# Patient Record
Sex: Female | Born: 1965 | Race: White | Hispanic: No | Marital: Married | Smoking: Former smoker
Health system: Southern US, Community
[De-identification: ages and names within clinical notes are randomized; demographics above are authoritative.]

## PROBLEM LIST (undated history)

## (undated) DIAGNOSIS — E119 Type 2 diabetes mellitus without complications: Secondary | ICD-10-CM

## (undated) DIAGNOSIS — F419 Anxiety disorder, unspecified: Secondary | ICD-10-CM

## (undated) DIAGNOSIS — E785 Hyperlipidemia, unspecified: Secondary | ICD-10-CM

## (undated) DIAGNOSIS — K219 Gastro-esophageal reflux disease without esophagitis: Secondary | ICD-10-CM

## (undated) DIAGNOSIS — I10 Essential (primary) hypertension: Secondary | ICD-10-CM

## (undated) DIAGNOSIS — O24419 Gestational diabetes mellitus in pregnancy, unspecified control: Secondary | ICD-10-CM

## (undated) HISTORY — PX: ABDOMINAL HYSTERECTOMY: SHX81

## (undated) HISTORY — DX: Anxiety disorder, unspecified: F41.9

## (undated) HISTORY — DX: Gestational diabetes mellitus in pregnancy, unspecified control: O24.419

## (undated) HISTORY — DX: Hyperlipidemia, unspecified: E78.5

## (undated) HISTORY — DX: Gastro-esophageal reflux disease without esophagitis: K21.9

---

## 1991-10-02 DIAGNOSIS — O24419 Gestational diabetes mellitus in pregnancy, unspecified control: Secondary | ICD-10-CM

## 1991-10-02 HISTORY — DX: Gestational diabetes mellitus in pregnancy, unspecified control: O24.419

## 1997-10-01 DIAGNOSIS — I10 Essential (primary) hypertension: Secondary | ICD-10-CM

## 1997-10-01 HISTORY — PX: ABDOMINAL HYSTERECTOMY: SHX81

## 1997-10-01 HISTORY — DX: Essential (primary) hypertension: I10

## 2005-10-12 ENCOUNTER — Emergency Department (HOSPITAL_COMMUNITY): Admission: EM | Admit: 2005-10-12 | Discharge: 2005-10-12 | Payer: Self-pay | Admitting: Emergency Medicine

## 2005-10-31 ENCOUNTER — Emergency Department (HOSPITAL_COMMUNITY): Admission: EM | Admit: 2005-10-31 | Discharge: 2005-10-31 | Payer: Self-pay | Admitting: Emergency Medicine

## 2005-11-19 ENCOUNTER — Emergency Department (HOSPITAL_COMMUNITY): Admission: EM | Admit: 2005-11-19 | Discharge: 2005-11-19 | Payer: Self-pay | Admitting: Emergency Medicine

## 2005-11-20 ENCOUNTER — Inpatient Hospital Stay (HOSPITAL_COMMUNITY): Admission: RE | Admit: 2005-11-20 | Discharge: 2005-11-22 | Payer: Self-pay | Admitting: Psychiatry

## 2005-11-20 ENCOUNTER — Ambulatory Visit: Payer: Self-pay | Admitting: Psychiatry

## 2005-12-03 ENCOUNTER — Inpatient Hospital Stay (HOSPITAL_COMMUNITY): Admission: RE | Admit: 2005-12-03 | Discharge: 2005-12-05 | Payer: Self-pay | Admitting: Psychiatry

## 2005-12-03 ENCOUNTER — Emergency Department (HOSPITAL_COMMUNITY): Admission: EM | Admit: 2005-12-03 | Discharge: 2005-12-03 | Payer: Self-pay | Admitting: Emergency Medicine

## 2005-12-04 ENCOUNTER — Ambulatory Visit: Payer: Self-pay | Admitting: Psychiatry

## 2006-01-18 ENCOUNTER — Emergency Department (HOSPITAL_COMMUNITY): Admission: EM | Admit: 2006-01-18 | Discharge: 2006-01-18 | Payer: Self-pay | Admitting: Emergency Medicine

## 2006-02-17 ENCOUNTER — Emergency Department (HOSPITAL_COMMUNITY): Admission: EM | Admit: 2006-02-17 | Discharge: 2006-02-17 | Payer: Self-pay | Admitting: Emergency Medicine

## 2006-02-22 ENCOUNTER — Emergency Department (HOSPITAL_COMMUNITY): Admission: EM | Admit: 2006-02-22 | Discharge: 2006-02-22 | Payer: Self-pay | Admitting: Emergency Medicine

## 2006-02-23 ENCOUNTER — Emergency Department (HOSPITAL_COMMUNITY): Admission: EM | Admit: 2006-02-23 | Discharge: 2006-02-24 | Payer: Self-pay | Admitting: Emergency Medicine

## 2006-02-24 ENCOUNTER — Inpatient Hospital Stay (HOSPITAL_COMMUNITY): Admission: EM | Admit: 2006-02-24 | Discharge: 2006-02-27 | Payer: Self-pay | Admitting: Emergency Medicine

## 2006-05-14 ENCOUNTER — Emergency Department (HOSPITAL_COMMUNITY): Admission: EM | Admit: 2006-05-14 | Discharge: 2006-05-14 | Payer: Self-pay | Admitting: Emergency Medicine

## 2006-05-17 ENCOUNTER — Emergency Department (HOSPITAL_COMMUNITY): Admission: EM | Admit: 2006-05-17 | Discharge: 2006-05-17 | Payer: Self-pay | Admitting: Emergency Medicine

## 2006-06-01 ENCOUNTER — Ambulatory Visit: Payer: Self-pay | Admitting: Psychiatry

## 2006-06-01 ENCOUNTER — Inpatient Hospital Stay (HOSPITAL_COMMUNITY): Admission: AD | Admit: 2006-06-01 | Discharge: 2006-06-03 | Payer: Self-pay | Admitting: Psychiatry

## 2006-07-02 ENCOUNTER — Inpatient Hospital Stay (HOSPITAL_COMMUNITY): Admission: EM | Admit: 2006-07-02 | Discharge: 2006-07-04 | Payer: Self-pay | Admitting: Emergency Medicine

## 2007-03-03 ENCOUNTER — Ambulatory Visit: Payer: Self-pay | Admitting: Family Medicine

## 2007-03-04 ENCOUNTER — Ambulatory Visit: Payer: Self-pay | Admitting: *Deleted

## 2007-05-05 ENCOUNTER — Ambulatory Visit: Payer: Self-pay | Admitting: Family Medicine

## 2007-05-05 LAB — CONVERTED CEMR LAB: Uric Acid, Serum: 6.4 mg/dL (ref 2.4–7.0)

## 2007-07-03 ENCOUNTER — Emergency Department (HOSPITAL_COMMUNITY): Admission: EM | Admit: 2007-07-03 | Discharge: 2007-07-03 | Payer: Self-pay | Admitting: Emergency Medicine

## 2007-11-13 ENCOUNTER — Ambulatory Visit: Payer: Self-pay | Admitting: Family Medicine
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
## 2007-12-24 ENCOUNTER — Emergency Department (HOSPITAL_COMMUNITY): Admission: EM | Admit: 2007-12-24 | Discharge: 2007-12-25 | Payer: Self-pay | Admitting: Emergency Medicine

## 2008-01-13 ENCOUNTER — Encounter (INDEPENDENT_AMBULATORY_CARE_PROVIDER_SITE_OTHER): Payer: Self-pay | Admitting: Family Medicine

## 2008-01-13 ENCOUNTER — Ambulatory Visit: Payer: Self-pay | Admitting: Internal Medicine

## 2008-01-13 LAB — CONVERTED CEMR LAB
Hep A Total Ab: NEGATIVE
Hep B S Ab: NEGATIVE

## 2008-05-17 ENCOUNTER — Emergency Department (HOSPITAL_COMMUNITY): Admission: EM | Admit: 2008-05-17 | Discharge: 2008-05-17 | Payer: Self-pay | Admitting: Emergency Medicine

## 2008-07-20 ENCOUNTER — Emergency Department (HOSPITAL_COMMUNITY): Admission: EM | Admit: 2008-07-20 | Discharge: 2008-07-20 | Payer: Self-pay | Admitting: Emergency Medicine

## 2008-08-12 ENCOUNTER — Emergency Department (HOSPITAL_COMMUNITY): Admission: EM | Admit: 2008-08-12 | Discharge: 2008-08-12 | Payer: Self-pay | Admitting: Emergency Medicine

## 2008-10-22 ENCOUNTER — Ambulatory Visit: Payer: Self-pay | Admitting: Interventional Radiology

## 2008-10-22 ENCOUNTER — Emergency Department (HOSPITAL_BASED_OUTPATIENT_CLINIC_OR_DEPARTMENT_OTHER): Admission: EM | Admit: 2008-10-22 | Discharge: 2008-10-22 | Payer: Self-pay | Admitting: Emergency Medicine

## 2009-02-12 ENCOUNTER — Emergency Department (HOSPITAL_BASED_OUTPATIENT_CLINIC_OR_DEPARTMENT_OTHER): Admission: EM | Admit: 2009-02-12 | Discharge: 2009-02-12 | Payer: Self-pay | Admitting: Emergency Medicine

## 2009-03-04 ENCOUNTER — Ambulatory Visit: Payer: Self-pay | Admitting: Family Medicine

## 2009-04-15 ENCOUNTER — Ambulatory Visit: Payer: Self-pay | Admitting: Family Medicine

## 2009-06-20 ENCOUNTER — Ambulatory Visit: Payer: Self-pay | Admitting: Family Medicine

## 2009-06-20 LAB — CONVERTED CEMR LAB
ALT: 65 units/L — ABNORMAL HIGH (ref 0–35)
CO2: 26 meq/L (ref 19–32)
Calcium: 9.8 mg/dL (ref 8.4–10.5)
Chloride: 101 meq/L (ref 96–112)
Cholesterol: 265 mg/dL — ABNORMAL HIGH (ref 0–200)
Glucose, Bld: 122 mg/dL — ABNORMAL HIGH (ref 70–99)
Sodium: 140 meq/L (ref 135–145)
Total Bilirubin: 1.2 mg/dL (ref 0.3–1.2)
Total Protein: 7.5 g/dL (ref 6.0–8.3)
Triglycerides: 439 mg/dL — ABNORMAL HIGH (ref <150)

## 2009-07-15 ENCOUNTER — Encounter (INDEPENDENT_AMBULATORY_CARE_PROVIDER_SITE_OTHER): Payer: Self-pay | Admitting: Family Medicine

## 2009-07-15 ENCOUNTER — Ambulatory Visit: Payer: Self-pay | Admitting: Family Medicine

## 2009-07-15 LAB — CONVERTED CEMR LAB
ALT: 36 units/L — ABNORMAL HIGH (ref 0–35)
AST: 31 units/L (ref 0–37)
Alkaline Phosphatase: 86 units/L (ref 39–117)
Bilirubin, Direct: 0.1 mg/dL (ref 0.0–0.3)
Indirect Bilirubin: 0.7 mg/dL (ref 0.0–0.9)
Total Bilirubin: 0.8 mg/dL (ref 0.3–1.2)

## 2009-07-25 ENCOUNTER — Ambulatory Visit (HOSPITAL_COMMUNITY): Admission: RE | Admit: 2009-07-25 | Discharge: 2009-07-25 | Payer: Self-pay | Admitting: Internal Medicine

## 2009-08-01 ENCOUNTER — Encounter: Admission: RE | Admit: 2009-08-01 | Discharge: 2009-08-01 | Payer: Self-pay | Admitting: Internal Medicine

## 2009-08-03 ENCOUNTER — Inpatient Hospital Stay (HOSPITAL_COMMUNITY): Admission: EM | Admit: 2009-08-03 | Discharge: 2009-08-06 | Payer: Self-pay | Admitting: Emergency Medicine

## 2009-09-27 ENCOUNTER — Ambulatory Visit: Payer: Self-pay | Admitting: Family Medicine

## 2009-10-10 ENCOUNTER — Ambulatory Visit: Payer: Self-pay | Admitting: Family Medicine

## 2009-11-29 ENCOUNTER — Ambulatory Visit: Payer: Self-pay | Admitting: Family Medicine

## 2009-11-29 LAB — CONVERTED CEMR LAB
Alkaline Phosphatase: 81 units/L (ref 39–117)
BUN: 10 mg/dL (ref 6–23)
CO2: 21 meq/L (ref 19–32)
Cholesterol: 286 mg/dL — ABNORMAL HIGH (ref 0–200)
Creatinine, Ser: 0.55 mg/dL (ref 0.40–1.20)
Glucose, Bld: 110 mg/dL — ABNORMAL HIGH (ref 70–99)
HDL: 49 mg/dL (ref >39)
LDL Cholesterol: 172 mg/dL — ABNORMAL HIGH (ref 0–99)
Sodium: 138 meq/L (ref 135–145)
Total Bilirubin: 0.7 mg/dL (ref 0.3–1.2)
Total Protein: 7.1 g/dL (ref 6.0–8.3)
Triglycerides: 326 mg/dL — ABNORMAL HIGH (ref <150)
VLDL: 65 mg/dL — ABNORMAL HIGH (ref 0–40)

## 2009-12-13 ENCOUNTER — Ambulatory Visit: Payer: Self-pay | Admitting: Family Medicine

## 2010-04-10 ENCOUNTER — Emergency Department (HOSPITAL_BASED_OUTPATIENT_CLINIC_OR_DEPARTMENT_OTHER): Admission: EM | Admit: 2010-04-10 | Discharge: 2010-04-10 | Payer: Self-pay | Admitting: Emergency Medicine

## 2010-04-24 ENCOUNTER — Emergency Department (HOSPITAL_BASED_OUTPATIENT_CLINIC_OR_DEPARTMENT_OTHER): Admission: EM | Admit: 2010-04-24 | Discharge: 2010-04-24 | Payer: Self-pay | Admitting: Emergency Medicine

## 2010-05-17 ENCOUNTER — Other Ambulatory Visit: Payer: Self-pay | Admitting: Emergency Medicine

## 2010-05-17 ENCOUNTER — Inpatient Hospital Stay (HOSPITAL_COMMUNITY): Admission: EM | Admit: 2010-05-17 | Discharge: 2010-05-20 | Payer: Self-pay | Admitting: Internal Medicine

## 2010-09-23 IMAGING — CR DG CHEST 2V
2 series · 2 of 2 positions shown · non-contrast
Comparison: 07/02/2006

CLINICAL DATA: Cough and fever

CHEST - 2 VIEW

[w chest pa]
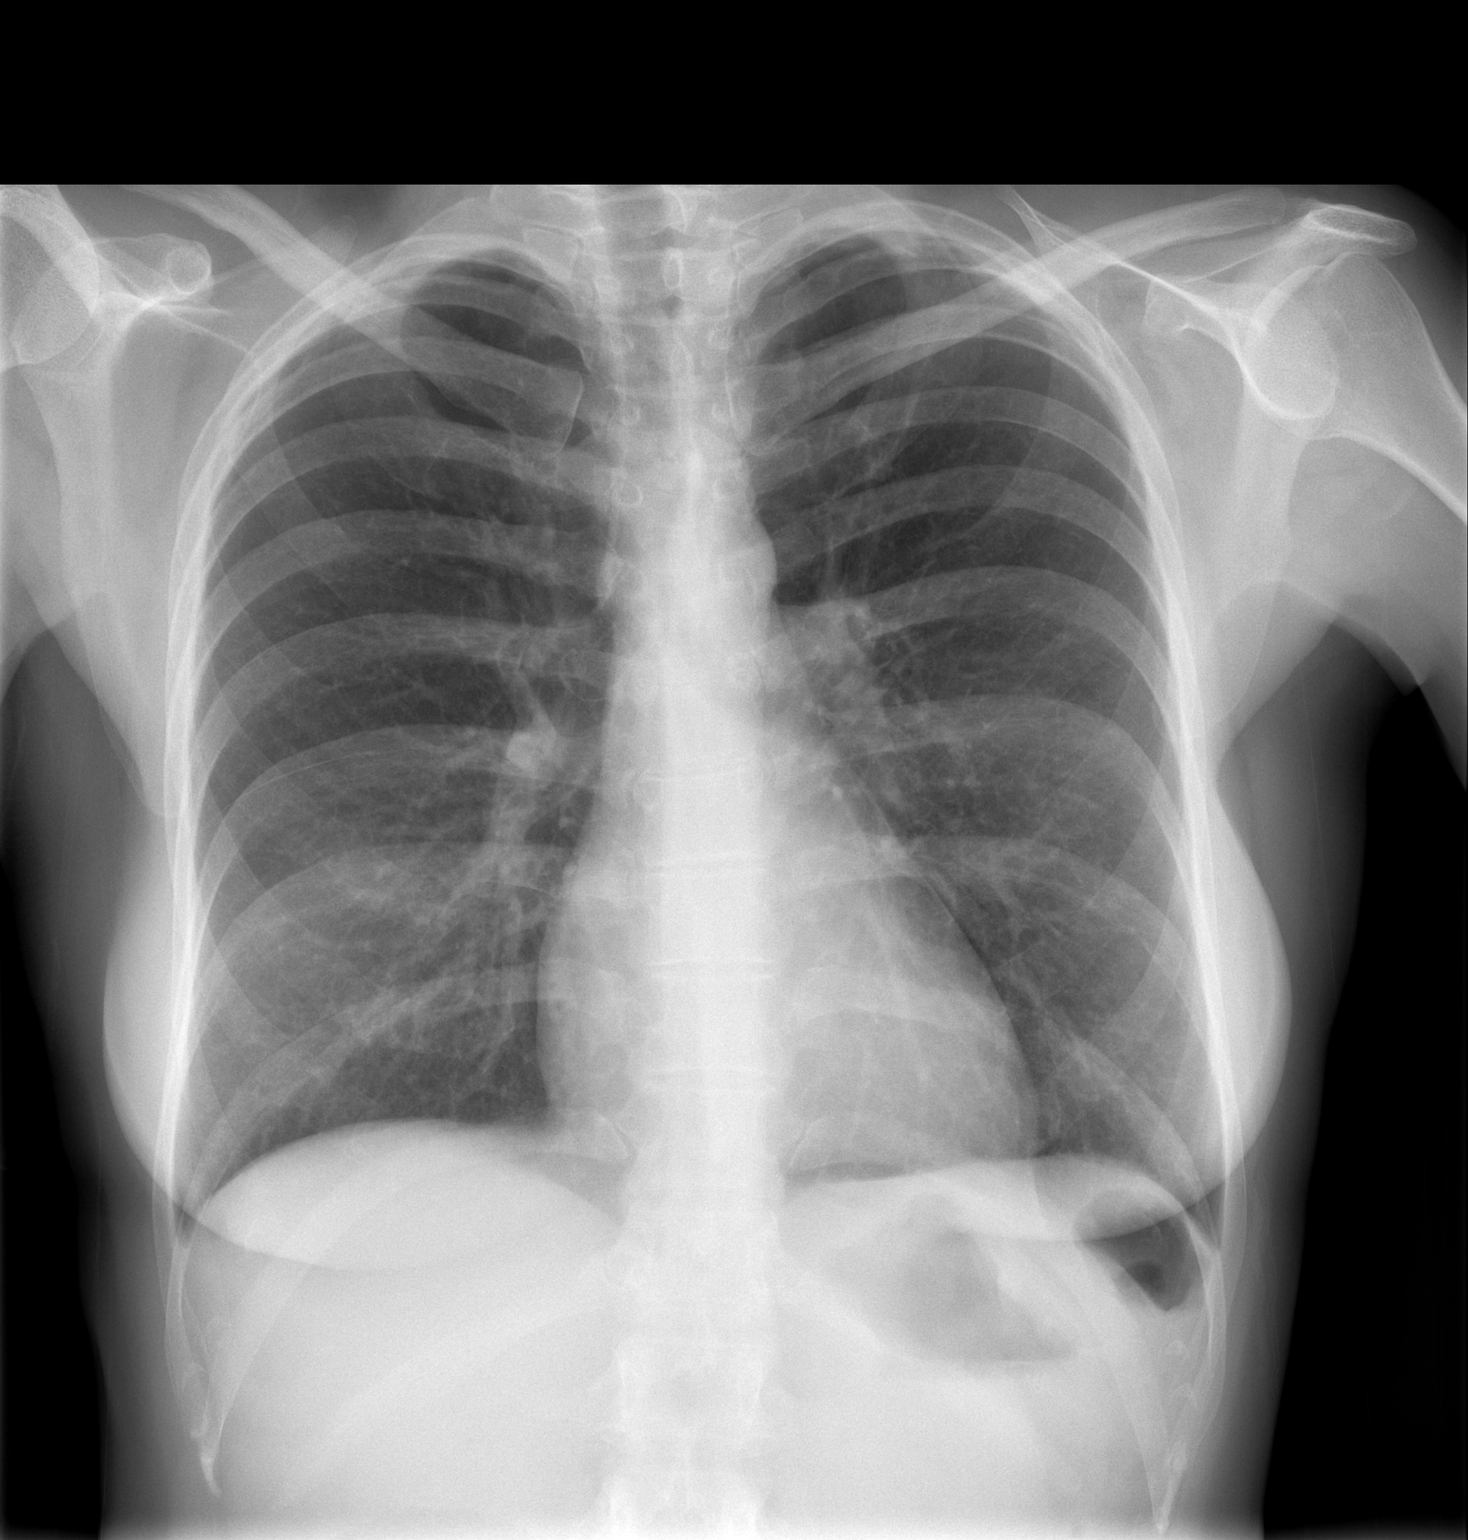

[w chest lat]
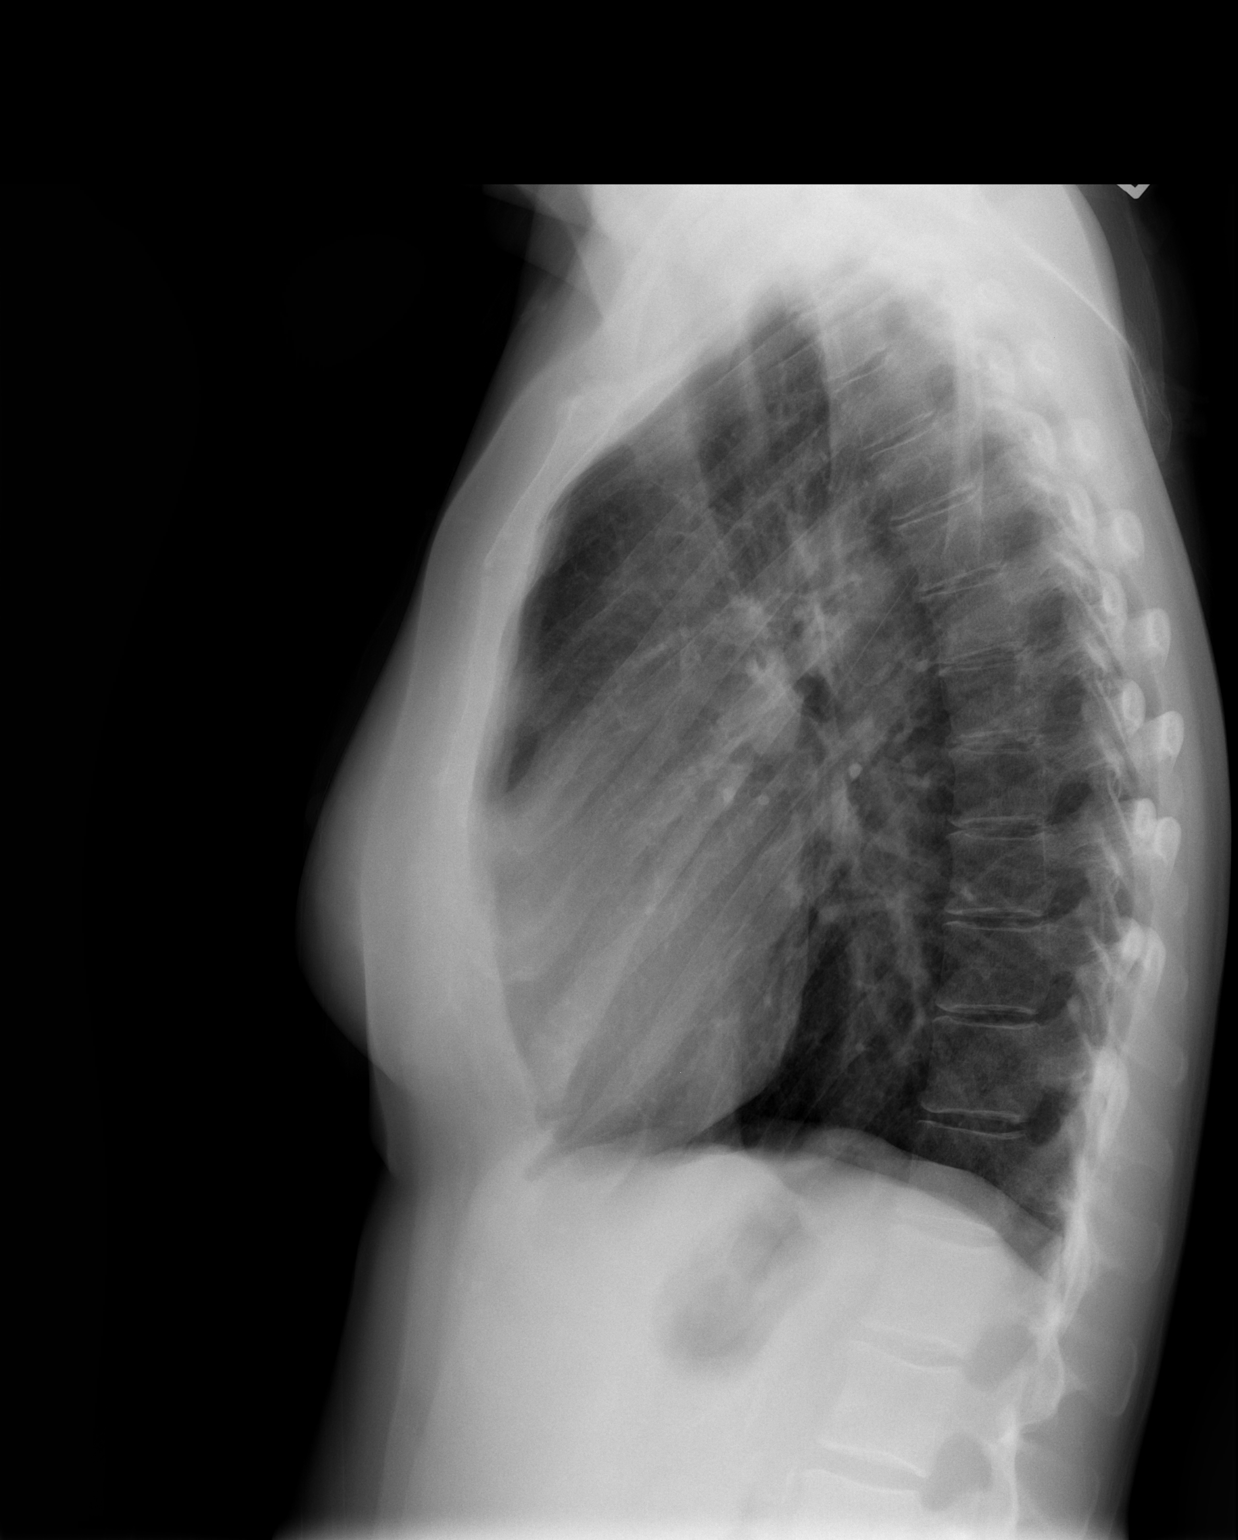

[2 of 2 positions shown; findings below may reference images not displayed]

FINDINGS: The heart is normal in size.  Lungs are clear.  No
pneumothorax or effusion.  Hyperaeration.
IMPRESSION: Hyperaeration.  Clear lungs.

## 2010-10-22 ENCOUNTER — Encounter: Payer: Self-pay | Admitting: Internal Medicine

## 2010-12-15 LAB — CBC
MCH: 34.7 pg — ABNORMAL HIGH (ref 26.0–34.0)
MCH: 34.7 pg — ABNORMAL HIGH (ref 26.0–34.0)
MCHC: 34.6 g/dL (ref 30.0–36.0)
Platelets: 154 10*3/uL (ref 150–400)
Platelets: 245 10*3/uL (ref 150–400)
RBC: 4.55 MIL/uL (ref 3.87–5.11)
WBC: 18 10*3/uL — ABNORMAL HIGH (ref 4.0–10.5)

## 2010-12-15 LAB — COMPREHENSIVE METABOLIC PANEL
ALT: 30 U/L (ref 0–35)
ALT: 42 U/L — ABNORMAL HIGH (ref 0–35)
AST: 31 U/L (ref 0–37)
AST: 49 U/L — ABNORMAL HIGH (ref 0–37)
Albumin: 3.5 g/dL (ref 3.5–5.2)
Albumin: 5 g/dL (ref 3.5–5.2)
Alkaline Phosphatase: 160 U/L — ABNORMAL HIGH (ref 39–117)
BUN: 20 mg/dL (ref 6–23)
CO2: 22 mEq/L (ref 19–32)
Calcium: 7.5 mg/dL — ABNORMAL LOW (ref 8.4–10.5)
Calcium: 9.6 mg/dL (ref 8.4–10.5)
Chloride: 102 mEq/L (ref 96–112)
Creatinine, Ser: 0.74 mg/dL (ref 0.4–1.2)
Creatinine, Ser: 0.9 mg/dL (ref 0.4–1.2)
GFR calc Af Amer: >60 mL/min (ref >60)
GFR calc Af Amer: >60 mL/min (ref >60)
GFR calc non Af Amer: >60 mL/min (ref >60)
Glucose, Bld: 129 mg/dL — ABNORMAL HIGH (ref 70–99)
Potassium: 3 mEq/L — ABNORMAL LOW (ref 3.5–5.1)
Sodium: 140 mEq/L (ref 135–145)
Sodium: 146 mEq/L — ABNORMAL HIGH (ref 135–145)
Total Bilirubin: 0.9 mg/dL (ref 0.3–1.2)
Total Protein: 6.2 g/dL (ref 6.0–8.3)
Total Protein: 8.8 g/dL — ABNORMAL HIGH (ref 6.0–8.3)

## 2010-12-15 LAB — BASIC METABOLIC PANEL
BUN: 2 mg/dL — ABNORMAL LOW (ref 6–23)
BUN: 5 mg/dL — ABNORMAL LOW (ref 6–23)
CO2: 21 mEq/L (ref 19–32)
CO2: 25 mEq/L (ref 19–32)
Calcium: 7.4 mg/dL — ABNORMAL LOW (ref 8.4–10.5)
Calcium: 7.9 mg/dL — ABNORMAL LOW (ref 8.4–10.5)
Chloride: 104 mEq/L (ref 96–112)
Chloride: 105 mEq/L (ref 96–112)
Creatinine, Ser: 0.47 mg/dL (ref 0.4–1.2)
Creatinine, Ser: 0.64 mg/dL (ref 0.4–1.2)
GFR calc Af Amer: >60 mL/min (ref >60)
GFR calc Af Amer: >60 mL/min (ref >60)
GFR calc non Af Amer: >60 mL/min (ref >60)
GFR calc non Af Amer: >60 mL/min (ref >60)
Glucose, Bld: 159 mg/dL — ABNORMAL HIGH (ref 70–99)
Glucose, Bld: 63 mg/dL — ABNORMAL LOW (ref 70–99)
Potassium: 2.6 mEq/L — CL (ref 3.5–5.1)
Potassium: 3.3 mEq/L — ABNORMAL LOW (ref 3.5–5.1)
Sodium: 137 mEq/L (ref 135–145)
Sodium: 139 mEq/L (ref 135–145)

## 2010-12-15 LAB — RAPID URINE DRUG SCREEN, HOSP PERFORMED
Amphetamines: NOT DETECTED
Barbiturates: NOT DETECTED
Benzodiazepines: POSITIVE — AB
Opiates: POSITIVE — AB
Tetrahydrocannabinol: POSITIVE — AB

## 2010-12-15 LAB — POTASSIUM: Potassium: 3.8 mEq/L (ref 3.5–5.1)

## 2010-12-15 LAB — DIFFERENTIAL
Basophils Absolute: 0.2 10*3/uL — ABNORMAL HIGH (ref 0.0–0.1)
Basophils Relative: 1 % (ref 0–1)
Eosinophils Absolute: 0 10*3/uL (ref 0.0–0.7)
Eosinophils Relative: 0 % (ref 0–5)
Lymphocytes Relative: 7 % — ABNORMAL LOW (ref 12–46)
Lymphs Abs: 1.3 10*3/uL (ref 0.7–4.0)
Monocytes Absolute: 0.5 10*3/uL (ref 0.1–1.0)
Monocytes Relative: 3 % (ref 3–12)
Neutro Abs: 16 10*3/uL — ABNORMAL HIGH (ref 1.7–7.7)
Neutrophils Relative %: 89 % — ABNORMAL HIGH (ref 43–77)

## 2010-12-15 LAB — URINE MICROSCOPIC-ADD ON

## 2010-12-15 LAB — URINALYSIS, ROUTINE W REFLEX MICROSCOPIC
Bilirubin Urine: NEGATIVE
Ketones, ur: NEGATIVE mg/dL
Nitrite: NEGATIVE
Specific Gravity, Urine: 1.019 (ref 1.005–1.030)
Urobilinogen, UA: 1 mg/dL (ref 0.0–1.0)

## 2010-12-15 LAB — URINE CULTURE: Culture  Setup Time: 201108180500

## 2010-12-15 LAB — MAGNESIUM: Magnesium: 1.9 mg/dL (ref 1.5–2.5)

## 2010-12-15 LAB — LIPASE, BLOOD: Lipase: 2293 U/L — ABNORMAL HIGH (ref 23–300)

## 2011-01-03 LAB — LIPID PANEL
HDL: 50 mg/dL (ref >39)
Total CHOL/HDL Ratio: 4.5 RATIO
VLDL: 17 mg/dL (ref 0–40)

## 2011-01-03 LAB — COMPREHENSIVE METABOLIC PANEL
ALT: 43 U/L — ABNORMAL HIGH (ref 0–35)
ALT: 57 U/L — ABNORMAL HIGH (ref 0–35)
Albumin: 4.5 g/dL (ref 3.5–5.2)
Alkaline Phosphatase: 117 U/L (ref 39–117)
Alkaline Phosphatase: 96 U/L (ref 39–117)
BUN: 6 mg/dL (ref 6–23)
CO2: 27 mEq/L (ref 19–32)
Calcium: 9.2 mg/dL (ref 8.4–10.5)
GFR calc non Af Amer: >60 mL/min (ref >60)
Glucose, Bld: 118 mg/dL — ABNORMAL HIGH (ref 70–99)
Potassium: 3.2 mEq/L — ABNORMAL LOW (ref 3.5–5.1)
Potassium: 3.8 mEq/L (ref 3.5–5.1)
Sodium: 141 mEq/L (ref 135–145)
Sodium: 142 mEq/L (ref 135–145)
Total Bilirubin: 1 mg/dL (ref 0.3–1.2)
Total Protein: 6 g/dL (ref 6.0–8.3)
Total Protein: 7.3 g/dL (ref 6.0–8.3)

## 2011-01-03 LAB — DIFFERENTIAL
Basophils Absolute: 0 10*3/uL (ref 0.0–0.1)
Basophils Relative: 0 % (ref 0–1)
Eosinophils Absolute: 0 10*3/uL (ref 0.0–0.7)
Eosinophils Absolute: 0 10*3/uL (ref 0.0–0.7)
Lymphocytes Relative: 10 % — ABNORMAL LOW (ref 12–46)
Lymphs Abs: 1.5 10*3/uL (ref 0.7–4.0)
Monocytes Absolute: 0.4 10*3/uL (ref 0.1–1.0)
Monocytes Absolute: 0.4 10*3/uL (ref 0.1–1.0)
Monocytes Relative: 3 % (ref 3–12)
Neutro Abs: 15.5 10*3/uL — ABNORMAL HIGH (ref 1.7–7.7)
Neutrophils Relative %: 87 % — ABNORMAL HIGH (ref 43–77)

## 2011-01-03 LAB — URINALYSIS, ROUTINE W REFLEX MICROSCOPIC
Glucose, UA: NEGATIVE mg/dL
Leukocytes, UA: NEGATIVE
Specific Gravity, Urine: 1.027 (ref 1.005–1.030)
pH: 6.5 (ref 5.0–8.0)

## 2011-01-03 LAB — CBC
HCT: 37.7 % (ref 36.0–46.0)
Hemoglobin: 13.1 g/dL (ref 12.0–15.0)
Platelets: 246 10*3/uL (ref 150–400)
RBC: 3.76 MIL/uL — ABNORMAL LOW (ref 3.87–5.11)
RDW: 13.5 % (ref 11.5–15.5)
RDW: 13.6 % (ref 11.5–15.5)

## 2011-01-03 LAB — URINE MICROSCOPIC-ADD ON

## 2011-01-03 LAB — AMYLASE: Amylase: 267 U/L — ABNORMAL HIGH (ref 27–131)

## 2011-01-03 LAB — ETHANOL: Alcohol, Ethyl (B): <5 mg/dL (ref 0–10)

## 2011-01-03 LAB — MAGNESIUM: Magnesium: 2.2 mg/dL (ref 1.5–2.5)

## 2011-01-03 LAB — LIPASE, BLOOD
Lipase: 32 U/L (ref 11–59)
Lipase: 521 U/L — ABNORMAL HIGH (ref 11–59)

## 2011-02-16 NOTE — Discharge Summary (Signed)
NAMEMODEST, DRAEGER               ACCOUNT NO.:  [id number]   MEDICAL RECORD NO.:  [id number]          PATIENT TYPE:  IPS   LOCATION:  0300                          [facility]ILITY[facility]  [facility]   PHYSICIAN:  Anselm Jungling, MD  DATE OF BIRTH:  1966/03/10   DATE OF ADMISSION:  06/01/2006  DATE OF DISCHARGE:  06/03/2006                                 DISCHARGE SUMMARY   IDENTIFYING DATA/REASON FOR ADMISSION:  The patient is a 45 year old,  unmarried woman who was admitted to the inpatient service for treatment of  depression and alcohol dependence.  This was not her first inpatient  admission for these diagnoses.  Please refer to the admission note for  further details pertaining to the symptoms, circumstances and history that  led to her hospitalization.  She indicated that she had previously done well  on a regimen of Lexapro, but had been unable to afford her medication, had  gone off it and had again become quite depressed.  She also admitted to  continuing to drink in alcoholic fashion, although she tended to minimize  the importance of this in her overall difficulties.   She was given initial Axis I diagnoses of alcohol dependence, mood disorder,  NOS and rule out substance-induced mood disorder.   MEDICAL/LABORATORY:  The patient was medically and physically assessed by  the psychiatric nurse practitioner.  There were no acute medical issues.   HOSPITAL COURSE:  The patient was admitted to the adult inpatient  psychiatric service.  On September 1, and September 2, she saw Dr. Geoffery Lyons  psychiatric attending physician and the undersigned on June 03, 2006, the date of discharge.   The patient presented as a slender, normally developed, adult female who  appeared older than her chronological age of 47.  She was pleasant,  cooperative and well-organized.   She was started on a Librium detoxification protocol and had minimal  detoxification symptoms with Librium on board.  She  was restarted on her  previous antidepressant, Lexapro, at a dose of 10 mg daily.   She had also come to [place] with a history of hypertension and was placed on an  equivalent of Benicar 20/12.5 mg daily.  She was given trazodone for sleep,  but this was unhelpful to her.   On hospital day #3, the patient insisted that she needed to be discharge,  stating that she felt that she was through her detoxification process and  that she needed to return to work the following day.  She was absent  suicidal ideation and indicated that she was willing to follow up with  appropriate aftercare.   AFTERCARE:  The patient was to follow up at the [facility] for  medication management.  Because she was discharged on Labor Day holiday, the  case manager would need to call the patient back with specific appointment  times and dates on the day following, Tuesday June 04, 2006.   DISCHARGE MEDICATIONS:  1. Lexapro 10 mg daily.  2. Benicar 20/12.5 daily.  3. Vistaril 50 mg nightly p.r.n. insomnia.   DISCHARGE DIAGNOSES:  AXIS I:  Alcohol dependence.  Mood disorder, not otherwise specified.  AXIS II:  Deferred.  AXIS III:  Hypertension.  AXIS IV:  Stressors severe.  AXIS V:  GAF on discharge 65.      Anselm Jungling, MD  Electronically Signed     SPB/MEDQ  D:  06/03/2006  T:  06/03/2006  Job:  [id number]

## 2011-02-16 NOTE — H&P (Signed)
[name], [name]               ACCOUNT NO.:  [id number]   MEDICAL RECORD NO.:  [id number]          PATIENT TYPE:  INP   LOCATION:  2006                         FACILITY:  [facility]   PHYSICIAN:  Lucita Ferrara, MD         DATE OF BIRTH:  1966/06/06   DATE OF ADMISSION:  07/02/2006  DATE OF DISCHARGE:                                HISTORY & PHYSICAL   HISTORY OF PRESENT ILLNESS:  The patient is a 45 year old female with a past  medical history significant for alcohol abuse who presents to [facility]  emergency room with complaints of nausea and vomiting starting in the  morning.  She said that she had decreased her alcohol intake, although she  drank about 1 liter of wine.  She says that she usually drinks vodka.  The  vomiting was non-bilious, non-bloody.  She denies any dizziness. She denies  any changes in bowel or bladder habits.  She denies any incontinence.  She  denies any foaming of the mouth.  She denies any seizures.  She also denies  any recent history of seizures.  She denies any fever or chills.  No chest  pain, shortness of breath or abdominal pain.  She denies any cough,  diarrhea, dyspepsia, or dysuria.   PAST MEDICAL HISTORY:  1. Hypertension.  2. Alcohol abuse.  3. Anxiety syndrome.   SOCIAL HISTORY:  The patient is a heavy drinker.  She smokes cigarettes,  about a pack a day.  She denies any drug abuse.   FAMILY HISTORY:  Noncontributory.   PAST SURGICAL HISTORY:  None.   REVIEW OF SYSTEMS:  As above.   PHYSICAL EXAMINATION:  VITAL SIGNS:  Temperature 100.8, blood pressure  133/84, pulse 109, respirations 20, pulse oximetry 95% on room air.  GENERAL:  The patient is in no acute distress.  HEENT:  Normocephalic and atraumatic.  Sclerae are anicteric.  Mucous  membranes are moist.  NECK:  Supple.  No thyromegaly.  No masses.  CARDIOVASCULAR:  S1 and S2.  Regular rate and rhythm.  No murmurs, rubs or  clicks.  ABDOMEN:  Soft, nontender, nondistended.  Positive  bowel sounds.  Guaiac  negative.  LUNGS:  Clear to auscultation bilaterally.  No wheezes, no rhonchi.  EXTREMITIES:  No clubbing, cyanosis, or edema.  NEUROLOGIC:  Alert and oriented x3.  Other neurologic exam could not be  assessed, as the patient is intoxicated.   LABORATORY DATA:  White count 6.9, hemoglobin 12.4, hematocrit 35.6,  platelets 105.  Urinalysis shows ketones greater than 80.  Sodium 136,  potassium 3.9, chloride 94, CO2 of 23, glucose of 123, BUN 10, creatinine  0.6, calcium 8.9, protein of 7.1, albumin 3.7.  AST of 111, ALT 112,  alkaline phosphatase of 138.  Bilirubin 0.9.  Lipase 28.  Alcohol level was  drawn, and alcohol level was 65, which was high.  PT of 13, INR 1.  Cardiac  enzymes negative.  EKG normal.  Urine drug screen was positive for  benzodiazepines, negative for cocaine, negative for opiates, negative for  barbiturates.   ASSESSMENT  AND PLAN:  1. This is a 45 year old female with alcohol abuse who presents with acute      alcohol intoxication, possibly withdrawal, as the patient also admits      that she has been trying to quit alcohol intake.  It is possible that      even though her blood alcohol level was 60, she probably may run in the      high range, and she actually may be withdrawing.  Will go ahead and      admit for alcohol withdrawal, and go ahead through withdrawal      precautions including neurologic checks.  Start on IV fluids with a      banana bag.  In addition, will start with Librium p.r.n. around-the-      clock.  In addition, will go ahead and start IV hydration.  2. For hypertension, will go ahead and start Benicar.  3. For history of depression, will go ahead and continue Lexapro.      Lucita Ferrara, MD  Electronically Signed     RR/MEDQ  D:  07/03/2006  T:  07/03/2006  Job:  [id number]
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
## 2011-02-16 NOTE — Discharge Summary (Signed)
[name]               ACCOUNT NO.:  [id number]   MEDICAL RECORD NO.:  [id number]          PATIENT TYPE:  IPS   LOCATION:  0307                          [facility]ACILITY:  [facility]   PHYSICIAN:  Jeanice Lim, M.D. DATE OF BIRTH:  April 05, 1966   DATE OF ADMISSION:  11/20/2005  DATE OF DISCHARGE:  11/22/2005                                 DISCHARGE SUMMARY   IDENTIFYING DATA:  This is a 45 year old separated Caucasian female, [name], voluntarily admitted with a history of depression, overwhelmed,  drinking wine, one liter a day.  Last drink was on February 19th.  Daily  increased since end of January.  Had tried to stop on her own.  Gets shaky,  DT symptoms, has been losing weight, questionable seizure five months ago  after being detoxed.  Has been drinking for years.  Longest period of  sobriety was six months.  Followed by Dr. Evelene Croon.  History of detox five years  ago at [facility].  In the past, on Paxil, Zoloft, caused nausea.   MEDICATIONS:  Benicar (?), Lexapro (off for one week, on for four weeks).   ALLERGIES:  No known drug allergies.   PHYSICAL EXAMINATION:  Physical and neurologic exam essentially within  normal limits.   LABORATORY DATA:  Routine admission labs within normal limits.   REVIEW OF SYSTEMS:  Essentially negative.   MENTAL STATUS EXAM:  Fully alert, cooperative, casually dressed.  Speech  clear.  Mood anxious, flat.  Thought processes goal directed.  No evidence  of psychosis or psychotic symptoms. Cognitively intact.  Judgment and  insight are fair.  Poor impulse control.   ADMISSION DIAGNOSES:  AXIS I:  Alcohol dependence.  Depressive disorder not  otherwise specified.  Rule out major depressive disorder, recurrent,  moderate.  AXIS II:  Deferred.  AXIS III:  Hypertension.  AXIS IV:  Moderate (problems with primary support group, economic issues,  other psychosocial stressors).  AXIS V:  35/55.   HOSPITAL COURSE:  The patient was admitted  and ordered routine p.r.n.  medications and underwent further monitoring.  Was placed on safety checks  and CIWAs were checked regarding withdrawal, placed on Librium detox  protocol for safe withdrawal, monitoring for seizures and other DT symptoms.  The patient was stabilized and tolerated detox without complications.   CONDITION ON DISCHARGE:  Discharged in improved condition with no acute  withdrawal symptoms.  Motivated to remain sober, showing improved judgment  and insight and coping skills, participating in the aftercare plan.  She was  given medication education at the time of discharge.   DISCHARGE MEDICATIONS:  1.  Lexapro 10 mg daily.  2.  Trazodone 50 mg, 1-1/2 at 9 p.m.  3.  Librium 25 mg, 1 twice a day for one day and then 1 at 8 p.m. for two      days and then stop.  4.  Resume blood pressure medications including Benicar daily as previously      prescribed.   FOLLOW UP:  She is to follow up with psychiatrist and therapist within 7-10  days for  medication monitoring, individual therapy and substance abuse  treatment.  Had follow-up with Dr. Evelene Croon on Tuesday, November 27, 2005 at  12:30 p.m. and [facility] on Tuesday, November 27, 2005 at 9:40  a.m.   DISCHARGE DIAGNOSES:  AXIS I:  Alcohol dependence.  Depressive disorder not  otherwise specified.  Rule out major depressive disorder, recurrent,  moderate.  AXIS II:  Deferred.  AXIS III:  Hypertension.  AXIS IV:  Moderate (problems with primary support group, economic issues,  other psychosocial stressors).  AXIS V:  GAF on discharge 55.   The patient was improved.      Jeanice Lim, M.D.  Electronically Signed     JEM/MEDQ  D:  12/15/2005  T:  12/16/2005  Job:  [id number]

## 2011-02-16 NOTE — H&P (Signed)
NAMEJHENE, WESTMORELAND               ACCOUNT NO.:  [id number]   MEDICAL RECORD NO.:  [id number]          PATIENT TYPE:  OBV   LOCATION:  1830                         FACILITY:  [facility]   PHYSICIAN:  Melissa L. Ladona Ridgel, MD  DATE OF BIRTH:  [birth year]   DATE OF ADMISSION:  02/24/2006  DATE OF DISCHARGE:                                HISTORY & PHYSICAL   CHIEF COMPLAINT:  Tremors and witnessed seizure activity at the walk-in  clinic.   PRIMARY CARE PHYSICIAN:  Scientist, physiological.   HISTORY OF PRESENT ILLNESS:  The patient is a 45 year old white female  presented to the [facility] today with increasing tremor.  The  patient reported to them that she had not been drinking but then developed  onset of loss of consciousness x2 minutes consistent with seizure activity.  The patient was brought to the emergency room for further evaluation.  The  patient was noted to have been in the emergency room last Friday with an  alcohol level in the 400s.  She also had some sort of a trauma to her right  orbit and back which she states is secondary to falling off a chair while  fixing a blind in her apartment.  The patient initially stated she did not  want to be admitted but then agreed to stay at least one night for  evaluation.  The patient exhibited intermittent confusion early on in the  emergency room stay.  At this time the patient has no complaints other than  mild nausea.   REVIEW OF SYSTEMS:  Nausea, fever positive, no weight loss, no shortness of  breath, no chest pain, positive baseline tremor, the patient fell and struck  her head on a hanging blind and was noted earlier to be intermittently  confused.   PAST MEDICAL HISTORY:  Ethanol abuse with previous ethanol withdrawal  seizures.  Hypertension.   PAST SURGICAL HISTORY:  Hysterectomy.   SOCIAL HISTORY:  She drinks 4-5 glasses of wine a day, she smokes a pack of  cigarettes a day and in the past has smoked marijuana  but not recently.   FAMILY HISTORY:  Mom is living with hypertension.  Dad is living with  hypertension, tremor, diabetes, and CABG.   ALLERGIES:  NO KNOWN DRUG ALLERGIES.   MEDICATIONS:  1.  Benicar 20 mg once daily.  2.  Phenergan unknown dosing.   PHYSICAL EXAMINATION:  VITAL SIGNS:  Temperature is 101.3, blood pressure is  157/86, pulse of 93, respirations 24, saturation 97%.  GENERAL:  The patient is awake, alert but tremulous, no acute distress.  She  is normocephalic but with positive periorbital ecchymosis around the right  eye, there is no depression of the eyeball itself, no scleral icterus or  injection.  NECK:  Supple.  There is no JVD, no lymph nodes, and no carotid bruits.  CHEST:  Clear to auscultation with decreased breath sounds bilaterally.  There is ecchymosis over her whole left side but she is nontender.  CARDIOVASCULAR:  Regular rate and rhythm, positive S1 and S2, no S3, S4, no  murmurs,  rubs, or gallops.  ABDOMEN:  Soft, nontender, nondistended with positive bowel sounds.  EXTREMITIES:  No clubbing, cyanosis, or edema.  She is very thin.  NEUROLOGICAL:  She is awake, alert, oriented x3 but she is very shaky.  Her  pupils are dilated but equal and reactive.   LABORATORIES:  Her only laboratories are a urine drug screen which is  negative and a urinalysis which is negative.  EKG shows an incomplete right  bundle branch block with a heart rate of 95 beats per minute.  She has no  acute ST-T wave changes.   ASSESSMENT AND PLAN:  This is a 45 year old white female with ethanol  withdrawal status post witnessed seizure.  The patient also has had trauma  at least Friday night of the right eye and back, it is unclear whether this  is seizure related, the patient denies that but at this time she is not  clear on her story.   1.  Cardiovascular/hypertension.  We will continue her Benicar as long as      her creatinine is okay and we will use p.r.n. clonidine if  needed for      hypertension related to withdrawal.  2.  Pulmonary.  She has bruising over her right ribs on her back.  We will      check a PA and lateral chest x-ray to rule out aspiration pneumonia      especially in light of her elevated temperature.  3.  GI.  She has nausea and we will treat her symptomatically and then start      PPI and using Zofran.  4.  GU.  UA is negative so there is no evidence for infection at that site      and we will pursue her chest as the source for infection.  5.  Fever.  We will check a CBC to rule out leukocytosis and an x-ray to      rule out pneumonia related to aspiration.  There is no evidence for      urinary tract infection.  I will hold antibiotics until I see her chest      x-ray.  6.  BMET.  We will check that and replete any potassium.  7.  Endocrine.  We will check a TSH.      Melissa L. Ladona Ridgel, MD  Electronically Signed     MLT/MEDQ  D:  02/24/2006  T:  02/24/2006  Job:  [id number]   cc:   [facility]

## 2011-02-16 NOTE — Discharge Summary (Signed)
[name], [name]               ACCOUNT NO.:  [id number]   MEDICAL RECORD NO.:  [id number]          PATIENT TYPE:  IPS   LOCATION:  0305                          [facility]ACILITY:  [facility]   PHYSICIAN:  Jeanice Lim, M.D. DATE OF BIRTH:  Sep 14, 1966   DATE OF ADMISSION:  12/03/2005  DATE OF DISCHARGE:  12/05/2005                                 DISCHARGE SUMMARY   IDENTIFYING DATA:  This is a 45 year old Caucasian female, married,  separated.  Relapsed on alcohol 4 days after being leaving [facility] February 22 and attributes relapse to failure to follow AA program  and go to regular meetings, also arguing with husband.  They have separated  due to marital conflict.  No suicidal thoughts but drinking large amounts,  feeling out of control, endorsing depressed mood.  Second [facility] admission, history of detox at [facility].  Mother of  40 year old daughter.  Compliant with Lexapro.  No prior suicide attempts.  Has been drinking excessive amounts of alcohol, increased since difficulty  with marriage.  Followed by [facility], history of hypertension.   ADMISSION MEDICATIONS:  __________, diazapines at home, Benicar and Lexapro.   ALLERGIES:  No known drug allergies.   PHYSICAL AND NEUROLOGICAL EXAMINATION:  Within normal limits.   ROUTINE ADMISSION LABS:  Within normal limits.   MENTAL STATUS EXAM:  Fully alert, tremulous, anxious affect, mild tremor,  some irritability.  Speech within normal limits.  Anxious, depressed,  dysphoric.  Thought process goal directed, no suicidal or homicidal  ideation, judgment and insight were somewhat impaired, cognitively intact.  Impulse control poor by history.   ADMISSION DIAGNOSES:  AXIS I:  Alcohol dependence, depressive disorder not  otherwise specified, rule out substance-induced mood disorder superimposed  on major depression, moderate.  AXIS II:  None.  AXIS III:  Hypertension.  AXIS  IV:  Severe, with marital conflict, problems with primary support  group, life transition and financial stress.  AXIS V:  30/60.   HOSPITAL COURSE:  The patient was admitted and ordered routine p.r.n.  medications, underwent further monitoring, and was encouraged to participate  in individual, group and milieu therapy. Detox protocol was started.  The  patient was educated regarding risk of benzodiazepines, given Folic Acid,  considered for Campral and educated.  The patient participated in dual  diagnosis, worked on relapse prevention plan identifying what she can do  differently and how to work 100% of relapse prevention plan to increase her  likelihood of remaining abstinent.  The patient showed remarkable  improvement, participating fully in treatment and aftercare planning,  appeared highly motivated, having improved judgment and insight.  Mood was  more calm and looking forward to sobriety and working through marital issues  and formalizing divorce in a manner that would be more healthy and allow her  to have more control of the situation.  The patient was discharged after  medication education, advised not to take benzodiazepines, especially Xanax,  and to abstain from alcohol and discharged on:  1.  Benicar 20 mg q.a.m.  2.  Trazodone  100 mg q.9 p.m.  3.  Lexapro 5 mg q.a.m.  4.  Reglan 10 mg 1/2 to 1 every 8 hours p.r.n.  5.  Phenergan 25 mg 1 q.8 p.r.n. nausea.   DISPOSITION:  The patient was to follow up with [facility] through her job and  psychiatrist to be appointed to see within 7-10 days, and to follow up with  her relapse prevention plan.  The patient was discharged in improved  condition.   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  AXIS II:  None.  AXIS III:  Hypertension.  AXIS IV:  Severe, with marital conflict, problems with  primary support  group, life transition and financial stress.  AXIS V:  Global assessment of functioning on discharge was 55.      Jeanice Lim, M.D.  Electronically Signed     JEM/MEDQ  D:  12/30/2005  T:  12/31/2005  Job:  [id number]

## 2011-02-16 NOTE — H&P (Signed)
NAMESTAPHANY, DITTON               ACCOUNT NO.:  [id number]   MEDICAL RECORD NO.:  [id number]          PATIENT TYPE:  IPS   LOCATION:  0300                          FACILITY:  [facility]   PHYSICIAN:  Anselm Jungling, MD  DATE OF BIRTH:  July 05, 1966   DATE OF ADMISSION:  06/01/2006  DATE OF DISCHARGE:                         PSYCHIATRIC ADMISSION ASSESSMENT   IDENTIFYING INFORMATION:  This is a voluntary admission to the services of  Dr. Seabron Spates.  This is a 45 year old separated white female who is  admitted once again for detoxification.  Apparently her parents had gone  over  to her house to check on her.  They realized she had relapsed on  drinking.  The patient became very angry at her parents who were suggesting  that she try to get help again, and the patient actually called the police  who in fact brought her to [facility].  Apparently  she has been drinking 4 20-ounce glasses of wine every day for the past 3  weeks.  She reports having been very depressed and although denying suicidal  ideation she had made some statements to her mom a few weeks ago stating  that she wished she was dead.  She reports that she is going through a very  tough divorce, and her husband is requesting complete custody of their 28-  year-old daughter.  They have an upcoming court date in October regarding  custody.   PAST PSYCHIATRIC HISTORY:  The patient has been admitted in February, March,  April, and May, all for alcohol detoxes.  She has only been to the  [facility] unit here in February.  She was here February 20 to  February 22, and she reported having been detoxed 5 years prior to that at  [facility].   SOCIAL HISTORY:  She graduated high school in 1985.  She has been married 3  times, this time for 15 years.  She is legally separated.  They have a 56-  year-old daughter and she works for [employer].   FAMILY HISTORY:  She reports  that her father and siblings all have alcohol  issues.  She herself has been drinking 80 ounces of wine daily.   ALCOHOL AND DRUG ABUSE:  Primary care Aleigha Gilani is [facility].  She states that she is treated with Benicar for her hypertension.  She has  no record of this on a pharmacy, as she cannot afford to get her  medication.  She also has been successful with Lexapro 10 and again she is  noncompliant with that because of course she cannot afford her medications.   ALLERGIES:  No known drug allergies.   POSITIVE PHYSICAL FINDINGS:  PHYSICAL EXAMINATION:  Her vital signs show she  is 62 inches tall.  We do not have a weight.  Temperature is 98.1, blood  pressure is 146/96, 142/90, and pulse was 100-113, respirations are 20.  She  is status post a hysterectomy and the remainder of her physical examination  is unremarkable and was performed in the ED.  MENTAL STATUS EXAM:  She is alert and oriented x3.  She is casually groomed  and dressed.  She is adequately nourished.  Her speech is normal rate,  rhythm and tone.  Her mood is depressed and anxious.  Her affect is  congruent.  Thought processes are clear, rational and goal oriented.  Concentration and memory are intact.  She is not suicidal or homicidal.  She  does not have auditory or visual hallucinations.  Her intelligence is at  least average.   ADMISSION DIAGNOSES:  AXIS I:  Alcohol dependence, substance-induced  depressive disorder  AXIS II:  Deferred.  AXIS III:  Hypertension, not taking medication.  AXIS IV:  Problems with primary support group.  She has an upcoming court  date in October regarding custody issues.  AXIS V:  Global assessment of function is 45.   PLAN:  The plan is to support through detox.  The patient is hoping for  discharge late on Monday so she can return to her employment on Tuesday.  She is concerned that she may be fired if she does not return to work, and  she plans to have followup at  [facility]/      Vic Ripper, P.A.-C.      Anselm Jungling, MD  Electronically Signed    MD/MEDQ  D:  06/02/2006  T:  06/02/2006  Job:  [id number]

## 2011-02-16 NOTE — Discharge Summary (Signed)
NAMEKENNIYA, WESTRICH               ACCOUNT NO.:  [id number]   MEDICAL RECORD NO.:  [id number]          PATIENT TYPE:  INP   LOCATION:  4742                         FACILITY:  [facility]   PHYSICIAN:  Jackie Plum, M.D.DATE OF BIRTH:  08-13-1966   DATE OF ADMISSION:  02/24/2006  DATE OF DISCHARGE:  02/27/2006                                 DISCHARGE SUMMARY   DISCHARGE DIAGNOSES:  1. Alcohol withdrawal, resolved.  2. Hypertension.   DISCHARGE MEDICATIONS:  The patient was discharged to resume her  preadmission medications as previously.   She was asked to follow up with his PCP in 10 to 14 days and was asked to  stop drinking alcohol and smoking cigarettes.   REASON FOR ADMISSION:  Alcohol withdrawal.  Please see H&P dictated by Dr.  Ladona Ridgel on Feb 24, 2006.  Patient has some drinking alcohol and presented  with tremulousness.  She was admitted for further evaluation and management  of her alcohol withdrawal with symptomatic treatment of the nausea.  I  placed her on thiamine, folate and other measures instituted.  By day number  3, patient's sickness had resolved.  She was alert and oriented x3 and was  felt ready for discharge, in view of her resolution of her withdrawal  symptoms.  She was discharged in stable and satisfactory condition.  Received counseling regarding the need to stop drinking alcohol, the effects  of alcohol on overall well-being, the need to desist from smoking cigarettes  as well.      Jackie Plum, M.D.  Electronically Signed     GO/MEDQ  D:  05/02/2006  T:  05/02/2006  Job:  [id number]

## 2011-06-25 LAB — BASIC METABOLIC PANEL
BUN: 4 — ABNORMAL LOW
CO2: 25
Calcium: 9
Creatinine, Ser: 0.61
GFR calc non Af Amer: >60
Glucose, Bld: 163 — ABNORMAL HIGH

## 2011-06-25 LAB — DIFFERENTIAL
Basophils Absolute: 0.1
Eosinophils Absolute: 0
Eosinophils Relative: 0
Lymphocytes Relative: 52 — ABNORMAL HIGH
Lymphs Abs: 4.3 — ABNORMAL HIGH
Monocytes Absolute: 0.7

## 2011-06-25 LAB — CBC
HCT: 45.5
Hemoglobin: 15.2 — ABNORMAL HIGH
MCV: 95.5
Platelets: 335
RDW: 15

## 2011-06-25 LAB — RAPID URINE DRUG SCREEN, HOSP PERFORMED
Barbiturates: NOT DETECTED
Benzodiazepines: NOT DETECTED
Cocaine: NOT DETECTED

## 2011-06-25 LAB — URINE MICROSCOPIC-ADD ON

## 2011-06-25 LAB — URINALYSIS, ROUTINE W REFLEX MICROSCOPIC
Glucose, UA: NEGATIVE
Leukocytes, UA: NEGATIVE
pH: 6

## 2011-07-12 LAB — I-STAT 8, (EC8 V) (CONVERTED LAB)
BUN: 8
Glucose, Bld: 142 — ABNORMAL HIGH
Hemoglobin: 13.9
Potassium: 3.7
Sodium: 140
TCO2: 27
pH, Ven: 7.389 — ABNORMAL HIGH

## 2011-07-12 LAB — POCT I-STAT CREATININE: Operator id: 277751

## 2011-10-29 ENCOUNTER — Emergency Department (HOSPITAL_BASED_OUTPATIENT_CLINIC_OR_DEPARTMENT_OTHER)
Admission: EM | Admit: 2011-10-29 | Discharge: 2011-10-29 | Disposition: A | Discharge status: Home / Self Care | Payer: Self-pay | Attending: Emergency Medicine | Admitting: Emergency Medicine

## 2011-10-29 ENCOUNTER — Encounter (HOSPITAL_BASED_OUTPATIENT_CLINIC_OR_DEPARTMENT_OTHER): Payer: Self-pay | Admitting: *Deleted

## 2011-10-29 DIAGNOSIS — I10 Essential (primary) hypertension: Secondary | ICD-10-CM | POA: Insufficient documentation

## 2011-10-29 DIAGNOSIS — F17200 Nicotine dependence, unspecified, uncomplicated: Secondary | ICD-10-CM | POA: Insufficient documentation

## 2011-10-29 HISTORY — DX: Essential (primary) hypertension: I10

## 2011-10-29 MED ORDER — CYCLOBENZAPRINE HCL 10 MG PO TABS: 10.0000 mg | ORAL_TABLET | Freq: Two times a day (BID) | ORAL | Status: AC | PRN

## 2011-10-29 MED ORDER — BUSPIRONE HCL 10 MG PO TABS: 15.0000 mg | ORAL_TABLET | Freq: Two times a day (BID) | ORAL | Status: DC

## 2011-10-29 MED ORDER — CLONIDINE HCL 0.2 MG PO TABS: 0.2000 mg | ORAL_TABLET | Freq: Every day | ORAL | Status: DC

## 2011-10-29 MED ORDER — LISINOPRIL-HYDROCHLOROTHIAZIDE 20-12.5 MG PO TABS: 1.0000 | ORAL_TABLET | Freq: Every day | ORAL | Status: DC

## 2011-10-29 NOTE — ED Provider Notes (Signed)
This chart was scribed for Alexandra Bucco, MD by Williemae Natter. The patient was seen in room MH05/MH05 at 4:38 PM  CSN: [id number]  Arrival date & time 10/29/11  1559   None     Chief Complaint  Patient presents with  . Hypertension    (Consider location/radiation/quality/duration/timing/severity/associated sxs/prior treatment) Patient is a 46 y.o. female presenting with hypertension. The history is provided by the patient.  Hypertension This is a chronic problem. The current episode started more than 1 week ago. The problem occurs constantly. The problem has not changed since onset.Pertinent negatives include no chest pain, no abdominal pain, no headaches and no shortness of breath. Exacerbated by: anxiety. The symptoms are relieved by rest and sleep.   Pt has a hx of hypertension and ran out of prescription 8 months ago. Pt had an appointment with [facility] at [facility] with Dr. Sherron Flemings who directed her here to receive IV medicine to bring down blood pressure. Pt denies any back pain, fever, cough runny nose, dysuria. Pt is also taking anxiety pills and muscle relaxers which she is also out of. Past Medical History  Diagnosis Date  . Hypertension     Past Surgical History  Procedure Date  . Abdominal hysterectomy     History reviewed. No pertinent family history.  History  Substance Use Topics  . Smoking status: Current Everyday Smoker  . Smokeless tobacco: Not on file  . Alcohol Use: Yes    OB History    Grav Para Term Preterm Abortions TAB SAB Ect Mult Living                  Review of Systems  Respiratory: Negative for shortness of breath.   Cardiovascular: Negative for chest pain.  Gastrointestinal: Negative for abdominal pain.  Neurological: Negative for dizziness, speech difficulty, weakness and headaches.   10 Systems reviewed and are negative for acute change except as noted in the HPI.  Allergies  Review of patient's allergies indicates no  known allergies.  Home Medications   Current Outpatient Rx  Name Route Sig Dispense Refill  . ASPIRIN 325 MG PO TBEC Oral Take 325 mg by mouth every 6 (six) hours as needed. For pain    . PHENYLEPHRINE-ACETAMINOPHEN 5-325 MG PO TABS Oral Take 2 tablets by mouth daily as needed. For congestion    . BUSPIRONE HCL 10 MG PO TABS Oral Take 1.5 tablets (15 mg total) by mouth 2 (two) times daily. 45 tablet 1  . CLONIDINE HCL 0.2 MG PO TABS Oral Take 1 tablet (0.2 mg total) by mouth at bedtime. 30 tablet 1  . CYCLOBENZAPRINE HCL 10 MG PO TABS Oral Take 1 tablet (10 mg total) by mouth 2 (two) times daily as needed for muscle spasms. 20 tablet 0  . LISINOPRIL-HYDROCHLOROTHIAZIDE 20-12.5 MG PO TABS Oral Take 1 tablet by mouth daily. 30 tablet 1    BP 191/118  Pulse 96  Temp(Src) 98.4 F (36.9 C) (Oral)  Resp 20  SpO2 99%  Physical Exam  Nursing note and vitals reviewed. Constitutional: She is oriented to person, place, and time. She appears well-developed and well-nourished.  HENT:  Head: Normocephalic and atraumatic.  Eyes: Conjunctivae are normal. Pupils are equal, round, and reactive to light.  Neck: Normal range of motion. Neck supple.  Cardiovascular: Normal rate, regular rhythm and normal heart sounds.        hypertension  Pulmonary/Chest: Effort normal and breath sounds normal.  Abdominal: Soft. Bowel sounds are  normal. There is no tenderness.  Musculoskeletal: Normal range of motion. She exhibits no edema and no tenderness.  Neurological: She is alert and oriented to person, place, and time. She has normal strength. No cranial nerve deficit or sensory deficit. Coordination normal.  Skin: Skin is warm and dry.  Psychiatric: She has a normal mood and affect. Her behavior is normal.    ED Course  Procedures (including critical care time) DIAGNOSTIC STUDIES: Oxygen Saturation is 99% on room air, normal by my interpretation.    COORDINATION OF CARE:    Labs Reviewed - No data  to display No results found.   1. Hypertension       MDM  Pt with asymptomatic HTN, out of her meds for 8 months.  Needs refill on meds.  Will refill, advised to f/u with [facility]   I personally performed the services described in this documentation, which was scribed in my presence.  The recorded information has been reviewed and considered.        Alexandra Bucco, MD 10/29/11 [phone]

## 2011-10-29 NOTE — ED Notes (Signed)
High blood pressure saw Dr Sherron Flemings at [facility] on [address] in [place] she states the doctor told her that her blood pressure was too high to give her any medications told her to come to the emergency department and get iv medicine to bring it down then we would give her a prescription for meds ... Pt anxious and tearful in triage due to the above..states she has been on medications for years when dr Audria Nine treated her.

## 2012-06-13 ENCOUNTER — Emergency Department (HOSPITAL_BASED_OUTPATIENT_CLINIC_OR_DEPARTMENT_OTHER)
Admission: EM | Admit: 2012-06-13 | Discharge: 2012-06-13 | Disposition: A | Discharge status: Home / Self Care | Payer: Self-pay | Attending: Emergency Medicine | Admitting: Emergency Medicine

## 2012-06-13 ENCOUNTER — Encounter (HOSPITAL_BASED_OUTPATIENT_CLINIC_OR_DEPARTMENT_OTHER): Payer: Self-pay | Admitting: Emergency Medicine

## 2012-06-13 DIAGNOSIS — R21 Rash and other nonspecific skin eruption: Secondary | ICD-10-CM | POA: Insufficient documentation

## 2012-06-13 DIAGNOSIS — F17200 Nicotine dependence, unspecified, uncomplicated: Secondary | ICD-10-CM | POA: Insufficient documentation

## 2012-06-13 DIAGNOSIS — I10 Essential (primary) hypertension: Secondary | ICD-10-CM | POA: Insufficient documentation

## 2012-06-13 MED ORDER — PREDNISONE 10 MG PO TABS: ORAL_TABLET | ORAL | Status: DC

## 2012-06-13 MED ORDER — HYDROXYZINE HCL 25 MG PO TABS: 25.0000 mg | ORAL_TABLET | Freq: Four times a day (QID) | ORAL | Status: AC

## 2012-06-13 NOTE — ED Notes (Signed)
Pt c/o red itchy rash on arms bilaterally.

## 2012-06-13 NOTE — ED Provider Notes (Signed)
History     CSN: [id number]  Arrival date & time 06/13/12  1630   First MD Initiated Contact with Patient 06/13/12 1712      Chief Complaint  Patient presents with  . Rash    (Consider location/radiation/quality/duration/timing/severity/associated sxs/prior treatment) Patient is a 46 y.o. female presenting with rash. The history is provided by the patient. No language interpreter was used.  Rash  This is a new problem. Episode onset: 3 months. The problem has been gradually worsening. The problem is associated with nothing. There has been no fever. The rash is present on the left arm and right arm. The pain is at a severity of 5/10. The pain is moderate. The pain has been constant since onset. Associated symptoms include itching. She has tried nothing for the symptoms.  Pt complains of a rash on bilat arms.  Past Medical History  Diagnosis Date  . Hypertension     Past Surgical History  Procedure Date  . Abdominal hysterectomy     No family history on file.  History  Substance Use Topics  . Smoking status: Current Every Day Smoker  . Smokeless tobacco: Not on file  . Alcohol Use: Yes    OB History    Grav Para Term Preterm Abortions TAB SAB Ect Mult Living                  Review of Systems  Skin: Positive for itching and rash.  All other systems reviewed and are negative.    Allergies  Review of patient's allergies indicates no known allergies.  Home Medications   Current Outpatient Rx  Name Route Sig Dispense Refill  . ASPIRIN 325 MG PO TBEC Oral Take 325 mg by mouth every 6 (six) hours as needed. For pain    . LISINOPRIL-HYDROCHLOROTHIAZIDE 20-12.5 MG PO TABS Oral Take 1 tablet by mouth daily.    Marland Kitchen PHENYLEPHRINE-ACETAMINOPHEN 5-325 MG PO TABS Oral Take 2 tablets by mouth daily as needed. For congestion      BP 139/81  Temp 98.5 F (36.9 C) (Oral)  Resp 18  Ht 5\' 2"  (1.575 m)  Wt 130 lb (58.968 kg)  BMI 23.78 kg/m2  SpO2 100%  Physical Exam    Vitals reviewed. Constitutional: She is oriented to person, place, and time. She appears well-developed and well-nourished.  HENT:  Head: Normocephalic and atraumatic.  Eyes: Conjunctivae normal are normal. Pupils are equal, round, and reactive to light.  Cardiovascular: Normal rate.   Pulmonary/Chest: Effort normal.  Musculoskeletal: Normal range of motion.  Neurological: She is alert and oriented to person, place, and time. She has normal reflexes.  Skin: Rash noted.       Red areas bilat arms,  Scabbed scratch marks.   Psychiatric: She has a normal mood and affect.    ED Course  Procedures (including critical care time)  Labs Reviewed - No data to display No results found.   No diagnosis found.    MDM  Pt given rx for prednisone taper and atarax.   No primary MD.  Pt given referrals         Tarren Sabree Motley, [place] 06/13/12 1728

## 2012-06-14 NOTE — ED Provider Notes (Signed)
Medical screening examination/treatment/procedure(s) were performed by non-physician practitioner and as supervising physician I was immediately available for consultation/collaboration.  Doug Sou, MD 06/14/12 [phone]

## 2012-07-25 ENCOUNTER — Emergency Department (HOSPITAL_BASED_OUTPATIENT_CLINIC_OR_DEPARTMENT_OTHER)
Admission: EM | Admit: 2012-07-25 | Discharge: 2012-07-26 | Disposition: A | Discharge status: Home / Self Care | Payer: Self-pay | Attending: Emergency Medicine | Admitting: Emergency Medicine

## 2012-07-25 ENCOUNTER — Encounter (HOSPITAL_BASED_OUTPATIENT_CLINIC_OR_DEPARTMENT_OTHER): Payer: Self-pay | Admitting: *Deleted

## 2012-07-25 DIAGNOSIS — Z79899 Other long term (current) drug therapy: Secondary | ICD-10-CM | POA: Insufficient documentation

## 2012-07-25 DIAGNOSIS — R05 Cough: Secondary | ICD-10-CM

## 2012-07-25 DIAGNOSIS — IMO0002 Reserved for concepts with insufficient information to code with codable children: Secondary | ICD-10-CM | POA: Insufficient documentation

## 2012-07-25 DIAGNOSIS — R059 Cough, unspecified: Secondary | ICD-10-CM | POA: Insufficient documentation

## 2012-07-25 DIAGNOSIS — Z7982 Long term (current) use of aspirin: Secondary | ICD-10-CM | POA: Insufficient documentation

## 2012-07-25 DIAGNOSIS — J069 Acute upper respiratory infection, unspecified: Secondary | ICD-10-CM

## 2012-07-25 DIAGNOSIS — I10 Essential (primary) hypertension: Secondary | ICD-10-CM | POA: Insufficient documentation

## 2012-07-25 DIAGNOSIS — Z760 Encounter for issue of repeat prescription: Secondary | ICD-10-CM

## 2012-07-25 NOTE — ED Notes (Signed)
Pt reports cold symptoms x 3 wks. Pt smells of ETOH. Pt has a low grade fever of 99.8 orally. Pt reports congestion, productive cough, bilateral lungs CTA, Has taken OTC meds with no relief. Pt is an active smoker.

## 2012-07-25 NOTE — ED Notes (Addendum)
Pt reports not taking any of her prescribed meds since [facility] closed. Has a history of HTN. Pt reports drinking 6 beers prior to coming to th ER to dull the coughing pain.

## 2012-07-26 ENCOUNTER — Emergency Department (HOSPITAL_BASED_OUTPATIENT_CLINIC_OR_DEPARTMENT_OTHER): Payer: Self-pay

## 2012-07-26 MED ORDER — BENZONATATE 200 MG PO CAPS: 200.0000 mg | ORAL_CAPSULE | Freq: Three times a day (TID) | ORAL | Status: DC | PRN

## 2012-07-26 MED ORDER — LISINOPRIL-HYDROCHLOROTHIAZIDE 10-12.5 MG PO TABS: 1.0000 | ORAL_TABLET | Freq: Every day | ORAL | Status: DC

## 2012-07-26 MED ORDER — LORATADINE 10 MG PO TABS: 10.0000 mg | ORAL_TABLET | Freq: Every day | ORAL | Status: DC

## 2012-07-26 MED ORDER — AMLODIPINE BESYLATE 5 MG PO TABS
5.0000 mg | ORAL_TABLET | Freq: Once | ORAL | Status: AC
  Administered 2012-07-26: 5 mg via ORAL
  Filled 2012-07-26: qty 1

## 2012-07-26 NOTE — ED Provider Notes (Signed)
History     CSN: [id number]  Arrival date & time 07/25/12  2344   First MD Initiated Contact with Patient 07/26/12 0030      Chief Complaint  Patient presents with  . URI    (Consider location/radiation/quality/duration/timing/severity/associated sxs/prior treatment) Patient is a 46 y.o. female presenting with URI. The history is provided by the patient.  URI The primary symptoms include cough and wheezing. Primary symptoms do not include fever, fatigue, headaches, ear pain, sore throat, swollen glands, abdominal pain, nausea, vomiting, myalgias, arthralgias or rash. Primary symptoms comment: nasal congestion and rhinorrhea and also needs medication refill The current episode started 6 to 7 days ago. This is a new problem. The problem has not changed since onset. The cough began 6 to 7 days ago. The cough is new. The cough is productive.  Wheezing began more than 2 days ago. Wheezing occurs rarely. The wheezing has been unchanged since its onset. The wheezing had no precipitant. The patient's medical history is significant for COPD.  Symptoms associated with the illness include congestion and rhinorrhea. The illness is not associated with chills, plugged ear sensation or sinus pressure. The following treatments were addressed: Acetaminophen was not tried. A decongestant was not tried. NSAIDs were not tried.  Drank a six pack for her cough  Past Medical History  Diagnosis Date  . Hypertension     Past Surgical History  Procedure Date  . Abdominal hysterectomy     No family history on file.  History  Substance Use Topics  . Smoking status: Current Every Day Smoker  . Smokeless tobacco: Not on file  . Alcohol Use: Yes    OB History    Grav Para Term Preterm Abortions TAB SAB Ect Mult Living                  Review of Systems  Constitutional: Negative for fever, chills and fatigue.  HENT: Positive for congestion and rhinorrhea. Negative for ear pain, sore throat and  sinus pressure.   Respiratory: Positive for cough and wheezing.   Gastrointestinal: Negative for nausea, vomiting and abdominal pain.  Musculoskeletal: Negative for myalgias and arthralgias.  Skin: Negative for rash.  Neurological: Negative for headaches.  All other systems reviewed and are negative.    Allergies  Review of patient's allergies indicates no known allergies.  Home Medications   Current Outpatient Rx  Name Route Sig Dispense Refill  . ASPIRIN 325 MG PO TBEC Oral Take 325 mg by mouth every 6 (six) hours as needed. For pain    . LISINOPRIL-HYDROCHLOROTHIAZIDE 20-12.5 MG PO TABS Oral Take 1 tablet by mouth daily.    Marland Kitchen PHENYLEPHRINE-ACETAMINOPHEN 5-325 MG PO TABS Oral Take 2 tablets by mouth daily as needed. For congestion    . PREDNISONE 10 MG PO TABS  6,5,4,3,2,1 taper 21 tablet 0    BP 179/99  Pulse 83  Temp 98.4 F (36.9 C) (Oral)  Ht 5\' 2"  (1.575 m)  Wt 140 lb (63.504 kg)  BMI 25.61 kg/m2  SpO2 99%  Physical Exam  Constitutional: She is oriented to person, place, and time. She appears well-developed and well-nourished.  HENT:  Head: Normocephalic and atraumatic.  Mouth/Throat: Oropharynx is clear and moist.  Eyes: Conjunctivae normal and EOM are normal. Pupils are equal, round, and reactive to light.  Neck: Normal range of motion. Neck supple.  Cardiovascular: Normal rate and regular rhythm.   Pulmonary/Chest: Effort normal and breath sounds normal. No respiratory distress. She has no wheezes.  She has no rales.  Abdominal: Soft. Bowel sounds are normal. There is no tenderness. There is no rebound and no guarding.  Musculoskeletal: Normal range of motion.  Lymphadenopathy:    She has no cervical adenopathy.  Neurological: She is alert and oriented to person, place, and time.  Skin: Skin is warm and dry.  Psychiatric: She has a normal mood and affect.    ED Course  Procedures (including critical care time)   Labs Reviewed  PREGNANCY, URINE   Dg  Chest 2 View  07/26/2012  *RADIOLOGY REPORT*  Clinical Data: Cough, congestion  CHEST - 2 VIEW  Comparison: 10/22/2008  Findings: Lungs are essentially clear.  No focal consolidation.  No pleural effusion or pneumothorax.  Cardiomediastinal silhouette is within normal limits.  Very mild degenerative changes of the visualized thoracic spine.  IMPRESSION: No evidence of acute cardiopulmonary disease.   Original Report Authenticated By: Charline Bills, M.D.      No diagnosis found.    MDM  Follow up with your family doctor for ongoing care.         Jasmine Awe, MD 07/26/12 [phone]

## 2014-10-25 ENCOUNTER — Ambulatory Visit: Payer: Self-pay

## 2014-11-19 ENCOUNTER — Ambulatory Visit: Payer: Self-pay | Attending: Internal Medicine

## 2015-02-11 ENCOUNTER — Ambulatory Visit: Payer: Self-pay | Admitting: Family Medicine

## 2015-02-21 ENCOUNTER — Ambulatory Visit: Payer: Self-pay | Attending: Family Medicine | Admitting: Family Medicine

## 2015-02-21 ENCOUNTER — Encounter: Payer: Self-pay | Admitting: Family Medicine

## 2015-02-21 VITALS — BP 150/90 | HR 101 | Temp 98.6°F | Resp 16 | Ht 62.0 in | Wt 122.0 lb

## 2015-02-21 DIAGNOSIS — K089 Disorder of teeth and supporting structures, unspecified: Secondary | ICD-10-CM | POA: Insufficient documentation

## 2015-02-21 DIAGNOSIS — R030 Elevated blood-pressure reading, without diagnosis of hypertension: Secondary | ICD-10-CM

## 2015-02-21 DIAGNOSIS — I10 Essential (primary) hypertension: Secondary | ICD-10-CM | POA: Insufficient documentation

## 2015-02-21 DIAGNOSIS — IMO0001 Reserved for inherently not codable concepts without codable children: Secondary | ICD-10-CM

## 2015-02-21 DIAGNOSIS — K088 Other specified disorders of teeth and supporting structures: Secondary | ICD-10-CM

## 2015-02-21 DIAGNOSIS — E1165 Type 2 diabetes mellitus with hyperglycemia: Secondary | ICD-10-CM | POA: Insufficient documentation

## 2015-02-21 DIAGNOSIS — Z720 Tobacco use: Secondary | ICD-10-CM

## 2015-02-21 DIAGNOSIS — Z114 Encounter for screening for human immunodeficiency virus [HIV]: Secondary | ICD-10-CM | POA: Insufficient documentation

## 2015-02-21 DIAGNOSIS — F17200 Nicotine dependence, unspecified, uncomplicated: Secondary | ICD-10-CM | POA: Insufficient documentation

## 2015-02-21 DIAGNOSIS — IMO0002 Reserved for concepts with insufficient information to code with codable children: Secondary | ICD-10-CM | POA: Insufficient documentation

## 2015-02-21 DIAGNOSIS — Z90710 Acquired absence of both cervix and uterus: Secondary | ICD-10-CM | POA: Insufficient documentation

## 2015-02-21 DIAGNOSIS — R739 Hyperglycemia, unspecified: Secondary | ICD-10-CM

## 2015-02-21 DIAGNOSIS — K7010 Alcoholic hepatitis without ascites: Secondary | ICD-10-CM

## 2015-02-21 DIAGNOSIS — F1010 Alcohol abuse, uncomplicated: Secondary | ICD-10-CM | POA: Insufficient documentation

## 2015-02-21 DIAGNOSIS — Z0000 Encounter for general adult medical examination without abnormal findings: Secondary | ICD-10-CM

## 2015-02-21 LAB — LIPID PANEL
Cholesterol: 407 mg/dL — ABNORMAL HIGH (ref 0–200)
HDL: 35 mg/dL — ABNORMAL LOW (ref >=46)
TRIGLYCERIDES: 408 mg/dL — AB (ref <150)
Total CHOL/HDL Ratio: 11.6 Ratio

## 2015-02-21 LAB — COMPLETE METABOLIC PANEL WITH GFR
ALBUMIN: 4.7 g/dL (ref 3.5–5.2)
ALK PHOS: 231 U/L — AB (ref 39–117)
ALT: 125 U/L — AB (ref 0–35)
AST: 178 U/L — ABNORMAL HIGH (ref 0–37)
BUN: 10 mg/dL (ref 6–23)
CO2: 25 meq/L (ref 19–32)
CREATININE: 0.61 mg/dL (ref 0.50–1.10)
Calcium: 9.6 mg/dL (ref 8.4–10.5)
Chloride: 96 mEq/L (ref 96–112)
GLUCOSE: 335 mg/dL — AB (ref 70–99)
POTASSIUM: 4.8 meq/L (ref 3.5–5.3)
Sodium: 138 mEq/L (ref 135–145)
TOTAL PROTEIN: 7.7 g/dL (ref 6.0–8.3)
Total Bilirubin: 1 mg/dL (ref 0.2–1.2)

## 2015-02-21 LAB — CBC
HCT: 46.1 % — ABNORMAL HIGH (ref 36.0–46.0)
Hemoglobin: 15.7 g/dL — ABNORMAL HIGH (ref 12.0–15.0)
MCH: 34.1 pg — ABNORMAL HIGH (ref 26.0–34.0)
MCHC: 34.1 g/dL (ref 30.0–36.0)
MCV: 100.2 fL — ABNORMAL HIGH (ref 78.0–100.0)
MPV: 11.1 fL (ref 8.6–12.4)
PLATELETS: 206 10*3/uL (ref 150–400)
RBC: 4.6 MIL/uL (ref 3.87–5.11)
RDW: 13.2 % (ref 11.5–15.5)
WBC: 9 10*3/uL (ref 4.0–10.5)

## 2015-02-21 LAB — POCT URINALYSIS DIPSTICK
GLUCOSE UA: 500
Glucose, UA: 500
Ketones, UA: 15
Leukocytes, UA: NEGATIVE
Leukocytes, UA: NEGATIVE
NITRITE UA: NEGATIVE
NITRITE UA: NEGATIVE
RBC UA: NEGATIVE
Spec Grav, UA: >=1.03
Spec Grav, UA: >=1.03
Urobilinogen, UA: 2
pH, UA: 7
pH, UA: 7

## 2015-02-21 LAB — GLUCOSE, POCT (MANUAL RESULT ENTRY)
POC GLUCOSE: 194 mg/dL — AB (ref 70–99)
POC Glucose: 352 mg/dl — AB (ref 70–99)
POC Glucose: 358 mg/dl — AB (ref 70–99)

## 2015-02-21 LAB — POCT GLYCOSYLATED HEMOGLOBIN (HGB A1C): HEMOGLOBIN A1C: 13.1

## 2015-02-21 MED ORDER — CLONIDINE HCL 0.1 MG PO TABS
0.2000 mg | ORAL_TABLET | Freq: Once | ORAL | Status: AC
  Administered 2015-02-21: 0.2 mg via ORAL

## 2015-02-21 MED ORDER — GLUCOSE BLOOD VI STRP: 1.0000 | ORAL_STRIP | Freq: Three times a day (TID) | Status: DC

## 2015-02-21 MED ORDER — AMLODIPINE BESYLATE 10 MG PO TABS: 10.0000 mg | ORAL_TABLET | Freq: Every day | ORAL | Status: DC

## 2015-02-21 MED ORDER — TRUE METRIX METER W/DEVICE KIT: 1.0000 | PACK | Status: DC | PRN

## 2015-02-21 MED ORDER — SODIUM CHLORIDE 0.9 % IV BOLUS (SEPSIS)
1000.0000 mL | Freq: Once | INTRAVENOUS | Status: AC
  Administered 2015-02-21: 1000 mL via INTRAVENOUS

## 2015-02-21 MED ORDER — INSULIN ASPART 100 UNIT/ML ~~LOC~~ SOLN
20.0000 [IU] | Freq: Once | SUBCUTANEOUS | Status: AC
  Administered 2015-02-21: 20 [IU] via SUBCUTANEOUS

## 2015-02-21 MED ORDER — LISINOPRIL-HYDROCHLOROTHIAZIDE 20-12.5 MG PO TABS: 1.0000 | ORAL_TABLET | Freq: Every day | ORAL | Status: DC

## 2015-02-21 MED ORDER — INSULIN GLARGINE 300 UNIT/ML ~~LOC~~ SOPN: 10.0000 [IU] | PEN_INJECTOR | SUBCUTANEOUS | Status: DC

## 2015-02-21 MED ORDER — ACCU-CHEK SOFTCLIX LANCETS MISC: 1.0000 | Freq: Three times a day (TID) | Status: DC

## 2015-02-21 MED ORDER — INSULIN PEN NEEDLE 31G X 8 MM MISC: 1.0000 "application " | Freq: Every day | Status: DC

## 2015-02-21 MED ORDER — TRUEPLUS LANCETS 28G MISC: 1.0000 | Freq: Three times a day (TID) | Status: DC

## 2015-02-21 MED ORDER — ACCU-CHEK SOFTCLIX LANCET DEV MISC: 1.0000 | Freq: Three times a day (TID) | Status: DC

## 2015-02-21 NOTE — Progress Notes (Signed)
   Subjective:    Patient ID: [name], female    DOB: 12-04-1965, 49 y.o.   MRN: [id number] CC; establish care, HTN, ? DM  HPI  1. HTN: dx in 1999. No medications. No HA, CP, SOB, edema.   2. ? Diabetes: had CBG checked at the home of a client and it was above 200. Has hx of gestational diabetes. Has been feeling a bit run down. No significant weight loss.   Soc Hx: current smoker Med Hx: gestational diabetes in 1993  Surg Hx: s/p hysterectomy   Review of Systems  Constitutional: Negative.  Negative for fever and chills.  Eyes: Positive for visual disturbance.       Needs new contacts   Respiratory: Negative.   Cardiovascular: Negative.   Endocrine: Negative.   Musculoskeletal: Negative.   Skin: Negative.   Allergic/Immunologic: Negative.   Neurological: Positive for headaches.  Psychiatric/Behavioral: Negative.        Objective:   Physical Exam BP 150/90 mmHg  Pulse 101  Temp(Src) 98.6 F (37 C) (Oral)  Resp 16  Ht 5\' 2"  (1.575 m)  Wt 122 lb (55.339 kg)  BMI 22.31 kg/m2  SpO2 97% General appearance: alert, cooperative and no distress Eyes: conjunctivae/corneas clear. PERRL, EOM's intact.  Throat: lips, mucosa, and tongue normal; teeth and gums normal Neck: no adenopathy, supple, symmetrical, trachea midline and thyroid not enlarged, symmetric, no tenderness/mass/nodules Lungs: clear to auscultation bilaterally Heart: regular rate and rhythm, S1, S2 normal, no murmur, click, rub or gallop Extremities: extremities normal, atraumatic, no cyanosis or edema Pulses: 2+ and symmetric Skin: Skin color, texture, turgor normal. No rashes or lesions Neurologic: Grossly normal  Lab Results  Component Value Date   HGBA1C 13.10 02/21/2015   CBG 352  UA:  500 glucose, 15 ketones  Treated with clonidine 0.2 mg PO x one, novolog 20 U  PO x one, I L NS  Repeat CBG 194  UA: trace ketones      Assessment & Plan:

## 2015-02-21 NOTE — Progress Notes (Signed)
Establish Care Hx HTN, no medication x 4 years ago  Hx Tobacco-1ppday- 1 1/2 ppday Requesting DM check due to family Hx

## 2015-02-21 NOTE — Assessment & Plan Note (Signed)
HTN: Goal is BP < 140/90 Start norvasc 10 mg daily in evening  Start prinzide 20-12.5 mg once daily in morning   DASH diet  Smoking raises BP, you must quit for your health

## 2015-02-21 NOTE — Assessment & Plan Note (Signed)
Diabetes: new diagnosis, untreated   Start toujeo 10 U in the morning Drink plenty of water  Low carb diet, cut down alcohol to 24 oz or less in a day. Cut down to light beer.  Check and write down sugars Ophthalmology referral

## 2015-02-21 NOTE — Assessment & Plan Note (Signed)
A; smoker with HTN and DM2 P: Counseling given

## 2015-02-21 NOTE — Assessment & Plan Note (Signed)
A: excessive beer intake in newly diagnosed diabetic P: Recommend cutting down to light beer and cutting down to in amount from 40 to 24 oz or less a day

## 2015-02-21 NOTE — Patient Instructions (Addendum)
[name],  Thank you for coming in today. It was a pleasure meeting you. I look forward to being your primary doctor.   1. HTN: Goal is BP < 140/90 Start norvasc 10 mg daily in evening  Start prinzide 20-12.5 mg once daily in morning   DASH diet  Smoking raises BP, you must quit for your health   2. Diabetes:  Start toujeo 10 U in the morning Drink plenty of water  Low carb diet, cut down alcohol to 24 oz or less in a day. Cut down to light beer.  Check and write down sugars Ophthalmology referral   Diabetes blood sugar goals  Fasting (in AM before breakfast, 8 hrs of no eating or drinking (except water or unsweetened coffee or tea): 90-110 2 hrs after meals: < 160,   No low sugars: nothing < 70    3. Smoking: Smoking cessation support: smoking cessation hotline: 1-800-QUIT-NOW.  Smoking cessation classes are available through [facility]. Call [phone] or visit our website at [email or web address]  4. Molar loose and painful: dental referral  Please apply for the orange card  F/u in 2 weeks with RN for BP and CBG check F/u with me in 6 weeks for HTN f/u and pap smear  Dr. Adrian Blackwater   Diet Recommendations for Diabetes   Starchy (carb) foods include: Bread, rice, pasta, potatoes, corn, crackers, bagels, muffins, all baked goods.   Protein foods include: Meat, fish, poultry, eggs, dairy foods, and beans such as pinto and kidney beans (beans also provide carbohydrate).   1. Eat at least 3 meals and 1-2 snacks per day. Never go more than 4-5 hours while awake without eating.  2. Limit starchy foods to TWO per meal and ONE per snack. ONE portion of a starchy  food is equal to the following:   - ONE slice of bread (or its equivalent, such as half of a hamburger bun).   - 1/2 cup of a "scoopable" starchy food such as potatoes or rice.   - 1 OUNCE (28 grams) of starchy snack foods such as crackers or pretzels (look on label).   - 15 grams of  carbohydrate as shown on food label.  3. Both lunch and dinner should include a protein food, a carb food, and vegetables.   - Obtain twice as many veg's as protein or carbohydrate foods for both lunch and dinner.   - Try to keep frozen veg's on hand for a quick vegetable serving.     - Fresh or frozen veg's are best.  4. Breakfast should always include protein.    DASH Eating Plan DASH stands for "Dietary Approaches to Stop Hypertension." The DASH eating plan is a healthy eating plan that has been shown to reduce high blood pressure (hypertension). Additional health benefits may include reducing the risk of type 2 diabetes mellitus, heart disease, and stroke. The DASH eating plan may also help with weight loss. WHAT DO I NEED TO KNOW ABOUT THE DASH EATING PLAN? For the DASH eating plan, you will follow these general guidelines:  Choose foods with a percent daily value for sodium of less than 5% (as listed on the food label).  Use salt-free seasonings or herbs instead of table salt or sea salt.  Check with your health care provider or pharmacist before using salt substitutes.  Eat lower-sodium products, often labeled as "lower sodium" or "no salt added."  Eat fresh foods.  Eat more vegetables, fruits, and low-fat dairy  products.  Choose whole grains. Look for the word "whole" as the first word in the ingredient list.  Choose fish and skinless chicken or [place] more often than red meat. Limit fish, poultry, and meat to 6 oz (170 g) each day.  Limit sweets, desserts, sugars, and sugary drinks.  Choose heart-healthy fats.  Limit cheese to 1 oz (28 g) per day.  Eat more home-cooked food and less restaurant, buffet, and fast food.  Limit fried foods.  Cook foods using methods other than frying.  Limit canned vegetables. If you do use them, rinse them well to decrease the sodium.  When eating at a restaurant, ask that your food be prepared with less salt, or no salt if  possible. WHAT FOODS CAN I EAT? Seek help from a dietitian for individual calorie needs. Grains Whole grain or whole wheat bread. Brown rice. Whole grain or whole wheat pasta. Quinoa, bulgur, and whole grain cereals. Low-sodium cereals. Corn or whole wheat flour tortillas. Whole grain cornbread. Whole grain crackers. Low-sodium crackers. Vegetables Fresh or frozen vegetables (raw, steamed, roasted, or grilled). Low-sodium or reduced-sodium tomato and vegetable juices. Low-sodium or reduced-sodium tomato sauce and paste. Low-sodium or reduced-sodium canned vegetables.  Fruits All fresh, canned (in natural juice), or frozen fruits. Meat and Other Protein Products Ground beef (85% or leaner), grass-fed beef, or beef trimmed of fat. Skinless chicken or [place]. Ground chicken or [place]. Pork trimmed of fat. All fish and seafood. Eggs. Dried beans, peas, or lentils. Unsalted nuts and seeds. Unsalted canned beans. Dairy Low-fat dairy products, such as skim or 1% milk, 2% or reduced-fat cheeses, low-fat ricotta or cottage cheese, or plain low-fat yogurt. Low-sodium or reduced-sodium cheeses. Fats and Oils Tub margarines without trans fats. Light or reduced-fat mayonnaise and salad dressings (reduced sodium). Avocado. Safflower, olive, or canola oils. Natural peanut or almond butter. Other Unsalted popcorn and pretzels. The items listed above may not be a complete list of recommended foods or beverages. Contact your dietitian for more options. WHAT FOODS ARE NOT RECOMMENDED? Grains White bread. White pasta. White rice. Refined cornbread. Bagels and croissants. Crackers that contain trans fat. Vegetables Creamed or fried vegetables. Vegetables in a cheese sauce. Regular canned vegetables. Regular canned tomato sauce and paste. Regular tomato and vegetable juices. Fruits Dried fruits. Canned fruit in light or heavy syrup. Fruit juice. Meat and Other Protein Products Fatty cuts of meat. Ribs, chicken  wings, bacon, sausage, bologna, salami, chitterlings, fatback, hot dogs, bratwurst, and packaged luncheon meats. Salted nuts and seeds. Canned beans with salt. Dairy Whole or 2% milk, cream, half-and-half, and cream cheese. Whole-fat or sweetened yogurt. Full-fat cheeses or blue cheese. Nondairy creamers and whipped toppings. Processed cheese, cheese spreads, or cheese curds. Condiments Onion and garlic salt, seasoned salt, table salt, and sea salt. Canned and packaged gravies. Worcestershire sauce. Tartar sauce. Barbecue sauce. Teriyaki sauce. Soy sauce, including reduced sodium. Steak sauce. Fish sauce. Oyster sauce. Cocktail sauce. Horseradish. Ketchup and mustard. Meat flavorings and tenderizers. Bouillon cubes. Hot sauce. Tabasco sauce. Marinades. Taco seasonings. Relishes. Fats and Oils Butter, stick margarine, lard, shortening, ghee, and bacon fat. Coconut, palm kernel, or palm oils. Regular salad dressings. Other Pickles and olives. Salted popcorn and pretzels. The items listed above may not be a complete list of foods and beverages to avoid. Contact your dietitian for more information. WHERE CAN I FIND MORE INFORMATION? National Heart, Lung, and Blood Institute: travelstabloid.com Document Released: 09/06/2011 Document Revised: 02/01/2014 Document Reviewed: 07/22/2013 ExitCare Patient Information 2015 [employer]. This information is not intended to replace advice given to you by your health care provider. Make sure you discuss any questions you have with your health care provider.  

## 2015-02-21 NOTE — Assessment & Plan Note (Signed)
Screening HIV ordered  

## 2015-02-22 LAB — MICROALBUMIN / CREATININE URINE RATIO
CREATININE, URINE: 89.9 mg/dL
MICROALB/CREAT RATIO: 1253.6 mg/g — AB (ref 0.0–30.0)
Microalb, Ur: 112.7 mg/dL — ABNORMAL HIGH (ref <2.0)

## 2015-02-22 LAB — HIV ANTIBODY (ROUTINE TESTING W REFLEX): HIV: NONREACTIVE

## 2015-02-23 DIAGNOSIS — K7010 Alcoholic hepatitis without ascites: Secondary | ICD-10-CM | POA: Insufficient documentation

## 2015-02-23 NOTE — Addendum Note (Signed)
Addended by: Boykin Nearing on: 02/23/2015 03:05 PM   Modules accepted: Orders

## 2015-03-02 ENCOUNTER — Telehealth: Payer: Self-pay | Admitting: *Deleted

## 2015-03-02 NOTE — Telephone Encounter (Signed)
Unable to contact pt Not available at this time

## 2015-03-02 NOTE — Telephone Encounter (Signed)
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 Cut down alcohol with goal of slowly quitting. Do not stop cold [place].  F/u if you need help quitting.  F/u labs-acute hepatitis panel to rule out viral hepatitis.   CMP normal except for high blood sugar and elevated liver enzymes CBC normal except for high MCV Elevated cholesterols

## 2015-03-28 ENCOUNTER — Ambulatory Visit: Payer: Self-pay | Attending: Family Medicine | Admitting: Family Medicine

## 2015-03-28 ENCOUNTER — Encounter: Payer: Self-pay | Admitting: Family Medicine

## 2015-03-28 VITALS — BP 136/73 | HR 95 | Temp 98.6°F | Resp 16 | Ht 62.0 in | Wt 121.0 lb

## 2015-03-28 DIAGNOSIS — I10 Essential (primary) hypertension: Secondary | ICD-10-CM

## 2015-03-28 DIAGNOSIS — IMO0002 Reserved for concepts with insufficient information to code with codable children: Secondary | ICD-10-CM

## 2015-03-28 DIAGNOSIS — E1165 Type 2 diabetes mellitus with hyperglycemia: Secondary | ICD-10-CM

## 2015-03-28 LAB — GLUCOSE, POCT (MANUAL RESULT ENTRY)
POC GLUCOSE: 205 mg/dL — AB (ref 70–99)
POC Glucose: 303 mg/dl — AB (ref 70–99)

## 2015-03-28 MED ORDER — INSULIN ASPART 100 UNIT/ML ~~LOC~~ SOLN
5.0000 [IU] | Freq: Once | SUBCUTANEOUS | Status: AC
  Administered 2015-03-28: 5 [IU] via SUBCUTANEOUS

## 2015-03-28 NOTE — Progress Notes (Signed)
   Subjective:    Patient ID: [name], female    DOB: 1966-06-10, 49 y.o.   MRN: [id number] CC: f/u HTN and DM HPI  1. CHRONIC DIABETES  Disease Monitoring  Blood Sugar Ranges: 89-203  Polyuria: yes   Visual problems: yes   Medication Compliance: yes  Medication Side Effects  Hypoglycemia: no   Drank muscle milk on the way to clinic this AM. Trying to get extra protein in her diet.   2. CHRONIC HYPERTENSION  Disease Monitoring  Blood pressure range: not checking   Chest pain: no   Dyspnea: no   Claudication: no   Medication compliance: yes  Medication Side Effects  Lightheadedness: no   Urinary frequency: yes   Edema: no     3. Abdominal pain: had lower abdominal pain this AM. No resolved. No fever, chills, nausea or emesis.  Soc Hx: currently smoker  Surg Hx: s/p hysterectomy for heavy menstrual bleeding  Review of Systems  Constitutional: Negative for fever and chills.  Respiratory: Negative for shortness of breath.   Cardiovascular: Negative for chest pain and leg swelling.  Gastrointestinal: Positive for abdominal pain. Negative for nausea, vomiting and abdominal distention.       Objective:   Physical Exam BP 136/73 mmHg  Pulse 95  Temp(Src) 98.6 F (37 C) (Oral)  Resp 16  Ht 5\' 2"  (1.575 m)  Wt 121 lb (54.885 kg)  BMI 22.13 kg/m2  SpO2 97%  Wt Readings from Last 3 Encounters:  03/28/15 121 lb (54.885 kg)  02/21/15 122 lb (55.339 kg)  07/25/12 140 lb (63.504 kg)   Head: Normocephalic, without obvious abnormality, atraumatic Lungs: clear to auscultation bilaterally Heart: regular rate and rhythm, S1, S2 normal, no murmur, click, rub or gallop Abdomen: soft, non-tender; bowel sounds normal; no masses,  no organomegaly Extremities: extremities normal, atraumatic, no cyanosis or edema  Lab Results  Component Value Date   HGBA1C 13.10 02/21/2015   CBHG 303  5 U novolog  Repeat CBG     Assessment & Plan:

## 2015-03-28 NOTE — Progress Notes (Signed)
F/U HTN  Annual pap smear Hyst due to heavy bleeding at age 49  Complaining stomach pain

## 2015-03-28 NOTE — Patient Instructions (Addendum)
Ms. Ragen,  Thank you for coming in today   1. Diabetes: Higher sugar today Increase toujeo to 15 U daily glucerna plus is recommended for added protein   2. HTN: BP is great Continue prinzide  3. Healthcare maintenance: Pneumovax today  F/u in 4 weeks with RN for CBG check F/u in 2 months with me for diabetes and HTN  Dr. Adrian Blackwater

## 2015-03-31 NOTE — Assessment & Plan Note (Signed)
A: Blood pressure at goal. Meds: compliant  P: I will continue the patient's current medication regimen since her blood pressure is at goal.   

## 2015-03-31 NOTE — Assessment & Plan Note (Signed)
  1. Diabetes: Higher sugar today Increase toujeo to 15 U daily glucerna plus is recommended for added protein

## 2015-05-18 ENCOUNTER — Other Ambulatory Visit: Payer: Self-pay | Admitting: Family Medicine

## 2015-05-24 NOTE — Telephone Encounter (Signed)
Pt. Needs blood pressure and insulin refilled, please send refills to [facility]...Marland KitchenMarland Kitchenplease call patient

## 2015-05-27 ENCOUNTER — Other Ambulatory Visit: Payer: Self-pay | Admitting: Family Medicine

## 2015-05-27 DIAGNOSIS — IMO0002 Reserved for concepts with insufficient information to code with codable children: Secondary | ICD-10-CM

## 2015-05-27 DIAGNOSIS — I10 Essential (primary) hypertension: Secondary | ICD-10-CM

## 2015-05-27 DIAGNOSIS — E1165 Type 2 diabetes mellitus with hyperglycemia: Secondary | ICD-10-CM

## 2015-05-27 MED ORDER — INSULIN GLARGINE 300 UNIT/ML ~~LOC~~ SOPN: 10.0000 [IU] | PEN_INJECTOR | SUBCUTANEOUS | Status: DC

## 2015-05-27 MED ORDER — AMLODIPINE BESYLATE 10 MG PO TABS: 10.0000 mg | ORAL_TABLET | Freq: Every day | ORAL | Status: DC

## 2015-05-27 MED ORDER — LISINOPRIL-HYDROCHLOROTHIAZIDE 20-12.5 MG PO TABS: 1.0000 | ORAL_TABLET | Freq: Every day | ORAL | Status: DC

## 2015-05-27 NOTE — Telephone Encounter (Signed)
Patient is requesting a refill for the following:  lisinopril-hydrochlorothiazide (PRINZIDE,ZESTORETIC) 20-12.5 MG per tablet amLODipine (NORVASC) 10 MG tablet Insulin Glargine (TOUJEO SOLOSTAR) 300 UNIT/ML SOPN  Please follow up with pt.

## 2015-05-27 NOTE — Telephone Encounter (Signed)
Patient called in. Requesting BP and diabetes meds meds refilled x 90 day supply

## 2015-08-12 ENCOUNTER — Other Ambulatory Visit: Payer: Self-pay | Admitting: *Deleted

## 2015-08-12 DIAGNOSIS — E1165 Type 2 diabetes mellitus with hyperglycemia: Secondary | ICD-10-CM

## 2015-08-12 DIAGNOSIS — I10 Essential (primary) hypertension: Secondary | ICD-10-CM

## 2015-08-12 DIAGNOSIS — IMO0001 Reserved for inherently not codable concepts without codable children: Secondary | ICD-10-CM

## 2015-08-12 MED ORDER — INSULIN GLARGINE 300 UNIT/ML ~~LOC~~ SOPN: 10.0000 [IU] | PEN_INJECTOR | SUBCUTANEOUS | Status: DC

## 2015-08-12 MED ORDER — AMLODIPINE BESYLATE 10 MG PO TABS: 10.0000 mg | ORAL_TABLET | Freq: Every day | ORAL | Status: DC

## 2015-08-12 MED ORDER — LISINOPRIL-HYDROCHLOROTHIAZIDE 20-12.5 MG PO TABS: 1.0000 | ORAL_TABLET | Freq: Every day | ORAL | Status: DC

## 2015-08-12 NOTE — Telephone Encounter (Signed)
Patients Lisinopril, Amlodipine and Insulin has been filled with 3 additional refills.

## 2015-09-20 ENCOUNTER — Other Ambulatory Visit: Payer: Self-pay | Admitting: Family Medicine

## 2015-10-04 MED FILL — ?AMLODIPINE BESYLATE 10 MG: 10 | 30 days supply | Qty: 30 | Fill #4

## 2015-10-04 MED FILL — TOUJEO SOLOSTAR 300 UNITS/M: 300 | 90 days supply | Qty: 5 | Fill #0

## 2015-10-04 MED FILL — LISINOPRIL-HCTZ 20-12.5 MG: 20-12.5 | 30 days supply | Qty: 30 | Fill #4

## 2015-11-17 MED FILL — LISINOPRIL-HCTZ 20-12.5 MG: 20-12.5 | 30 days supply | Qty: 30 | Fill #5

## 2015-11-17 MED FILL — AMLODIPINE BESYLATE 10 MG T: 10 | 30 days supply | Qty: 30 | Fill #5

## 2015-12-15 MED FILL — LISINOPRIL-HCTZ 20-12.5 MG: 20-12.5 | 30 days supply | Qty: 30 | Fill #6

## 2015-12-15 MED FILL — AMLODIPINE BESYLATE 10 MG T: 10 | 30 days supply | Qty: 30 | Fill #6

## 2015-12-15 MED FILL — TRUE METRIX GLUCOSE TEST ST: 25 days supply | Qty: 100 | Fill #5

## 2015-12-15 MED FILL — TOUJEO SOLOSTAR 300 UNITS/M: 300 | 90 days supply | Qty: 5 | Fill #1

## 2016-01-19 MED FILL — ?AMLODIPINE BESYLATE 10 MG: 10 | 30 days supply | Qty: 30 | Fill #7

## 2016-01-19 MED FILL — LISINOPRIL-HCTZ 20-12.5 MG: 20-12.5 | 30 days supply | Qty: 30 | Fill #7

## 2016-02-15 MED FILL — LISINOPRIL-HCTZ 20-12.5 MG: 20-12.5 | 30 days supply | Qty: 30 | Fill #8

## 2016-02-15 MED FILL — AMLODIPINE BESYLATE 10 MG T: 10 | 30 days supply | Qty: 30 | Fill #8

## 2016-03-15 MED FILL — AMLODIPINE BESYLATE 10 MG T: 10 | 30 days supply | Qty: 30 | Fill #9

## 2016-03-15 MED FILL — TOUJEO SOLOSTAR 300 UNITS/M: 300 | 90 days supply | Qty: 5 | Fill #2

## 2016-03-15 MED FILL — LISINOPRIL-HCTZ 20-12.5 MG: 20-12.5 | 30 days supply | Qty: 30 | Fill #9

## 2016-03-22 ENCOUNTER — Other Ambulatory Visit: Payer: Self-pay | Admitting: *Deleted

## 2016-03-22 DIAGNOSIS — IMO0001 Reserved for inherently not codable concepts without codable children: Secondary | ICD-10-CM

## 2016-03-22 DIAGNOSIS — E1165 Type 2 diabetes mellitus with hyperglycemia: Principal | ICD-10-CM

## 2016-03-22 MED ORDER — INSULIN GLARGINE 300 UNIT/ML ~~LOC~~ SOPN
10.0000 [IU] | PEN_INJECTOR | SUBCUTANEOUS | Status: DC
Start: 2016-03-22 — End: 2016-07-24

## 2016-04-25 MED FILL — AMLODIPINE BESYLATE 10 MG T: 10 | 30 days supply | Qty: 30 | Fill #10

## 2016-04-25 MED FILL — LISINOPRIL-HCTZ 20-12.5 MG: 20-12.5 | 30 days supply | Qty: 30 | Fill #10

## 2016-05-22 MED FILL — AMLODIPINE BESYLATE 10 MG T: 10 | 30 days supply | Qty: 30 | Fill #11

## 2016-05-22 MED FILL — LISINOPRIL-HCTZ 20-12.5 MG: 20-12.5 | 30 days supply | Qty: 30 | Fill #11

## 2016-06-20 MED FILL — LISINOPRIL-HCTZ 20-12.5 MG: 20-12.5 | 30 days supply | Qty: 30 | Fill #1

## 2016-06-20 MED FILL — TOUJEO SOLOSTAR 300 UNITS/M: 300 | 45 days supply | Qty: 2 | Fill #3

## 2016-06-20 MED FILL — AMLODIPINE BESYLATE 10 MG T: 10 | 30 days supply | Qty: 30 | Fill #0

## 2016-07-20 MED FILL — ?AMLODIPINE BESYLATE 10 MG: 10 | 30 days supply | Qty: 30 | Fill #1

## 2016-07-20 MED FILL — LISINOPRIL-HCTZ 20-12.5 MG: 20-12.5 | 30 days supply | Qty: 30 | Fill #2

## 2016-07-24 ENCOUNTER — Other Ambulatory Visit: Payer: Self-pay | Admitting: Family Medicine

## 2016-07-24 DIAGNOSIS — E1165 Type 2 diabetes mellitus with hyperglycemia: Principal | ICD-10-CM

## 2016-07-24 DIAGNOSIS — IMO0001 Reserved for inherently not codable concepts without codable children: Secondary | ICD-10-CM

## 2016-07-24 MED ORDER — INSULIN GLARGINE 300 UNIT/ML ~~LOC~~ SOPN: 15.0000 [IU] | PEN_INJECTOR | SUBCUTANEOUS | 3 refills | Status: DC

## 2016-07-24 MED FILL — TOUJEO SOLOSTAR 300 UNITS/M: 300 | 30 days supply | Qty: 5 | Fill #0

## 2016-08-27 ENCOUNTER — Other Ambulatory Visit: Payer: Self-pay | Admitting: Family Medicine

## 2016-08-27 DIAGNOSIS — I10 Essential (primary) hypertension: Secondary | ICD-10-CM

## 2016-08-27 MED FILL — $TOUJEO SOLOSTAR 300 UNIT/M: 300 | 30 days supply | Qty: 1 | Fill #1

## 2016-08-27 MED FILL — ?AMLODIPINE BESYLATE 10 MG: 10 | 30 days supply | Qty: 30 | Fill #2

## 2016-08-29 ENCOUNTER — Other Ambulatory Visit: Payer: Self-pay | Admitting: Family Medicine

## 2016-08-29 DIAGNOSIS — I10 Essential (primary) hypertension: Secondary | ICD-10-CM

## 2016-09-18 ENCOUNTER — Other Ambulatory Visit: Payer: Self-pay | Admitting: Family Medicine

## 2016-09-18 DIAGNOSIS — I10 Essential (primary) hypertension: Secondary | ICD-10-CM

## 2016-09-18 MED FILL — $TOUJEO SOLOSTAR 300 UNIT/M: 300 | 30 days supply | Qty: 1 | Fill #2

## 2016-10-26 ENCOUNTER — Other Ambulatory Visit: Payer: Self-pay | Admitting: Family Medicine

## 2016-10-26 DIAGNOSIS — I10 Essential (primary) hypertension: Secondary | ICD-10-CM

## 2016-10-26 MED FILL — $TOUJEO SOLOSTAR 300 UNIT/M: 300 | 30 days supply | Qty: 1 | Fill #3

## 2016-11-22 MED FILL — $TOUJEO SOLOSTAR 300 UNIT/M: 300 | 30 days supply | Qty: 1 | Fill #4

## 2016-12-20 MED FILL — $TOUJEO SOLOSTAR 300 UNIT/M: 300 | 28 days supply | Qty: 1 | Fill #5

## 2017-01-28 MED FILL — $TOUJEO SOLOSTAR 300 UNIT/M: 300 | 20 days supply | Qty: 1 | Fill #6

## 2017-02-22 ENCOUNTER — Other Ambulatory Visit: Payer: Self-pay | Admitting: Family Medicine

## 2017-02-22 DIAGNOSIS — E1165 Type 2 diabetes mellitus with hyperglycemia: Principal | ICD-10-CM

## 2017-02-22 DIAGNOSIS — IMO0001 Reserved for inherently not codable concepts without codable children: Secondary | ICD-10-CM

## 2017-02-22 MED FILL — $TOUJEO SOLOSTAR 300 UNIT/M: 300 | 60 days supply | Qty: 2 | Fill #7

## 2017-04-23 ENCOUNTER — Other Ambulatory Visit: Payer: Self-pay | Admitting: Family Medicine

## 2017-04-23 DIAGNOSIS — E1165 Type 2 diabetes mellitus with hyperglycemia: Principal | ICD-10-CM

## 2017-04-23 DIAGNOSIS — IMO0001 Reserved for inherently not codable concepts without codable children: Secondary | ICD-10-CM

## 2017-04-23 MED FILL — $TOUJEO SOLOSTAR 300 UNIT/M: 300 | 29 days supply | Qty: 1 | Fill #8

## 2017-05-29 ENCOUNTER — Encounter: Payer: Self-pay | Admitting: Family Medicine

## 2017-05-29 ENCOUNTER — Ambulatory Visit: Payer: Self-pay | Attending: Family Medicine | Admitting: Family Medicine

## 2017-05-29 VITALS — BP 191/84 | HR 102 | Temp 98.1°F | Resp 18 | Ht 63.0 in | Wt 124.2 lb

## 2017-05-29 DIAGNOSIS — F17200 Nicotine dependence, unspecified, uncomplicated: Secondary | ICD-10-CM

## 2017-05-29 DIAGNOSIS — Z0000 Encounter for general adult medical examination without abnormal findings: Secondary | ICD-10-CM

## 2017-05-29 DIAGNOSIS — Z794 Long term (current) use of insulin: Secondary | ICD-10-CM | POA: Insufficient documentation

## 2017-05-29 DIAGNOSIS — E1165 Type 2 diabetes mellitus with hyperglycemia: Secondary | ICD-10-CM

## 2017-05-29 DIAGNOSIS — E119 Type 2 diabetes mellitus without complications: Secondary | ICD-10-CM

## 2017-05-29 DIAGNOSIS — Z1231 Encounter for screening mammogram for malignant neoplasm of breast: Secondary | ICD-10-CM

## 2017-05-29 DIAGNOSIS — I10 Essential (primary) hypertension: Secondary | ICD-10-CM

## 2017-05-29 DIAGNOSIS — Z7982 Long term (current) use of aspirin: Secondary | ICD-10-CM | POA: Insufficient documentation

## 2017-05-29 DIAGNOSIS — Z1239 Encounter for other screening for malignant neoplasm of breast: Secondary | ICD-10-CM

## 2017-05-29 DIAGNOSIS — Z79899 Other long term (current) drug therapy: Secondary | ICD-10-CM | POA: Insufficient documentation

## 2017-05-29 DIAGNOSIS — F17210 Nicotine dependence, cigarettes, uncomplicated: Secondary | ICD-10-CM | POA: Insufficient documentation

## 2017-05-29 DIAGNOSIS — E118 Type 2 diabetes mellitus with unspecified complications: Secondary | ICD-10-CM

## 2017-05-29 LAB — POCT UA - MICROALBUMIN
Albumin/Creatinine Ratio, Urine, POC: >300
Creatinine, POC: 100 mg/dL
Microalbumin Ur, POC: 150 mg/L

## 2017-05-29 LAB — POCT GLYCOSYLATED HEMOGLOBIN (HGB A1C): HEMOGLOBIN A1C: 5.5

## 2017-05-29 LAB — GLUCOSE, POCT (MANUAL RESULT ENTRY): POC GLUCOSE: 122 mg/dL — AB (ref 70–99)

## 2017-05-29 MED ORDER — GLUCOSE BLOOD VI STRP: 1.0000 | ORAL_STRIP | Freq: Three times a day (TID) | 11 refills | Status: DC

## 2017-05-29 MED ORDER — INSULIN PEN NEEDLE 31G X 8 MM MISC: 1.0000 "application " | Freq: Every day | 11 refills | Status: DC

## 2017-05-29 MED ORDER — PNEUMOCOCCAL 13-VAL CONJ VACC IM SUSP
0.5000 mL | Freq: Once | INTRAMUSCULAR | Status: AC
  Administered 2017-05-29: 0.5 mL via INTRAMUSCULAR

## 2017-05-29 MED ORDER — NICOTINE 21 MG/24HR TD PT24: 21.0000 mg | MEDICATED_PATCH | Freq: Every day | TRANSDERMAL | 2 refills | Status: DC

## 2017-05-29 MED ORDER — LISINOPRIL-HYDROCHLOROTHIAZIDE 20-12.5 MG PO TABS: 1.0000 | ORAL_TABLET | Freq: Every day | ORAL | 2 refills | Status: DC

## 2017-05-29 MED ORDER — CLONIDINE HCL 0.2 MG PO TABS
0.2000 mg | ORAL_TABLET | Freq: Once | ORAL | Status: AC
  Administered 2017-05-29: 0.2 mg via ORAL

## 2017-05-29 MED ORDER — TRUEPLUS LANCETS 28G MISC: 1.0000 | Freq: Three times a day (TID) | 11 refills | Status: DC

## 2017-05-29 MED ORDER — INSULIN GLARGINE 300 UNIT/ML ~~LOC~~ SOPN: 15.0000 [IU] | PEN_INJECTOR | SUBCUTANEOUS | 3 refills | Status: DC

## 2017-05-29 MED ORDER — CLONIDINE HCL 0.1 MG PO TABS
0.2000 mg | ORAL_TABLET | Freq: Once | ORAL | Status: DC
Start: 2017-05-29 — End: 2017-05-29

## 2017-05-29 MED ORDER — AMLODIPINE BESYLATE 10 MG PO TABS: 10.0000 mg | ORAL_TABLET | Freq: Every day | ORAL | 2 refills | Status: DC

## 2017-05-29 MED FILL — TRUEPLUS PEN NDL 31GX5/16: 31G X 8 MM | 30 days supply | Qty: 100 | Fill #0

## 2017-05-29 MED FILL — !TOUJEO SOLOSTAR 300 UNITS/: 300/ML | 30 days supply | Qty: 1 | Fill #0

## 2017-05-29 MED FILL — TRUE METRIX TEST STRIP: 33 days supply | Qty: 100 | Fill #0

## 2017-05-29 MED FILL — TRUEplus LANCETS 28G MISC: 33 days supply | Qty: 100 | Fill #0

## 2017-05-29 MED FILL — TRUEPLUS PEN NDL 31GX5/16": 31G X 8 MM | 30 days supply | Qty: 100 | Fill #0

## 2017-05-29 MED FILL — LISINOPRIL-HCTZ 20-12.5 MG: 20-12.5 | 30 days supply | Qty: 30 | Fill #0

## 2017-05-29 MED FILL — AMLODIPINE BESYLATE 10 MG T: 10 | 30 days supply | Qty: 30 | Fill #0

## 2017-05-29 NOTE — Progress Notes (Signed)
JAPatient is here for HTN & DM   Patient needs refill on med patient stated that been with out BP med for 9 months

## 2017-05-29 NOTE — Progress Notes (Signed)
Subjective:  Patient ID: [name], female    DOB: 12/04/1965  Age: 51 y.o. MRN: [id number]  CC: Diabetes and Hypertension   HPI [name] presents for Diabetes Mellitus: Patient presents for follow up of diabetes. Symptoms: none.  Patient denies foot ulcerations, nausea, paresthesia of the feet, visual disturbances and weight loss.  Evaluation to date has been included: fasting blood sugar and hemoglobin A1C.  Home sugars: patient reports checkin CBG TID to QID but has not the last 2 weeks due to lack of testing strips. Treatment to date: insulin. History of HTN.  She is not exercising and is not adherent to low salt diet.  She does not check BP at home. She reports not taking her BP medications for 9 months. Cardiac symptoms none. Patient denies chest pain, chest pressure/discomfort, claudication, dyspnea, lower extremity edema, near-syncope and syncope.  Cardiovascular risk factors: diabetes mellitus, hypertension, sedentary lifestyle, smoking/ tobacco exposure. Smokes 1 1/2 packs of cigarettes per day. She is interested in patches to help quit. Use of agents associated with hypertension: none. History of target organ damage: none.   Outpatient Medications Prior to Visit  Medication Sig Dispense Refill  . Blood Glucose Monitoring Suppl (TRUE METRIX METER) W/DEVICE KIT 1 each by Does not apply route as needed. 1 kit 0  . amLODipine (NORVASC) 10 MG tablet TAKE 1 TABLET BY MOUTH DAILY 30 tablet 2  . amLODipine (NORVASC) 10 MG tablet Take 1 tablet (10 mg total) by mouth at bedtime. 90 tablet 3  . aspirin 325 MG EC tablet Take 325 mg by mouth every 6 (six) hours as needed. For pain    . glucose blood (TRUE METRIX BLOOD GLUCOSE TEST) test strip 1 each by Other route 3 (three) times daily. 100 each 11  . Insulin Glargine (TOUJEO SOLOSTAR) 300 UNIT/ML SOPN Inject 15 Units into the skin every morning. 3 pen 3  . Insulin Pen Needle (B-D ULTRAFINE III SHORT PEN) 31G X 8 MM MISC 1 application by  Does not apply route daily. 30 each 11  . Lancet Devices (ACCU-CHEK SOFTCLIX) lancets 1 each by Other route 3 (three) times daily. 1 each 0  . lisinopril-hydrochlorothiazide (PRINZIDE,ZESTORETIC) 20-12.5 MG tablet Take 1 tablet by mouth daily. 90 tablet 3  . TRUEPLUS LANCETS 28G MISC 1 each by Does not apply route 3 (three) times daily. 100 each 11   No facility-administered medications prior to visit.     ROS Review of Systems  Constitutional: Negative.   Eyes: Negative.   Cardiovascular: Negative.   Gastrointestinal: Negative.   Musculoskeletal: Negative.   Skin: Negative.   Neurological: Negative.   Psychiatric/Behavioral: Negative.    Objective:  BP (!) 191/84 (BP Location: Left Arm, Patient Position: Sitting, Cuff Size: Normal)   Pulse (!) 102   Temp 98.1 F (36.7 C) (Oral)   Resp 18   Ht '5\' 3"'  (1.6 m)   Wt 124 lb 3.2 oz (56.3 kg)   SpO2 98%   BMI 22.00 kg/m   BP/Weight 05/29/2017 03/28/2015 04/02/5008  Systolic BP 381 829 937  Diastolic BP 84 73 90  Wt. (Lbs) 124.2 121 122  BMI 22 22.13 22.31   Physical Exam  Constitutional: She is oriented to person, place, and time. She appears well-developed and well-nourished.  Eyes: Pupils are equal, round, and reactive to light. Conjunctivae are normal.  Neck: No JVD present.  Cardiovascular: Normal rate, regular rhythm, normal heart sounds and intact distal pulses.   Pulmonary/Chest: Effort normal and breath  sounds normal.  Abdominal: Soft. Bowel sounds are normal. There is no tenderness.  Neurological: She is alert and oriented to person, place, and time.  Skin: Skin is warm and dry.  Nursing note and vitals reviewed.  Diabetic Foot Exam - Simple   Simple Foot Form Diabetic Foot exam was performed with the following findings:  Yes 05/29/2017 10:16 AM  Visual Inspection No deformities, no ulcerations, no other skin breakdown bilaterally:  Yes Sensation Testing Intact to touch and monofilament testing bilaterally:   Yes Pulse Check Posterior Tibialis and Dorsalis pulse intact bilaterally:  Yes Comments      Assessment & Plan:   Problem List Items Addressed This Visit      Cardiovascular and Mediastinum   HTN (hypertension), malignant (Chronic)   Schedule BP recheck in 2 weeks with nurse.   If BP is greater than 90/60 (MAP 65 or greater) but not less than 130/80 may increase d/c prinzide, and prescribe lisinopril 40 mg QD and HCTZ 25 mg QD and recheck in another 2 weeks.    Follow up with PCP in 3 months.   Relevant Medications   amLODipine (NORVASC) 10 MG tablet   lisinopril-hydrochlorothiazide (PRINZIDE,ZESTORETIC) 20-12.5 MG tablet   cloNIDine (CATAPRES) tablet 0.2 mg (Completed)   Other Relevant Orders   POCT UA - Microalbumin (Completed)     Endocrine   Diabetes type 2, uncontrolled (HCC) - Primary (Chronic)   Relevant Medications   lisinopril-hydrochlorothiazide (PRINZIDE,ZESTORETIC) 20-12.5 MG tablet   Insulin Glargine (TOUJEO SOLOSTAR) 300 UNIT/ML SOPN   Insulin Pen Needle (B-D ULTRAFINE III SHORT PEN) 31G X 8 MM MISC   glucose blood (TRUE METRIX BLOOD GLUCOSE TEST) test strip   TRUEPLUS LANCETS 28G MISC    Other Visit Diagnoses    Healthcare maintenance       Relevant Medications   pneumococcal 13-valent conjugate vaccine (PREVNAR 13) injection 0.5 mL (Completed)   Uncontrolled hypertension       Relevant Medications   amLODipine (NORVASC) 10 MG tablet   lisinopril-hydrochlorothiazide (PRINZIDE,ZESTORETIC) 20-12.5 MG tablet   cloNIDine (CATAPRES) tablet 0.2 mg (Completed)   Screening for breast cancer       Relevant Orders   MM SCREENING BREAST TOMO BILATERAL   Current every day smoker       Relevant Medications   nicotine (NICODERM CQ) 21 mg/24hr patch      Meds ordered this encounter  Medications  . DISCONTD: cloNIDine (CATAPRES) tablet 0.2 mg  . amLODipine (NORVASC) 10 MG tablet    Sig: Take 1 tablet (10 mg total) by mouth at bedtime.    Dispense:  30 tablet     Refill:  2    Order Specific Question:   Supervising Provider    Answer:   Tresa Garter [id number]  . lisinopril-hydrochlorothiazide (PRINZIDE,ZESTORETIC) 20-12.5 MG tablet    Sig: Take 1 tablet by mouth daily.    Dispense:  30 tablet    Refill:  2    Order Specific Question:   Supervising Provider    Answer:   Tresa Garter [id number]  . Insulin Glargine (TOUJEO SOLOSTAR) 300 UNIT/ML SOPN    Sig: Inject 15 Units into the skin every morning.    Dispense:  3 pen    Refill:  3    Order Specific Question:   Supervising Provider    Answer:   Tresa Garter [id number]  . Insulin Pen Needle (B-D ULTRAFINE III SHORT PEN) 31G X 8 MM MISC    Sig: 1  application by Does not apply route daily.    Dispense:  30 each    Refill:  11    Order Specific Question:   Supervising Provider    Answer:   Tresa Garter [id number]  . glucose blood (TRUE METRIX BLOOD GLUCOSE TEST) test strip    Sig: 1 each by Other route 3 (three) times daily.    Dispense:  100 each    Refill:  11    Order Specific Question:   Supervising Provider    Answer:   Tresa Garter [id number]  . TRUEPLUS LANCETS 28G MISC    Sig: 1 each by Does not apply route 3 (three) times daily.    Dispense:  100 each    Refill:  11    Order Specific Question:   Supervising Provider    Answer:   Tresa Garter [id number]  . pneumococcal 13-valent conjugate vaccine (PREVNAR 13) injection 0.5 mL  . nicotine (NICODERM CQ) 21 mg/24hr patch    Sig: Place 1 patch (21 mg total) onto the skin daily.    Dispense:  28 patch    Refill:  2    Order Specific Question:   Supervising Provider    Answer:   Tresa Garter [id number]  . cloNIDine (CATAPRES) tablet 0.2 mg    Follow-up: Return in about 2 weeks (around 06/12/2017) for BP check [place].   Alfonse Spruce FNP

## 2017-05-29 NOTE — Patient Instructions (Signed)
Hypertension Hypertension is another name for high blood pressure. High blood pressure forces your heart to work harder to pump blood. This can cause problems over time. There are two numbers in a blood pressure reading. There is a top number (systolic) over a bottom number (diastolic). It is best to have a blood pressure below 120/80. Healthy choices can help lower your blood pressure. You may need medicine to help lower your blood pressure if:  Your blood pressure cannot be lowered with healthy choices.  Your blood pressure is higher than 130/80.  Follow these instructions at home: Eating and drinking  If directed, follow the DASH eating plan. This diet includes: ? Filling half of your plate at each meal with fruits and vegetables. ? Filling one quarter of your plate at each meal with whole grains. Whole grains include whole wheat pasta, brown rice, and whole grain bread. ? Eating or drinking low-fat dairy products, such as skim milk or low-fat yogurt. ? Filling one quarter of your plate at each meal with low-fat (lean) proteins. Low-fat proteins include fish, skinless chicken, eggs, beans, and tofu. ? Avoiding fatty meat, cured and processed meat, or chicken with skin. ? Avoiding premade or processed food.  Eat less than 1,500 mg of salt (sodium) a day.  Limit alcohol use to no more than 1 drink a day for nonpregnant women and 2 drinks a day for men. One drink equals 12 oz of beer, 5 oz of wine, or 1 oz of hard liquor. Lifestyle  Work with your doctor to stay at a healthy weight or to lose weight. Ask your doctor what the best weight is for you.  Get at least 30 minutes of exercise that causes your heart to beat faster (aerobic exercise) most days of the week. This may include walking, swimming, or biking.  Get at least 30 minutes of exercise that strengthens your muscles (resistance exercise) at least 3 days a week. This may include lifting weights or pilates.  Do not use any  products that contain nicotine or tobacco. This includes cigarettes and e-cigarettes. If you need help quitting, ask your doctor.  Check your blood pressure at home as told by your doctor.  Keep all follow-up visits as told by your doctor. This is important. Medicines  Take over-the-counter and prescription medicines only as told by your doctor. Follow directions carefully.  Do not skip doses of blood pressure medicine. The medicine does not work as well if you skip doses. Skipping doses also puts you at risk for problems.  Ask your doctor about side effects or reactions to medicines that you should watch for. Contact a doctor if:  You think you are having a reaction to the medicine you are taking.  You have headaches that keep coming back (recurring).  You feel dizzy.  You have swelling in your ankles.  You have trouble with your vision. Get help right away if:  You get a very bad headache.  You start to feel confused.  You feel weak or numb.  You feel faint.  You get very bad pain in your: ? Chest. ? Belly (abdomen).  You throw up (vomit) more than once.  You have trouble breathing. Summary  Hypertension is another name for high blood pressure.  Making healthy choices can help lower blood pressure. If your blood pressure cannot be controlled with healthy choices, you may need to take medicine. This information is not intended to replace advice given to you by your health care   provider. Make sure you discuss any questions you have with your health care provider. Document Released: 03/05/2008 Document Revised: 08/15/2016 Document Reviewed: 08/15/2016 Elsevier Interactive Patient Education  2018 [employer].   Type 2 Diabetes Mellitus, Self Care, Adult When you have type 2 diabetes (type 2 diabetes mellitus), you must keep your blood sugar (glucose) under control. You can do this with:  Nutrition.  Exercise.  Lifestyle changes.  Medicines or insulin, if  needed.  Support from your doctors and others.  How do I manage my blood sugar?  Check your blood sugar level every day, as often as told.  Call your doctor if your blood sugar is above your goal numbers for 2 tests in a row.  Have your A1c (hemoglobin A1c) level checked at least two times a year. Have it checked more often if your doctor tells you to. Your doctor will set treatment goals for you. Generally, you should have these blood sugar levels:  Before meals (preprandial): 80-130 mg/dL (4.4-7.2 mmol/L).  After meals (postprandial): lower than 180 mg/dL (10 mmol/L).  A1c level: less than 7%.  What do I need to know about high blood sugar? High blood sugar is called hyperglycemia. Know the signs of high blood sugar. Signs may include:  Feeling: ? Thirsty. ? Hungry. ? Very tired.  Needing to pee (urinate) more than usual.  Blurry vision.  What do I need to know about low blood sugar? Low blood sugar is called hypoglycemia. This is when blood sugar is at or below 70 mg/dL (3.9 mmol/L). Symptoms may include:  Feeling: ? Hungry. ? Worried or nervous (anxious). ? Sweaty and clammy. ? Confused. ? Dizzy. ? Sleepy. ? Sick to your stomach (nauseous).  Having: ? A fast heartbeat (palpitations). ? A headache. ? A change in your vision. ? Jerky movements that you cannot control (seizure). ? Nightmares. ? Tingling or no feeling (numbness) around the mouth, lips, or tongue.  Having trouble with: ? Talking. ? Paying attention (concentrating). ? Moving (coordination). ? Sleeping.  Shaking.  Passing out (fainting).  Getting upset easily (irritability).  Treating low blood sugar  To treat low blood sugar, eat or drink something sugary right away. If you can think clearly and swallow safely, follow the 15:15 rule:  Take 15 grams of a fast-acting carb (carbohydrate). Some fast-acting carbs are: ? 1 tube of glucose gel. ? 3 sugar tablets (glucose pills). ? 6-8  pieces of hard candy. ? 4 oz (120 mL) of fruit juice. ? 4 oz (120 mL) regular (not diet) soda.  Check your blood sugar 15 minutes after you take the carb.  If your blood sugar is still at or below 70 mg/dL (3.9 mmol/L), take 15 grams of a carb again.  If your blood sugar does not go above 70 mg/dL (3.9 mmol/L) after 3 tries, get help right away.  After your blood sugar goes back to normal, eat a meal or a snack within 1 hour.  Treating very low blood sugar If your blood sugar is at or below 54 mg/dL (3 mmol/L), you have very low blood sugar (severe hypoglycemia). This is an emergency. Do not wait to see if the symptoms will go away. Get medical help right away. Call your local emergency services (911 in the [place] Do not drive yourself to the hospital. If you have very low blood sugar and you cannot eat or drink, you may need a glucagon shot (injection). A family member or friend should learn how to check  your blood sugar and how to give you a glucagon shot. Ask your doctor if you need to have a glucagon shot kit at home. What else is important to manage my diabetes? Medicine Follow these instructions about insulin and diabetes medicines:  Take them as told by your doctor.  Adjust them as told by your doctor.  Do not run out of them.  Having diabetes can raise your risk for other long-term conditions. These include heart or kidney disease. Your doctor may prescribe medicines to help prevent problems from diabetes. Food   Make healthy food choices. These include: ? Chicken, fish, egg whites, and beans. ? Oats, whole wheat, bulgur, brown rice, quinoa, and millet. ? Fresh fruits and vegetables. ? Low-fat dairy products. ? Nuts, avocado, olive oil, and canola oil.  Make a food plan with a specialist (dietitian).  Follow instructions from your doctor about what you cannot eat or drink.  Drink enough fluid to keep your pee (urine) clear or pale yellow.  Eat healthy snacks between  healthy meals.  Keep track of carbs that you eat. Read food labels. Learn food serving sizes.  Follow your sick day plan when you cannot eat or drink normally. Make this plan with your doctor so it is ready to use. Activity  Exercise at least 3 times a week.  Do not go more than 2 days without exercising.  Talk with your doctor before you start a new exercise. Your doctor may need to adjust your insulin, medicines, or food. Lifestyle   Do not use any tobacco products. These include cigarettes, chewing tobacco, and e-cigarettes.If you need help quitting, ask your doctor.  Ask your doctor how much alcohol is safe for you.  Learn to deal with stress. If you need help with this, ask your doctor. Body care  Stay up to date with your shots (immunizations).  Have your eyes and feet checked by a doctor as often as told.  Check your skin and feet every day. Check for cuts, bruises, redness, blisters, or sores.  Brush your teeth and gums two times a day.  Floss at least one time a day.  Go to the dentist least one time every 6 months.  Stay at a healthy weight. General instructions   Take over-the-counter and prescription medicines only as told by your doctor.  Share your diabetes care plan with: ? Your work or school. ? People you live with.  Check your pee (urine) for ketones: ? When you are sick. ? As told by your doctor.  Carry a card or wear jewelry that says that you have diabetes.  Ask your doctor: ? Do I need to meet with a diabetes educator? ? Where can I find a support group for people with diabetes?  Keep all follow-up visits as told by your doctor. This is important. Where to find more information: To learn more about diabetes, visit:  American Diabetes Association: www.diabetes.org  American Association of Diabetes Educators: www.diabeteseducator.org/patient-resources  This information is not intended to replace advice given to you by your health care  provider. Make sure you discuss any questions you have with your health care provider. Document Released: 01/09/2016 Document Revised: 02/23/2016 Document Reviewed: 10/21/2015 Elsevier Interactive Patient Education  [employer].

## 2017-05-30 LAB — CMP AND LIVER
ALBUMIN: 5.2 g/dL (ref 3.5–5.5)
ALK PHOS: 107 IU/L (ref 39–117)
ALT: 85 IU/L — ABNORMAL HIGH (ref 0–32)
AST: 64 IU/L — AB (ref 0–40)
BILIRUBIN TOTAL: 0.5 mg/dL (ref 0.0–1.2)
BILIRUBIN, DIRECT: 0.18 mg/dL (ref 0.00–0.40)
BUN: 12 mg/dL (ref 6–24)
CHLORIDE: 97 mmol/L (ref 96–106)
CO2: 20 mmol/L (ref 20–29)
Calcium: 10.1 mg/dL (ref 8.7–10.2)
Creatinine, Ser: 0.49 mg/dL — ABNORMAL LOW (ref 0.57–1.00)
GFR calc Af Amer: 130 mL/min/{1.73_m2} (ref >59)
GFR calc non Af Amer: 113 mL/min/{1.73_m2} (ref >59)
Glucose: 112 mg/dL — ABNORMAL HIGH (ref 65–99)
POTASSIUM: 4.6 mmol/L (ref 3.5–5.2)
SODIUM: 138 mmol/L (ref 134–144)
TOTAL PROTEIN: 7.8 g/dL (ref 6.0–8.5)

## 2017-05-30 LAB — LIPID PANEL
CHOLESTEROL TOTAL: 371 mg/dL — AB (ref 100–199)
Chol/HDL Ratio: 5 ratio — ABNORMAL HIGH (ref 0.0–4.4)
HDL: 74 mg/dL (ref >39)
LDL CALC: 264 mg/dL — AB (ref 0–99)
TRIGLYCERIDES: 167 mg/dL — AB (ref 0–149)
VLDL Cholesterol Cal: 33 mg/dL (ref 5–40)

## 2017-06-04 ENCOUNTER — Telehealth: Payer: Self-pay

## 2017-06-04 ENCOUNTER — Other Ambulatory Visit: Payer: Self-pay | Admitting: Family Medicine

## 2017-06-04 DIAGNOSIS — R945 Abnormal results of liver function studies: Secondary | ICD-10-CM

## 2017-06-04 DIAGNOSIS — E782 Mixed hyperlipidemia: Secondary | ICD-10-CM | POA: Insufficient documentation

## 2017-06-04 DIAGNOSIS — R7989 Other specified abnormal findings of blood chemistry: Secondary | ICD-10-CM

## 2017-06-04 MED ORDER — ATORVASTATIN CALCIUM 20 MG PO TABS: 20.0000 mg | ORAL_TABLET | Freq: Every day | ORAL | 2 refills | Status: DC

## 2017-06-04 MED FILL — ATORVASTATIN 20 MG TABLET: 20 | 30 days supply | Qty: 30 | Fill #0

## 2017-06-04 NOTE — Telephone Encounter (Signed)
CMA call regarding lab results   Patient did not answer but left a detailed message  & if have any questions just to call back

## 2017-06-04 NOTE — Telephone Encounter (Signed)
-----   Message from Alfonse Spruce, [place] sent at 06/04/2017  7:36 AM EDT ----- Lipid levels were elevated. This can increase your risk of heart disease overtime. You will be prescribed atorvastatin. Start eating a diet low in saturated fat. Limit your intake of fried foods, red meats, and whole milk. Increase activity. Recommend up in 3 months. Kidney function normal Liver function labs have improved from previous labs but are still elevated. This can indicate decreased liver function. Avoid alcohol and Tylenol. Begin eating a diet lower in fat. Decrease your intake of fried foods and whole milk. Recommend screening for hepatitis .

## 2017-06-13 ENCOUNTER — Ambulatory Visit: Payer: Self-pay | Attending: Family Medicine | Admitting: *Deleted

## 2017-06-13 VITALS — BP 160/80

## 2017-06-13 DIAGNOSIS — Z0130 Encounter for examination of blood pressure without abnormal findings: Secondary | ICD-10-CM

## 2017-06-13 DIAGNOSIS — Z048 Encounter for examination and observation for other specified reasons: Secondary | ICD-10-CM | POA: Insufficient documentation

## 2017-06-13 DIAGNOSIS — I10 Essential (primary) hypertension: Secondary | ICD-10-CM | POA: Insufficient documentation

## 2017-06-13 MED ORDER — HYDROCHLOROTHIAZIDE 25 MG PO TABS: 25.0000 mg | ORAL_TABLET | Freq: Every day | ORAL | 3 refills | Status: DC

## 2017-06-13 MED ORDER — LISINOPRIL 40 MG PO TABS: 40.0000 mg | ORAL_TABLET | Freq: Every day | ORAL | 3 refills | Status: DC

## 2017-06-13 MED FILL — HYDROCHLOROTHIAZIDE 25 MG T: 25 | 30 days supply | Qty: 30 | Fill #0

## 2017-06-13 MED FILL — LISINOPRIL 40 MG TABLET: 40 | 30 days supply | Qty: 30 | Fill #0

## 2017-06-13 NOTE — Patient Instructions (Signed)
d/c prinzide Take lisinopril 40 mg QD and HCTZ 25 mg daily   recheck in another 2 weeks 06/27/2017

## 2017-06-13 NOTE — Progress Notes (Signed)
Pt arrived to [facility],  alert and oriented and arrives in good spirits. Last OV  05/29/2017 with PCP.  At office visit then, patient BP was 191/84.  Pt denies chest pain, SOB, HA, dizziness, or blurred vision.  Reviewed medication with patient. Pt states medication was taken this morning.  Manual blood pressure reading: 150/80 and 160/80  Plan-   d/c prinzide prescribe lisinopril 40 mg QD and HCTZ 25 mg QD recheck in another 2 weeks.

## 2017-06-18 ENCOUNTER — Other Ambulatory Visit: Payer: Self-pay | Admitting: *Deleted

## 2017-06-18 MED ORDER — INSULIN GLARGINE 300 UNIT/ML ~~LOC~~ SOPN: 15.0000 [IU] | PEN_INJECTOR | SUBCUTANEOUS | 3 refills | Status: DC

## 2017-06-18 NOTE — Telephone Encounter (Signed)
PRINTED FOR PASS PROGRAM 

## 2017-06-24 MED FILL — !TOUJEO SOLOSTAR 300 UNITS/: 300/ML | 30 days supply | Qty: 1 | Fill #1

## 2017-06-27 ENCOUNTER — Ambulatory Visit: Payer: Self-pay | Attending: Family Medicine | Admitting: *Deleted

## 2017-06-27 VITALS — BP 162/80 | HR 102

## 2017-06-27 DIAGNOSIS — I10 Essential (primary) hypertension: Secondary | ICD-10-CM | POA: Insufficient documentation

## 2017-06-27 DIAGNOSIS — Z7689 Persons encountering health services in other specified circumstances: Secondary | ICD-10-CM | POA: Insufficient documentation

## 2017-06-27 NOTE — Patient Instructions (Addendum)
-  Take blood pressure medication highlighted on second page. -Return in 1 week to recheck your blood pressure

## 2017-06-27 NOTE — Progress Notes (Signed)
Pt arrived to [facility], alert, oriented and in good spirits.    Pt denies chest pain, SOB, HA, dizziness, or blurred vision.  Reviewed medication. Pt states she has not been taking the amlodipine 10 mg. She states she thought she was supposed to discontine this. She states medications lisinopril and HCTZ was taken this morning.  The plan was for patient to stop prinzide She was start lisinopril 40 mg  and HCTZ 25 mg daily.    Manual blood pressure reading: 162/80 and 158/80  PLAN Take Amlodipine 10 mg at bedtime Continue Lisinopril 40 mg and HCTZ 25 mg daily Return in 1 week to recheck.  Will notify and route note to PCP.

## 2017-07-01 MED FILL — ATORVASTATIN 20 MG TABLET: 20 | 30 days supply | Qty: 30 | Fill #1

## 2017-07-01 MED FILL — AMLODIPINE BESYLATE 10 MG T: 10 | 30 days supply | Qty: 30 | Fill #1

## 2017-07-04 ENCOUNTER — Ambulatory Visit: Payer: Self-pay | Attending: Family Medicine | Admitting: *Deleted

## 2017-07-04 VITALS — BP 140/70 | HR 87

## 2017-07-04 DIAGNOSIS — I10 Essential (primary) hypertension: Secondary | ICD-10-CM | POA: Insufficient documentation

## 2017-07-04 DIAGNOSIS — Z0130 Encounter for examination of blood pressure without abnormal findings: Secondary | ICD-10-CM

## 2017-07-04 DIAGNOSIS — Z0489 Encounter for examination and observation for other specified reasons: Secondary | ICD-10-CM | POA: Insufficient documentation

## 2017-07-04 DIAGNOSIS — F17200 Nicotine dependence, unspecified, uncomplicated: Secondary | ICD-10-CM

## 2017-07-04 MED ORDER — CARVEDILOL 3.125 MG PO TABS: 3.1250 mg | ORAL_TABLET | Freq: Two times a day (BID) | ORAL | 0 refills | Status: DC

## 2017-07-04 MED ORDER — NICOTINE 21 MG/24HR TD PT24: 21.0000 mg | MEDICATED_PATCH | Freq: Every day | TRANSDERMAL | 2 refills | Status: DC

## 2017-07-04 MED FILL — ?CARVEDILOL 3.125 MG TABLET: 3.125 | 30 days supply | Qty: 60 | Fill #0

## 2017-07-04 MED FILL — NICOTINE 21 MG/24HR PATCH: 21 | 28 days supply | Qty: 28 | Fill #0

## 2017-07-04 NOTE — Progress Notes (Signed)
Pt arrived to [facility], oriented and arrives in good spirits. She returns for nurse visit: blood pressure check.   Pt denies chest pain, SOB, HA, dizziness, or blurred vision.  Reviewed medication with patient. Pt states she has been taking medication as prescribed.   Manual blood pressure reading: 140/70   PLAN-  Add Coreg 3.125 mg BID  Apply Nicotine patch to skin daily for smoking cessation. Recheck BP in 2 weeks.

## 2017-07-04 NOTE — Patient Instructions (Signed)
Take  Coreg 3.125 mg twice daily in addition to your other medications. Recheck BP in 2 weeks

## 2017-07-15 MED FILL — HYDROCHLOROTHIAZIDE 25 MG T: 25 | 30 days supply | Qty: 30 | Fill #1

## 2017-07-15 MED FILL — LISINOPRIL 40 MG TABLET: 40 | 30 days supply | Qty: 30 | Fill #1

## 2017-07-18 ENCOUNTER — Ambulatory Visit: Payer: Self-pay | Attending: Family Medicine | Admitting: *Deleted

## 2017-07-18 VITALS — BP 140/70

## 2017-07-18 DIAGNOSIS — Z79899 Other long term (current) drug therapy: Secondary | ICD-10-CM | POA: Insufficient documentation

## 2017-07-18 DIAGNOSIS — I10 Essential (primary) hypertension: Secondary | ICD-10-CM | POA: Insufficient documentation

## 2017-07-18 DIAGNOSIS — Z0130 Encounter for examination of blood pressure without abnormal findings: Secondary | ICD-10-CM

## 2017-07-18 NOTE — Progress Notes (Signed)
Pt arrived to [facility]. Pt alert and oriented. She states she is very tired from working last night. She reports she did not go to sleep until 0200.   Pt denies chest pain, SOB, HA, dizziness, or blurred vision.  Verified medication. Pt states medication was taken this morning.  Manual blood pressure reading: 140/70  She request to keep medication the same. She states she has not had any sleep and is very tired. She takes blood pressure at home. Suggest to call office with BP readings in 1 week and can f/u with PCP if BP is not at goal.  Will route message to PCP.

## 2017-07-23 ENCOUNTER — Emergency Department (HOSPITAL_BASED_OUTPATIENT_CLINIC_OR_DEPARTMENT_OTHER): Payer: Self-pay

## 2017-07-23 ENCOUNTER — Emergency Department (HOSPITAL_BASED_OUTPATIENT_CLINIC_OR_DEPARTMENT_OTHER)
Admission: EM | Admit: 2017-07-23 | Discharge: 2017-07-23 | Disposition: A | Discharge status: Home / Self Care | Payer: Self-pay | Attending: Emergency Medicine | Admitting: Emergency Medicine

## 2017-07-23 ENCOUNTER — Encounter (HOSPITAL_BASED_OUTPATIENT_CLINIC_OR_DEPARTMENT_OTHER): Payer: Self-pay | Admitting: *Deleted

## 2017-07-23 DIAGNOSIS — Z794 Long term (current) use of insulin: Secondary | ICD-10-CM | POA: Insufficient documentation

## 2017-07-23 DIAGNOSIS — W19XXXA Unspecified fall, initial encounter: Secondary | ICD-10-CM

## 2017-07-23 DIAGNOSIS — M545 Low back pain, unspecified: Secondary | ICD-10-CM

## 2017-07-23 DIAGNOSIS — Y93E1 Activity, personal bathing and showering: Secondary | ICD-10-CM | POA: Insufficient documentation

## 2017-07-23 DIAGNOSIS — W182XXA Fall in (into) shower or empty bathtub, initial encounter: Secondary | ICD-10-CM | POA: Insufficient documentation

## 2017-07-23 DIAGNOSIS — Y92012 Bathroom of single-family (private) house as the place of occurrence of the external cause: Secondary | ICD-10-CM | POA: Insufficient documentation

## 2017-07-23 DIAGNOSIS — M25551 Pain in right hip: Secondary | ICD-10-CM | POA: Insufficient documentation

## 2017-07-23 DIAGNOSIS — E119 Type 2 diabetes mellitus without complications: Secondary | ICD-10-CM | POA: Insufficient documentation

## 2017-07-23 DIAGNOSIS — I10 Essential (primary) hypertension: Secondary | ICD-10-CM | POA: Insufficient documentation

## 2017-07-23 DIAGNOSIS — Z79899 Other long term (current) drug therapy: Secondary | ICD-10-CM | POA: Insufficient documentation

## 2017-07-23 DIAGNOSIS — Y998 Other external cause status: Secondary | ICD-10-CM | POA: Insufficient documentation

## 2017-07-23 DIAGNOSIS — F17200 Nicotine dependence, unspecified, uncomplicated: Secondary | ICD-10-CM | POA: Insufficient documentation

## 2017-07-23 MED ORDER — KETOROLAC TROMETHAMINE 60 MG/2ML IM SOLN
30.0000 mg | Freq: Once | INTRAMUSCULAR | Status: AC
  Administered 2017-07-23: 30 mg via INTRAMUSCULAR
  Filled 2017-07-23: qty 2

## 2017-07-23 MED ORDER — METHOCARBAMOL 750 MG PO TABS: 750.0000 mg | ORAL_TABLET | Freq: Four times a day (QID) | ORAL | 0 refills | Status: DC | PRN

## 2017-07-23 MED FILL — METHOCARBAMOL 750 MG TABLET: 750 | 6 days supply | Qty: 25 | Fill #0

## 2017-07-23 NOTE — ED Triage Notes (Signed)
She slipped and fell in the shower 3 days ago. Pain in her right hip with radiation down her leg. She is ambulatory. Pain is burning in nature.

## 2017-07-23 NOTE — ED Provider Notes (Signed)
[facility] EMERGENCY DEPARTMENT Provider Note   CSN: [id number] Arrival date & time: 07/23/17  1402     History   Chief Complaint Chief Complaint  Patient presents with  . Fall    HPI [name] is a 51 y.o. female.  The history is provided by the patient.  Fall  This is a new problem. The current episode started more than 2 days ago. The problem occurs rarely. The problem has been resolved. Pertinent negatives include no chest pain, no abdominal pain, no headaches and no shortness of breath. The symptoms are aggravated by walking. Nothing relieves the symptoms. She has tried acetaminophen and a warm compress for the symptoms. The treatment provided no relief.   51 year old female who presents with low back pain and right hip pain after a mechanical fall. History of DM, HTN, HLD. Is not anticoagulated. Slipped while shaving her legs in the bathtub 2-3 days ago. Fell on right hip and low back. No head strike or LOC. No neck pain or back pain. Able to ambulate, but with worsening pain in low back and right hip. No numbness/weakness, loss of control of bowel or urine.  Past Medical History:  Diagnosis Date  . Anxiety    At age of 56  . Gestational diabetes 39   when pregnant with daughter   . Hypertension 1999   At age of 58    Patient Active Problem List   Diagnosis Date Noted  . Mixed hyperlipidemia 06/04/2017  . Alcoholic hepatitis without ascites 02/23/2015  . HTN (hypertension), malignant 02/21/2015  . Current smoker 02/21/2015  . Diabetes type 2, uncontrolled ([place]) 02/21/2015  . Poor dentition 02/21/2015  . ETOH abuse 02/21/2015    Past Surgical History:  Procedure Laterality Date  . ABDOMINAL HYSTERECTOMY      OB History    No data available       Home Medications    Prior to Admission medications   Medication Sig Start Date End Date Taking? Authorizing Provider  amLODipine (NORVASC) 10 MG tablet Take 1 tablet (10 mg total) by mouth at  bedtime. 05/29/17   Alfonse Spruce, FNP  atorvastatin (LIPITOR) 20 MG tablet Take 1 tablet (20 mg total) by mouth daily. 06/04/17   Alfonse Spruce, FNP  Blood Glucose Monitoring Suppl (TRUE METRIX METER) W/DEVICE KIT 1 each by Does not apply route as needed. 02/21/15   Boykin Nearing, MD  carvedilol (COREG) 3.125 MG tablet Take 1 tablet (3.125 mg total) by mouth 2 (two) times daily with a meal. 07/04/17   Hairston, Mandesia R, FNP  glucose blood (TRUE METRIX BLOOD GLUCOSE TEST) test strip 1 each by Other route 3 (three) times daily. 05/29/17   Alfonse Spruce, FNP  hydrochlorothiazide (HYDRODIURIL) 25 MG tablet Take 1 tablet (25 mg total) by mouth daily. 06/13/17   Alfonse Spruce, FNP  Insulin Glargine (TOUJEO SOLOSTAR) 300 UNIT/ML SOPN Inject 15 Units into the skin every morning. 06/18/17   Tresa Garter, MD  Insulin Pen Needle (B-D ULTRAFINE III SHORT PEN) 31G X 8 MM MISC 1 application by Does not apply route daily. 05/29/17   Alfonse Spruce, FNP  lisinopril (PRINIVIL,ZESTRIL) 40 MG tablet Take 1 tablet (40 mg total) by mouth daily. 06/13/17   Alfonse Spruce, FNP  methocarbamol (ROBAXIN-750) 750 MG tablet Take 1 tablet (750 mg total) by mouth every 6 (six) hours as needed for muscle spasms. 07/23/17   Forde Dandy, MD  nicotine (  NICODERM CQ) 21 mg/24hr patch Place 1 patch (21 mg total) onto the skin daily. Patient not taking: Reported on 07/18/2017 07/04/17   Alfonse Spruce, FNP  TRUEPLUS LANCETS 28G MISC 1 each by Does not apply route 3 (three) times daily. 05/29/17   Alfonse Spruce, FNP    Family History Family History  Problem Relation Age of Onset  . Hypertension Mother   . Diabetes Father   . Hypertension Sister   . Hypertension Brother     Social History Social History  Substance Use Topics  . Smoking status: Current Every Day Smoker  . Smokeless tobacco: Never Used  . Alcohol use 0.0 oz/week     Comment: 40 oz daily       Allergies   Patient has no known allergies.   Review of Systems Review of Systems  Constitutional: Negative for fever.  Respiratory: Negative for shortness of breath.   Cardiovascular: Negative for chest pain.  Gastrointestinal: Negative for abdominal pain.  Neurological: Negative for headaches.  Hematological: Does not bruise/bleed easily.     Physical Exam Updated Vital Signs BP (!) 136/93   Pulse 97   Temp 97.7 F (36.5 C) (Oral)   Resp 14   Ht _0  (1.6 m)   Wt 56.2 kg (124 lb)   SpO2 99%   BMI 21.97 kg/m   Physical Exam Physical Exam  Nursing note and vitals reviewed. Constitutional: Well developed, well nourished, non-toxic, and in no acute distress Head: Normocephalic and atraumatic.  Mouth/Throat: Oropharynx is clear and moist.  Neck: Normal range of motion. Neck supple.  Cardiovascular: Normal rate and regular rhythm.  +2 DP pulses Pulmonary/Chest: Effort normal and breath sounds normal.  Abdominal: Soft. There is no tenderness. There is no rebound and no guarding.  Musculoskeletal: Normal range of motion. low lumbar spine tenderness to palpation without step-offs or deformities. Neurological: Alert, no facial droop, fluent speech, full strength in bilateral lower extremities, +2 patellar and Achilles reflexes bilaterally, sensation to light touch intact in bilateral lower extremities Skin: Skin is warm and dry.  Psychiatric: Cooperative   ED Treatments / Results  Labs (all labs ordered are listed, but only abnormal results are displayed) Labs Reviewed - No data to display  EKG  EKG Interpretation None       Radiology Dg Lumbar Spine Complete  Result Date: 07/23/2017 CLINICAL DATA:  Persistent pain after fall 3 days prior EXAM: LUMBAR SPINE - COMPLETE 4+ VIEW COMPARISON:  None. FINDINGS: Frontal, lateral, spot lumbosacral lateral, and bilateral oblique views were obtained. There are 5 non-rib-bearing lumbar type vertebral bodies. There is  no fracture or spondylolisthesis. The disc spaces appear normal. There is no appreciable facet arthropathy. There is aortoiliac atherosclerosis. IMPRESSION: No fracture or spondylolisthesis. No appreciable arthropathy. There is aortoiliac atherosclerosis. Aortic Atherosclerosis (ICD10-I70.0). Electronically Signed   By: Lowella Grip III M.D.   On: 07/23/2017 15:36   Dg Hip Unilat  With Pelvis 2-3 Views Right  Result Date: 07/23/2017 CLINICAL DATA:  51 year old who fell in the shower at home 3 days ago, persistent right hip pain. Initial encounter. EXAM: DG HIP (WITH OR WITHOUT PELVIS) 2-3V RIGHT COMPARISON:  None. FINDINGS: No evidence of acute fracture or dislocation. Well-preserved joint space. Well-preserved bone mineral density. Ossification at the insertion of the gluteal tendon on the greater tuberosity. Included AP pelvis demonstrates a normal-appearing contralateral left hip. Sacroiliac joints and symphysis pubis intact. Visualized lower lumbar spine intact. IMPRESSION: No acute or significant osseous abnormality. Electronically  Signed   By: Evangeline Dakin M.D.   On: 07/23/2017 14:53    Procedures Procedures (including critical care time)  Medications Ordered in ED Medications  ketorolac (TORADOL) injection 30 mg (30 mg Intramuscular Given 07/23/17 1513)     Initial Impression / Assessment and Plan / ED Course  I have reviewed the triage vital signs and the nursing notes.  Pertinent labs & imaging results that were available during my care of the patient were reviewed by me and considered in my medical decision making (see chart for details).     Presents after mechanical fall with low back pain and right hip pain. She is neurologically intact. Ambulatory to the bathroom. X-ray of the right hip/pelvis and low lumbar spine without acute fractures.I discussed supportive care management including over-the-counter analgesics, muscle relaxants as needed heat packs. Follow-up with  PCP recommended. Strict return and follow-up instructions reviewed. She expressed understanding of all discharge instructions and felt comfortable with the plan of care.   Final Clinical Impressions(s) / ED Diagnoses   Final diagnoses:  Acute midline low back pain without sciatica  Right hip pain  Fall, initial encounter    New Prescriptions New Prescriptions   METHOCARBAMOL (ROBAXIN-750) 750 MG TABLET    Take 1 tablet (750 mg total) by mouth every 6 (six) hours as needed for muscle spasms.     Forde Dandy, MD 07/23/17 [phone]

## 2017-07-23 NOTE — Discharge Instructions (Signed)
The x-rays of the low back and right hip does not show broken bone.  Your pain is likely from bruising.this may take a few weeks to gradually improve.  Continue to take ibuprofen 600 mg every 6 hours as needed for pain (take with food crackers). Use heat packs. Take muscle relaxants as needed.  Return for worsening symptoms, inlcuding escalating pain, new numbness/weakness of the legs, inability to walk or any other symptoms concerning to you.

## 2017-07-23 NOTE — ED Notes (Signed)
Patient transported to X-ray 

## 2017-07-29 MED FILL — ATORVASTATIN 20 MG TABLET: 20 | 30 days supply | Qty: 30 | Fill #2

## 2017-07-29 MED FILL — $TOUJEO SOLOSTAR 300 UNIT/M: 300 | 30 days supply | Qty: 1 | Fill #2

## 2017-07-29 MED FILL — AMLODIPINE BESYLATE 10 MG T: 10 | 30 days supply | Qty: 30 | Fill #2

## 2017-08-13 MED FILL — LISINOPRIL 40 MG TABS: 40 | 30 days supply | Qty: 30 | Fill #2

## 2017-08-13 MED FILL — HYDROCHLOROTHIAZIDE 25 MG T: 25 | 30 days supply | Qty: 30 | Fill #2

## 2017-08-26 ENCOUNTER — Other Ambulatory Visit: Payer: Self-pay | Admitting: Family Medicine

## 2017-08-26 DIAGNOSIS — E782 Mixed hyperlipidemia: Secondary | ICD-10-CM

## 2017-08-26 DIAGNOSIS — I10 Essential (primary) hypertension: Secondary | ICD-10-CM

## 2017-08-26 MED FILL — $TOUJEO SOLOSTAR 300 UNIT/M: 300 | 30 days supply | Qty: 1 | Fill #3

## 2017-09-10 MED FILL — LISINOPRIL 40 MG TAB: 40 | 30 days supply | Qty: 30 | Fill #3

## 2017-09-10 MED FILL — HYDROCHLOROTHIAZIDE 25 MG T: 25 | 30 days supply | Qty: 30 | Fill #3

## 2017-09-20 MED FILL — $TOUJEO SOLOSTAR 300 UNIT/M: 300 | 30 days supply | Qty: 1 | Fill #4

## 2017-10-15 MED FILL — LISINOPRIL 40 MG TAB: 40 | 30 days supply | Qty: 30 | Fill #4

## 2017-10-15 MED FILL — HYDROCHLOROTHIAZIDE 25 MG T: 25 | 30 days supply | Qty: 30 | Fill #4

## 2017-10-29 MED FILL — $TOUJEO SOLOSTAR 300 UNIT/M: 300 | 30 days supply | Qty: 1 | Fill #5

## 2017-11-13 MED FILL — LISINOPRIL 40 MG TAB: 40 | 30 days supply | Qty: 30 | Fill #5

## 2017-11-13 MED FILL — HYDROCHLOROTHIAZIDE 25 MG T: 25 | 30 days supply | Qty: 30 | Fill #5

## 2017-11-26 MED FILL — $TOUJEO SOLOSTAR 300 UNIT/M: 300 | 28 days supply | Qty: 1 | Fill #6

## 2017-12-10 MED FILL — HYDROCHLOROTHIAZIDE 25 MG T: 25 | 30 days supply | Qty: 30 | Fill #6

## 2017-12-10 MED FILL — LISINOPRIL 40 MG TAB: 40 | 30 days supply | Qty: 30 | Fill #6

## 2017-12-24 MED FILL — $TOUJEO SOLOSTAR 300 UNIT/M: 300 | 28 days supply | Qty: 1 | Fill #7

## 2018-01-07 MED FILL — HYDROCHLOROTHIAZIDE 25 MG T: 25 | 30 days supply | Qty: 30 | Fill #7

## 2018-01-07 MED FILL — LISINOPRIL 40 MG TABLET: 40 | 30 days supply | Qty: 30 | Fill #7

## 2018-01-28 MED FILL — $TOUJEO SOLOSTAR 300 UNIT/M: 300 | 28 days supply | Qty: 1 | Fill #8

## 2018-02-10 MED FILL — LISINOPRIL 40 MG TABLET: 40 | 30 days supply | Qty: 30 | Fill #8

## 2018-02-10 MED FILL — HYDROCHLOROTHIAZIDE 25 MG T: 25 | 30 days supply | Qty: 30 | Fill #8

## 2018-02-27 MED FILL — $TOUJEO SOLOSTAR 300 UNIT/M: 300 | 28 days supply | Qty: 1 | Fill #9

## 2018-03-11 MED FILL — LISINOPRIL 40 MG TABLET: 40 | 30 days supply | Qty: 30 | Fill #9

## 2018-03-11 MED FILL — HYDROCHLOROTHIAZIDE 25 MG T: 25 | 30 days supply | Qty: 30 | Fill #9

## 2018-03-27 MED FILL — $TOUJEO SOLOSTAR 300 UNIT/M: 300 | 28 days supply | Qty: 1 | Fill #10

## 2018-04-08 MED FILL — LISINOPRIL 40 MG TABLET: 40 | 30 days supply | Qty: 30 | Fill #10

## 2018-04-08 MED FILL — HYDROCHLOROTHIAZIDE 25 MG T: 25 | 30 days supply | Qty: 30 | Fill #10

## 2018-04-29 MED FILL — $TOUJEO SOLOSTAR 300 UNIT/M: 300 | 28 days supply | Qty: 1 | Fill #11

## 2018-05-13 MED FILL — LISINOPRIL 40 MG TABLET: 40 | 30 days supply | Qty: 30 | Fill #11

## 2018-05-13 MED FILL — HYDROCHLOROTHIAZIDE 25 MG T: 25 | 30 days supply | Qty: 30 | Fill #11

## 2018-05-29 ENCOUNTER — Other Ambulatory Visit: Payer: Self-pay

## 2018-06-05 ENCOUNTER — Other Ambulatory Visit: Payer: Self-pay | Admitting: Family Medicine

## 2018-06-05 MED ORDER — INSULIN GLARGINE 300 UNIT/ML ~~LOC~~ SOPN: 15.0000 [IU] | PEN_INJECTOR | SUBCUTANEOUS | 0 refills | Status: DC

## 2018-06-05 NOTE — Telephone Encounter (Signed)
1) Medication(s) Requested (by name):  Insulin Glargine (TOUJEO SOLOSTAR) 300 UNIT/ML SOPN [[id number]]   2) Pharmacy of Choice: [facility]  3) Special Requests: Patient would like enough until appointment on 06/23/18

## 2018-06-05 NOTE — Addendum Note (Signed)
Addended by: Frederich Cha on: 06/05/2018 02:24 PM   Modules accepted: Orders

## 2018-06-05 NOTE — Telephone Encounter (Signed)
Patient may have a 30 day supply and further refills will be given at her upcoming appointment

## 2018-06-06 MED FILL — $TOUJEO SOLOSTAR 300 UNIT/M: 300 | 28 days supply | Qty: 1 | Fill #0

## 2018-06-23 ENCOUNTER — Ambulatory Visit: Payer: Self-pay | Attending: Family Medicine | Admitting: Family Medicine

## 2018-06-23 ENCOUNTER — Encounter: Payer: Self-pay | Admitting: Family Medicine

## 2018-06-23 ENCOUNTER — Other Ambulatory Visit: Payer: Self-pay

## 2018-06-23 VITALS — BP 173/92 | HR 85 | Temp 98.4°F | Resp 17 | Ht 64.0 in | Wt 121.4 lb

## 2018-06-23 DIAGNOSIS — E119 Type 2 diabetes mellitus without complications: Secondary | ICD-10-CM

## 2018-06-23 DIAGNOSIS — Z1239 Encounter for other screening for malignant neoplasm of breast: Secondary | ICD-10-CM

## 2018-06-23 DIAGNOSIS — R0989 Other specified symptoms and signs involving the circulatory and respiratory systems: Secondary | ICD-10-CM

## 2018-06-23 DIAGNOSIS — F17210 Nicotine dependence, cigarettes, uncomplicated: Secondary | ICD-10-CM | POA: Insufficient documentation

## 2018-06-23 DIAGNOSIS — F17200 Nicotine dependence, unspecified, uncomplicated: Secondary | ICD-10-CM

## 2018-06-23 DIAGNOSIS — Z1231 Encounter for screening mammogram for malignant neoplasm of breast: Secondary | ICD-10-CM

## 2018-06-23 DIAGNOSIS — R7989 Other specified abnormal findings of blood chemistry: Secondary | ICD-10-CM

## 2018-06-23 DIAGNOSIS — Z794 Long term (current) use of insulin: Secondary | ICD-10-CM

## 2018-06-23 DIAGNOSIS — Z79899 Other long term (current) drug therapy: Secondary | ICD-10-CM | POA: Insufficient documentation

## 2018-06-23 DIAGNOSIS — R945 Abnormal results of liver function studies: Secondary | ICD-10-CM

## 2018-06-23 DIAGNOSIS — I10 Essential (primary) hypertension: Secondary | ICD-10-CM

## 2018-06-23 DIAGNOSIS — E782 Mixed hyperlipidemia: Secondary | ICD-10-CM

## 2018-06-23 LAB — POCT GLYCOSYLATED HEMOGLOBIN (HGB A1C): Hemoglobin A1C: 5 % (ref 4.0–5.6)

## 2018-06-23 LAB — GLUCOSE, POCT (MANUAL RESULT ENTRY): POC Glucose: 105 mg/dL — AB (ref 70–99)

## 2018-06-23 MED ORDER — ROSUVASTATIN CALCIUM 10 MG PO TABS: 10.0000 mg | ORAL_TABLET | Freq: Every day | ORAL | 6 refills | Status: DC

## 2018-06-23 MED ORDER — INSULIN PEN NEEDLE 31G X 8 MM MISC: 1.0000 "application " | Freq: Every day | 11 refills | Status: DC

## 2018-06-23 MED ORDER — CARVEDILOL 3.125 MG PO TABS: 3.1250 mg | ORAL_TABLET | Freq: Two times a day (BID) | ORAL | 1 refills | Status: DC

## 2018-06-23 MED ORDER — HYDROCHLOROTHIAZIDE 25 MG PO TABS: 25.0000 mg | ORAL_TABLET | Freq: Every day | ORAL | 1 refills | Status: DC

## 2018-06-23 MED ORDER — LISINOPRIL 40 MG PO TABS: 40.0000 mg | ORAL_TABLET | Freq: Every day | ORAL | 6 refills | Status: DC

## 2018-06-23 MED ORDER — GLUCOSE BLOOD VI STRP: 1.0000 | ORAL_STRIP | Freq: Three times a day (TID) | 11 refills | Status: DC

## 2018-06-23 MED ORDER — INSULIN GLARGINE 300 UNIT/ML ~~LOC~~ SOPN: 15.0000 [IU] | PEN_INJECTOR | SUBCUTANEOUS | 6 refills | Status: DC

## 2018-06-23 MED ORDER — AMLODIPINE BESYLATE 10 MG PO TABS: 10.0000 mg | ORAL_TABLET | Freq: Every day | ORAL | 6 refills | Status: DC

## 2018-06-23 MED ORDER — TRUEPLUS LANCETS 28G MISC: 1.0000 | Freq: Three times a day (TID) | 11 refills | Status: DC

## 2018-06-23 MED FILL — HYDROCHLOROTHIAZIDE 25 MG T: 25 | 30 days supply | Qty: 30 | Fill #0

## 2018-06-23 MED FILL — AMLODIPINE BESYLATE 10 MG T: 10 | 30 days supply | Qty: 30 | Fill #0

## 2018-06-23 MED FILL — ROSUVASTATIN CALCIUM 10 MG: 10 | 30 days supply | Qty: 30 | Fill #0

## 2018-06-23 MED FILL — TRUE METRIX GLUCOSE TEST ST: 30 days supply | Qty: 100 | Fill #0

## 2018-06-23 MED FILL — TRUEplus LANCETS 28G MISC: 30 days supply | Qty: 100 | Fill #0

## 2018-06-23 MED FILL — LISINOPRIL 40 MG TABLET: 40 | 30 days supply | Qty: 30 | Fill #0

## 2018-06-23 MED FILL — CARVEDILOL 3.125 MG TABLET: 3.125 | 30 days supply | Qty: 60 | Fill #0

## 2018-06-23 NOTE — Progress Notes (Signed)
Subjective:    Patient ID: [name], female    DOB: [birth year], 52 y.o.   MRN: [id number]  HPI 52 year old female who returns for follow-up of chronic medical issues including type 2 diabetes, difficult to control hypertension, mixed hyperlipidemia and history of elevated LFTs.  Patient was last seen in the office on 05/29/2017.  Patient reports that her blood sugars have been very well controlled and she denies any issues with hypoglycemia.  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  Patient states that she has been out of her medications for about 3 weeks and that is why her blood pressure is elevated at today's visit.  Patient denies any headaches or dizziness related to her blood pressure.  Patient states that she does continue to smoke about 2 packs/day of cigarettes and drinks about 2 beers per day.  Patient is divorced but is currently in a committed relationship.  Patient reports family history of her maternal grandmother and paternal grandmother both having diabetes.  Patient states that her father had heart disease and had 2 heart attacks and also developed diabetes and had to have one toe amputated after developing a wound.  Patient states that her mother died from dementia but also had emphysema.      Patient states that overall she feels well.  Patient's past surgical history is significant for hysterectomy at age 59 secondary to abnormal bleeding as her periods would last for months.  Patient also reports that she had a colonoscopy when she was about 52 years of age due to a change in bowel habits and she suddenly had issues with constipation.  Patient states that the colonoscopy was normal.  She has not had a subsequent colonoscopy.  Patient denies any blood in the stool and no dark stools.  No changes in bowel habits.  Patient has an occasional cough which is nonproductive but denies any shortness of breath.  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  Patient denies any current urinary frequency or dysuria.  Patient denies any abdominal pain. Past Medical History:  Diagnosis Date  . Anxiety    At age of 14  . Gestational diabetes 73   when pregnant with daughter   . Hypertension 1999   At age of 53  PMH also includes Type 2 DM, history of alcoholic hepatitis, etoh abuse and tobacco dependence Past Surgical History:  Procedure Laterality Date  . ABDOMINAL HYSTERECTOMY     Family History  Problem Relation Age of Onset  . Hypertension Mother   . Diabetes Father   . Hypertension Sister   . Hypertension Brother     Review of Systems  Constitutional: Negative for chills and fever.  HENT: Negative for sore throat and trouble swallowing.   Respiratory: Positive for cough (occasional). Negative for shortness of breath and wheezing.   Cardiovascular: Negative for chest pain and leg swelling.  Gastrointestinal: Negative for abdominal pain and blood in stool.  Endocrine: Negative for polydipsia, polyphagia and polyuria.  Genitourinary: Negative for dysuria and frequency.  Musculoskeletal: Positive for back pain. Negative for gait problem and joint swelling.  Neurological: Negative for dizziness and headaches.       Objective:   Physical Exam BP (!) 173/92   Pulse 85   Temp 98.4 F (36.9 C) (Oral)   Resp 17   Ht 5\' 4"  (1.626 m)   Wt 121 lb 6.4 oz (55.1 kg)  SpO2 98%   BMI 20.84 kg/m Vital signs and nursing notes reviewed.  Patient reports that she has been out of her blood pressure medicine for 3 weeks General- small framed well-nourished, well-developed older female distress ENT- TMs gray, patient with mild edema of the nasal turbinates, patient with mild posterior pharynx erythema Neck-supple, no thyromegaly, patient with positive left carotid bruit Cardiovascular-regular rate and rhythm Lungs- clear to auscultation bilaterally Abdomen-soft, nontender Back- no CVA tenderness, patient with  mild lumbosacral discomfort to palpation Extremities-no edema Foot exam- posterior tibial and dorsalis pedis pulses are poorly palpable.  No active skin breakdown on the feet.      Assessment & Plan:  1. Controlled type 2 diabetes mellitus without complication, with long-term current use of insulin (HCC) Patient's blood sugar is currently very well controlled and I discussed with the patient that her insulin dose can be lowered but she wishes to remain on the same dose at this time. Patient denies any issues with hypoglycemia. Hgb A1c at today's visit was 5.0 and random glucose of 105. Patient will have urine microalbumin, CMP and lipid panel at today's visit. - POCT glucose (manual entry) - POCT glycosylated hemoglobin (Hb A1C) - Microalbumin / creatinine urine ratio - Comprehensive metabolic panel - Lipid panel - rosuvastatin (CRESTOR) 10 MG tablet; Take 1 tablet (10 mg total) by mouth daily. To lower cholesterol  Dispense: 30 tablet; Refill: 6 - Insulin Glargine (TOUJEO SOLOSTAR) 300 UNIT/ML SOPN; Inject 15 Units into the skin every morning.  Dispense: 9 pen; Refill: 6 - Insulin Pen Needle (B-D ULTRAFINE III SHORT PEN) 31G X 8 MM MISC; 1 application by Does not apply route daily.  Dispense: 30 each; Refill: 11 - glucose blood (TRUE METRIX BLOOD GLUCOSE TEST) test strip; 1 each by Other route 3 (three) times daily.  Dispense: 100 each; Refill: 11 - TRUEPLUS LANCETS 28G MISC; 1 each by Does not apply route 3 (three) times daily.  Dispense: 100 each; Refill: 11  2. Bruit of left carotid artery Patient will be scheduled for carotid US and will have a lipid panel but she has had very elevated lipids on lipid panel done 05/29/17 with an LDL of 264; and total cholesterol of 371 - VAS US CAROTID; Future - Lipid panel  3. Uncontrolled hypertension Medications refilled including amlodopine, coreg, HCTZ and lisinopril and importance of compliance with medications stressed - carvedilol (COREG) 3.125  MG tablet; Take 1 tablet (3.125 mg total) by mouth 2 (two) times daily with a meal.  Dispense: 120 tablet; Refill: 1 - hydrochlorothiazide (HYDRODIURIL) 25 MG tablet; Take 1 tablet (25 mg total) by mouth daily.  Dispense: 90 tablet; Refill: 1 - lisinopril (PRINIVIL,ZESTRIL) 40 MG tablet; Take 1 tablet (40 mg total) by mouth daily.  Dispense: 30 tablet; Refill: 6  4. Mixed hyperlipidemia Lipid panel that was done 05/29/17 with total cholesterol of 371 and LDL of 264. Patient with a history of alcoholic hepatitis and elevated LFT's and this may be the reason why she was not on statin medication. Patient with a left carotid bruit on exam and will be started on crestor 10 mg and LFT's will be checked. - Lipid panel - rosuvastatin (CRESTOR) 10 MG tablet; Take 1 tablet (10 mg total) by mouth daily. To lower cholesterol  Dispense: 30 tablet; Refill: 6  5. Elevated LFTs Patient with a history of elevated LFT's and this will be checked as part of CMP - Comprehensive metabolic panel  6. Tobacco dependence Discussed with patient that  tobacco use can increase her risk of COPD/emphysema, CAD, and PAD. Discussed with patient that she should begin efforts to decrease her cigarette use and discussed that she can be scheduled to meet with clinical pharmacist for options to help with smoking once she decides that she would like to set a quit date or to find out more information regarding smoking cessation.  7. Screening for breast cancer Patient will be scheduled for screening mammogram  An After Visit Summary was printed and given to the patient.  Return in about 4 months (around 10/23/2018) for DM; 4 weeks lab and CPP/Luke for tob.

## 2018-06-24 LAB — COMPREHENSIVE METABOLIC PANEL WITH GFR
ALT: 39 IU/L — ABNORMAL HIGH (ref 0–32)
AST: 35 IU/L (ref 0–40)
Albumin/Globulin Ratio: 2.3 — ABNORMAL HIGH (ref 1.2–2.2)
Albumin: 5.4 g/dL (ref 3.5–5.5)
Alkaline Phosphatase: 71 IU/L (ref 39–117)
BUN/Creatinine Ratio: 21 (ref 9–23)
BUN: 20 mg/dL (ref 6–24)
Bilirubin Total: 0.7 mg/dL (ref 0.0–1.2)
CO2: 18 mmol/L — ABNORMAL LOW (ref 20–29)
Calcium: 10 mg/dL (ref 8.7–10.2)
Chloride: 94 mmol/L — ABNORMAL LOW (ref 96–106)
Creatinine, Ser: 0.94 mg/dL (ref 0.57–1.00)
GFR calc Af Amer: 81 mL/min/1.73
GFR calc non Af Amer: 70 mL/min/1.73
Globulin, Total: 2.3 g/dL (ref 1.5–4.5)
Glucose: 86 mg/dL (ref 65–99)
Potassium: 4.6 mmol/L (ref 3.5–5.2)
Sodium: 136 mmol/L (ref 134–144)
Total Protein: 7.7 g/dL (ref 6.0–8.5)

## 2018-06-24 LAB — LIPID PANEL
Chol/HDL Ratio: 5 ratio — ABNORMAL HIGH (ref 0.0–4.4)
Cholesterol, Total: 322 mg/dL — ABNORMAL HIGH (ref 100–199)
HDL: 64 mg/dL
LDL Calculated: 221 mg/dL — ABNORMAL HIGH (ref 0–99)
Triglycerides: 183 mg/dL — ABNORMAL HIGH (ref 0–149)
VLDL Cholesterol Cal: 37 mg/dL (ref 5–40)

## 2018-06-24 LAB — MICROALBUMIN / CREATININE URINE RATIO
Creatinine, Urine: 27.2 mg/dL
Microalb/Creat Ratio: 262.5 mg/g{creat} — ABNORMAL HIGH (ref 0.0–30.0)
Microalbumin, Urine: 71.4 ug/mL

## 2018-06-25 ENCOUNTER — Telehealth: Payer: Self-pay

## 2018-06-25 NOTE — Telephone Encounter (Signed)
Contacted pt to go over lab results pt is aware of results and doesn't have any questions or concerns 

## 2018-07-01 MED FILL — !TOUJEO SOLOSTAR 300 UNITS/: 300/ML | 27 days supply | Qty: 1 | Fill #1

## 2018-07-10 ENCOUNTER — Ambulatory Visit (HOSPITAL_COMMUNITY)
Admission: RE | Admit: 2018-07-10 | Discharge: 2018-07-10 | Disposition: A | Discharge status: Home / Self Care | Payer: Self-pay | Source: Ambulatory Visit | Attending: Family Medicine | Admitting: Family Medicine

## 2018-07-10 DIAGNOSIS — I6522 Occlusion and stenosis of left carotid artery: Secondary | ICD-10-CM | POA: Insufficient documentation

## 2018-07-10 DIAGNOSIS — R0989 Other specified symptoms and signs involving the circulatory and respiratory systems: Secondary | ICD-10-CM

## 2018-07-10 NOTE — Progress Notes (Signed)
Carotid artery duplex has been completed. 1-39% ICA stenosis on the right. 60-79% ICA stenosis on the left.  07/10/18 10:21 AM [name] RVT

## 2018-07-11 ENCOUNTER — Other Ambulatory Visit: Payer: Self-pay | Admitting: Family Medicine

## 2018-07-11 ENCOUNTER — Telehealth: Payer: Self-pay

## 2018-07-11 DIAGNOSIS — I6522 Occlusion and stenosis of left carotid artery: Secondary | ICD-10-CM

## 2018-07-11 NOTE — Telephone Encounter (Signed)
Patient verified DOB Patient is aware of blockage being noted in the carotid. Patient is aware of referral being placed to vein and vascular. Patient was provided that contact information if she does not receive a phone call next week for her initiial appointment. No further questions at this time.

## 2018-07-11 NOTE — Telephone Encounter (Signed)
-----   Message from Antony Blackbird, MD sent at 07/11/2018  1:57 PM EDT ----- Please notify patient that her carotid US showed that she has 1-39% blockage in the right ICA (internal carotid artery) but on the left the blockage is 60-79%. I will be placing a referral for her to meet with a vascular surgeon to discuss the findings and monitor her to see when she might need surgery if the blockage/narrowing worsens (note routed to [place] in case Forbes Cellar is not in clinic today)

## 2018-07-11 NOTE — Progress Notes (Signed)
Patient ID: [name], female   DOB: Nov 04, 1965, 52 y.o.   MRN: [id number]   Patient with recent carotid ultrasound showing 60 to 79% left ICA blockage, patient will be notified of results by CMA as well as need for follow-up with vascular surgeon, referral placed.

## 2018-07-11 NOTE — Telephone Encounter (Signed)
Patient called requesting for her vascular results. Patient was informed they were not in/resulted yet by the provider and she will be informed as soon as they are ready. Please fu at your earliest convenience.

## 2018-07-22 ENCOUNTER — Encounter (HOSPITAL_COMMUNITY): Payer: Self-pay

## 2018-07-22 MED FILL — LISINOPRIL 40 MG TABLET: 40 | 30 days supply | Qty: 30 | Fill #1

## 2018-07-22 MED FILL — AMLODIPINE BESYLATE 10 MG T: 10 | 30 days supply | Qty: 30 | Fill #1

## 2018-07-22 MED FILL — ROSUVASTATIN CALCIUM 10 MG: 10 | 30 days supply | Qty: 30 | Fill #1

## 2018-07-22 MED FILL — CARVEDILOL 3.125 MG TABLET: 3.125 | 30 days supply | Qty: 60 | Fill #1

## 2018-07-22 MED FILL — HYDROCHLOROTHIAZIDE 25 MG T: 25 | 30 days supply | Qty: 30 | Fill #1

## 2018-08-01 MED FILL — !TOUJEO SOLOSTAR 300 UNITS/: 300/ML | 27 days supply | Qty: 1 | Fill #2

## 2018-08-19 MED FILL — HYDROCHLOROTHIAZIDE 25 MG T: 25 | 30 days supply | Qty: 30 | Fill #2

## 2018-08-19 MED FILL — AMLODIPINE BESYLATE 10 MG T: 10 | 30 days supply | Qty: 30 | Fill #2

## 2018-08-19 MED FILL — ROSUVASTATIN CALCIUM 10 MG: 10 | 30 days supply | Qty: 30 | Fill #2

## 2018-08-19 MED FILL — CARVEDILOL 3.125 MG TABLET: 3.125 | 30 days supply | Qty: 60 | Fill #2

## 2018-08-19 MED FILL — $TOUJEO SOLOSTAR 300 UNIT/M: 300 | 81 days supply | Qty: 3 | Fill #3

## 2018-08-19 MED FILL — LISINOPRIL 40 MG TABLET: 40 | 30 days supply | Qty: 30 | Fill #2

## 2018-08-25 ENCOUNTER — Other Ambulatory Visit: Payer: Self-pay

## 2018-08-25 DIAGNOSIS — R0989 Other specified symptoms and signs involving the circulatory and respiratory systems: Secondary | ICD-10-CM

## 2018-09-01 ENCOUNTER — Encounter: Payer: Self-pay | Admitting: Surgery

## 2018-09-01 ENCOUNTER — Ambulatory Visit (HOSPITAL_COMMUNITY)
Admission: RE | Admit: 2018-09-01 | Discharge: 2018-09-01 | Disposition: A | Discharge status: Home / Self Care | Payer: Self-pay | Source: Ambulatory Visit | Attending: Surgery | Admitting: Surgery

## 2018-09-01 ENCOUNTER — Other Ambulatory Visit: Payer: Self-pay

## 2018-09-01 ENCOUNTER — Ambulatory Visit (INDEPENDENT_AMBULATORY_CARE_PROVIDER_SITE_OTHER): Payer: Self-pay | Admitting: Surgery

## 2018-09-01 VITALS — BP 138/77 | HR 76 | Temp 98.3°F | Resp 16 | Ht 62.0 in | Wt 121.0 lb

## 2018-09-01 DIAGNOSIS — R0989 Other specified symptoms and signs involving the circulatory and respiratory systems: Secondary | ICD-10-CM | POA: Insufficient documentation

## 2018-09-01 DIAGNOSIS — I6522 Occlusion and stenosis of left carotid artery: Secondary | ICD-10-CM

## 2018-09-01 NOTE — Progress Notes (Signed)
[facility]  Patient name: [name] MRN: [id number] DOB: [birth year] Sex: female   REQUESTING PROVIDER:    Cammie Fulp   REASON FOR CONSULT:    Carotid stenosis  HISTORY OF PRESENT ILLNESS:   [name] is a 52 y.o. female, who is referred today for evaluation of carotid stenosis.  She was initially found to have a left carotid bruit and was sent for ultrasound which showed CT-7 9% left carotid stenosis.  She denies any symptoms.  Specifically, she denies numbness or weakness in either extremity.  She denies slurred speech.  She denies amaurosis fugax.  The patient has a history of difficult to control hypertension.  She is on an ACE inhibitor..  She takes a statin for hypercholesterolemia.  Patient suffers from diabetes.  Her most recent hemoglobin A1c was 5.0.  Patient continues to smoke, however she has been wearing patches and is now down to a pack a day from 3 packs/day.  She denies any symptoms of claudication.   PAST MEDICAL HISTORY    Past Medical History:  Diagnosis Date  . Anxiety    At age of 64  . Gestational diabetes 9   when pregnant with daughter   . Hypertension 1999   At age of 50     FAMILY HISTORY   Family History  Problem Relation Age of Onset  . Hypertension Mother   . Diabetes Father   . Hypertension Sister   . Hypertension Brother     SOCIAL HISTORY:   Social History   Socioeconomic History  . Marital status: Divorced    Spouse name: Not on file  . Number of children: 1   . Years of education: 67   . Highest education level: Not on file  Occupational History  . Occupation: Secretary/administrator   Social Needs  . Financial resource strain: Not on file  . Food insecurity:    Worry: Not on file    Inability: Not on file  . Transportation needs:    Medical: Not on file    Non-medical: Not on file  Tobacco Use  . Smoking status: Current Every Day Smoker  . Smokeless  tobacco: Never Used  Substance and Sexual Activity  . Alcohol use: Yes    Alcohol/week: 0.0 standard drinks    Comment: 40 oz daily   . Drug use: No  . Sexual activity: Yes    Partners: Male    Birth control/protection: Other-see comments  Lifestyle  . Physical activity:    Days per week: Not on file    Minutes per session: Not on file  . Stress: Not on file  Relationships  . Social connections:    Talks on phone: Not on file    Gets together: Not on file    Attends religious service: Not on file    Active member of club or organization: Not on file    Attends meetings of clubs or organizations: Not on file    Relationship status: Not on file  . Intimate partner violence:    Fear of current or ex partner: Not on file    Emotionally abused: Not on file    Physically abused: Not on file    Forced sexual activity: Not on file  Other Topics Concern  . Not on file  Social History Narrative   Lives with boyfriend of 6 years.    Daughter   Dog     ALLERGIES:    No Known  Allergies  CURRENT MEDICATIONS:    Current Outpatient Medications  Medication Sig Dispense Refill  . amLODipine (NORVASC) 10 MG tablet Take 1 tablet (10 mg total) by mouth at bedtime. 30 tablet 6  . Blood Glucose Monitoring Suppl (TRUE METRIX METER) W/DEVICE KIT 1 each by Does not apply route as needed. 1 kit 0  . carvedilol (COREG) 3.125 MG tablet Take 1 tablet (3.125 mg total) by mouth 2 (two) times daily with a meal. 120 tablet 1  . glucose blood (TRUE METRIX BLOOD GLUCOSE TEST) test strip 1 each by Other route 3 (three) times daily. 100 each 11  . hydrochlorothiazide (HYDRODIURIL) 25 MG tablet Take 1 tablet (25 mg total) by mouth daily. 90 tablet 1  . Insulin Glargine (TOUJEO SOLOSTAR) 300 UNIT/ML SOPN Inject 15 Units into the skin every morning. 9 pen 6  . Insulin Pen Needle (B-D ULTRAFINE III SHORT PEN) 31G X 8 MM MISC 1 application by Does not apply route daily. 30 each 11  . lisinopril  (PRINIVIL,ZESTRIL) 40 MG tablet Take 1 tablet (40 mg total) by mouth daily. 30 tablet 6  . rosuvastatin (CRESTOR) 10 MG tablet Take 1 tablet (10 mg total) by mouth daily. To lower cholesterol 30 tablet 6  . TRUEPLUS LANCETS 28G MISC 1 each by Does not apply route 3 (three) times daily. 100 each 11   No current facility-administered medications for this visit.     REVIEW OF SYSTEMS:   '[X]'  denotes positive finding, '[ ]'  denotes negative finding Cardiac  Comments:  Chest pain or chest pressure:    Shortness of breath upon exertion:    Short of breath when lying flat:    Irregular heart rhythm:        Vascular    Pain in calf, thigh, or hip brought on by ambulation: x   Pain in feet at night that wakes you up from your sleep:     Blood clot in your veins:    Leg swelling:         Pulmonary    Oxygen at home:    Productive cough:     Wheezing:         Neurologic    Sudden weakness in arms or legs:     Sudden numbness in arms or legs:  x   Sudden onset of difficulty speaking or slurred speech:    Temporary loss of vision in one eye:     Problems with dizziness:         Gastrointestinal    Blood in stool:      Vomited blood:         Genitourinary    Burning when urinating:     Blood in urine:        Psychiatric    Major depression:         Hematologic    Bleeding problems:    Problems with blood clotting too easily:        Skin    Rashes or ulcers:        Constitutional    Fever or chills:     PHYSICAL EXAM:   There were no vitals filed for this visit.  GENERAL: The patient is a well-nourished female, in no acute distress. The vital signs are documented above. CARDIAC: There is a regular rate and rhythm.  VASCULAR: Bilateral carotid bruit, left greater than right.  Palpable dorsalis pedis pulse bilaterally PULMONARY: Nonlabored respirations ABDOMEN: Soft and non-tender with normal pitched bowel  sounds.  MUSCULOSKELETAL: There are no major deformities or  cyanosis. NEUROLOGIC: No focal weakness or paresthesias are detected. SKIN: There are no ulcers or rashes noted. PSYCHIATRIC: The patient has a normal affect.  STUDIES:   I have ordered and reviewed her carotid duplex with the following findings: Right carotid: 1-39% stenosis Left carotid: 60-79% stenosis  Outside carotid duplex: Right carotid: 1-39% Left carotid:  60-79%  ASSESSMENT and PLAN   Carotid stenosis: The patient remains asymptomatic.  We stressed the importance of medical management and smoking cessation.  I will continue to follow her with surveillance ultrasound.  Her neck study will be in 6 months.  We discussed that as long as she remains asymptomatic, the indications for repair would be greater than 80%   Annamarie Major, MD [facility] [phone] Pager [phone]

## 2018-09-15 ENCOUNTER — Encounter (HOSPITAL_COMMUNITY): Payer: Self-pay

## 2018-09-15 ENCOUNTER — Encounter: Payer: Self-pay | Admitting: Surgery

## 2018-09-16 MED FILL — CARVEDILOL 3.125 MG TABLET: 3.125 | 30 days supply | Qty: 60 | Fill #3

## 2018-09-16 MED FILL — AMLODIPINE BESYLATE 10 MG T: 10 | 30 days supply | Qty: 30 | Fill #3

## 2018-09-16 MED FILL — ROSUVASTATIN CALCIUM 10 MG: 10 | 30 days supply | Qty: 30 | Fill #3

## 2018-09-16 MED FILL — LISINOPRIL 40 MG TABLET: 40 | 30 days supply | Qty: 30 | Fill #3

## 2018-09-16 MED FILL — HYDROCHLOROTHIAZIDE 25 MG T: 25 | 30 days supply | Qty: 30 | Fill #3

## 2018-10-20 ENCOUNTER — Other Ambulatory Visit: Payer: Self-pay | Admitting: Family Medicine

## 2018-10-20 DIAGNOSIS — I10 Essential (primary) hypertension: Secondary | ICD-10-CM

## 2018-10-20 MED FILL — HYDROCHLOROTHIAZIDE 25 MG T: 25 | 30 days supply | Qty: 30 | Fill #4

## 2018-10-20 MED FILL — LISINOPRIL 40 MG TABLET: 40 | 30 days supply | Qty: 30 | Fill #4

## 2018-10-20 MED FILL — AMLODIPINE BESYLATE 10 MG T: 10 | 30 days supply | Qty: 30 | Fill #4

## 2018-10-20 MED FILL — ROSUVASTATIN CALCIUM 10 MG: 10 | 30 days supply | Qty: 30 | Fill #4

## 2018-10-21 MED FILL — CARVEDILOL 3.125 MG TABLET: 3.125 | 30 days supply | Qty: 60 | Fill #0

## 2018-11-17 ENCOUNTER — Other Ambulatory Visit: Payer: Self-pay | Admitting: Family Medicine

## 2018-11-17 DIAGNOSIS — I10 Essential (primary) hypertension: Secondary | ICD-10-CM

## 2018-11-17 MED FILL — !TOUJEO SOLOSTAR 300 UNITS/: 300/ML | 30 days supply | Qty: 1 | Fill #4

## 2018-11-17 MED FILL — ROSUVASTATIN CALCIUM 10 MG: 10 | 30 days supply | Qty: 30 | Fill #5

## 2018-11-17 MED FILL — AMLODIPINE BESYLATE 10 MG T: 10 | 30 days supply | Qty: 30 | Fill #5

## 2018-11-17 MED FILL — LISINOPRIL 40 MG TABLET: 40 | 30 days supply | Qty: 30 | Fill #5

## 2018-11-17 MED FILL — HYDROCHLOROTHIAZIDE 25 MG T: 25 | 30 days supply | Qty: 30 | Fill #5

## 2018-12-15 ENCOUNTER — Other Ambulatory Visit: Payer: Self-pay | Admitting: Family Medicine

## 2018-12-15 DIAGNOSIS — I10 Essential (primary) hypertension: Secondary | ICD-10-CM

## 2018-12-15 MED FILL — AMLODIPINE BESYLATE 10 MG T: 10 | 30 days supply | Qty: 30 | Fill #6

## 2018-12-15 MED FILL — ROSUVASTATIN CALCIUM 10 MG: 10 | 30 days supply | Qty: 30 | Fill #6

## 2018-12-15 MED FILL — LISINOPRIL 40 MG TABLET: 40 | 30 days supply | Qty: 30 | Fill #6

## 2018-12-15 MED FILL — !TOUJEO SOLOSTAR 300 UNITS/: 300/ML | 30 days supply | Qty: 1 | Fill #5

## 2018-12-18 ENCOUNTER — Other Ambulatory Visit: Payer: Self-pay | Admitting: Family Medicine

## 2018-12-18 DIAGNOSIS — I10 Essential (primary) hypertension: Secondary | ICD-10-CM

## 2019-01-20 MED FILL — TOUJEO SOLOSTAR 300 UNITS/M: 300 | 30 days supply | Qty: 2 | Fill #6

## 2019-01-21 ENCOUNTER — Other Ambulatory Visit: Payer: Self-pay | Admitting: Family Medicine

## 2019-01-21 DIAGNOSIS — I10 Essential (primary) hypertension: Secondary | ICD-10-CM

## 2019-01-21 MED ORDER — AMLODIPINE BESYLATE 10 MG PO TABS: 10.0000 mg | ORAL_TABLET | Freq: Every day | ORAL | 0 refills | Status: DC

## 2019-01-21 MED ORDER — LISINOPRIL 40 MG PO TABS: 40.0000 mg | ORAL_TABLET | Freq: Every day | ORAL | 0 refills | Status: DC

## 2019-01-21 MED FILL — HYDROCHLOROTHIAZIDE 25 MG T: 25 | 30 days supply | Qty: 30 | Fill #0

## 2019-01-21 MED FILL — LISINOPRIL 40 MG TABLET: 40 | 30 days supply | Qty: 30 | Fill #0

## 2019-01-21 MED FILL — AMLODIPINE BESYLATE 10 MG T: 10 | 30 days supply | Qty: 30 | Fill #0

## 2019-01-21 NOTE — Progress Notes (Signed)
Patient ID: [name], female   DOB: 1966/01/26, 53 y.o.   MRN: [id number]   Patient contacted pharmacy for refill of lisinopril and amlodipine. Patient has not been seen in the office since 06/23/18 and did not keep her follow-up appointment. A 30 day supply of each medication will be sent to patient's pharmacy and she will be made aware by pharmacy of the need to schedule a follow-up appoinment

## 2019-02-11 ENCOUNTER — Ambulatory Visit: Payer: Self-pay

## 2019-02-16 ENCOUNTER — Ambulatory Visit: Payer: Self-pay | Attending: Family Medicine | Admitting: Family Medicine

## 2019-02-16 ENCOUNTER — Other Ambulatory Visit: Payer: Self-pay

## 2019-02-16 ENCOUNTER — Encounter: Payer: Self-pay | Admitting: Family Medicine

## 2019-02-16 DIAGNOSIS — Z794 Long term (current) use of insulin: Secondary | ICD-10-CM

## 2019-02-16 DIAGNOSIS — E782 Mixed hyperlipidemia: Secondary | ICD-10-CM

## 2019-02-16 DIAGNOSIS — F17200 Nicotine dependence, unspecified, uncomplicated: Secondary | ICD-10-CM

## 2019-02-16 DIAGNOSIS — G479 Sleep disorder, unspecified: Secondary | ICD-10-CM

## 2019-02-16 DIAGNOSIS — I6522 Occlusion and stenosis of left carotid artery: Secondary | ICD-10-CM

## 2019-02-16 DIAGNOSIS — E119 Type 2 diabetes mellitus without complications: Secondary | ICD-10-CM

## 2019-02-16 DIAGNOSIS — I10 Essential (primary) hypertension: Secondary | ICD-10-CM

## 2019-02-16 DIAGNOSIS — Z79899 Other long term (current) drug therapy: Secondary | ICD-10-CM

## 2019-02-16 MED ORDER — HYDROCHLOROTHIAZIDE 25 MG PO TABS: 25.0000 mg | ORAL_TABLET | Freq: Every day | ORAL | 1 refills | Status: DC

## 2019-02-16 MED ORDER — AMLODIPINE BESYLATE 10 MG PO TABS: 10.0000 mg | ORAL_TABLET | Freq: Every day | ORAL | 1 refills | Status: DC

## 2019-02-16 MED ORDER — NICOTINE 14 MG/24HR TD PT24: 14.0000 mg | MEDICATED_PATCH | Freq: Every day | TRANSDERMAL | 0 refills | Status: DC

## 2019-02-16 MED ORDER — ROSUVASTATIN CALCIUM 10 MG PO TABS: 10.0000 mg | ORAL_TABLET | Freq: Every day | ORAL | 1 refills | Status: DC

## 2019-02-16 MED ORDER — LISINOPRIL 40 MG PO TABS: 40.0000 mg | ORAL_TABLET | Freq: Every day | ORAL | 1 refills | Status: DC

## 2019-02-16 MED ORDER — TRAZODONE HCL 50 MG PO TABS: 25.0000 mg | ORAL_TABLET | Freq: Every evening | ORAL | 3 refills | Status: DC | PRN

## 2019-02-16 MED ORDER — NICOTINE 7 MG/24HR TD PT24: 7.0000 mg | MEDICATED_PATCH | Freq: Every day | TRANSDERMAL | 1 refills | Status: DC

## 2019-02-16 MED ORDER — CARVEDILOL 3.125 MG PO TABS: 3.1250 mg | ORAL_TABLET | Freq: Two times a day (BID) | ORAL | 1 refills | Status: DC

## 2019-02-16 MED ORDER — GLUCOSE BLOOD VI STRP: 1.0000 | ORAL_STRIP | Freq: Three times a day (TID) | 11 refills | Status: DC

## 2019-02-16 MED FILL — traZODone HCL 50 MG TABS: 50 | 30 days supply | Qty: 30 | Fill #0

## 2019-02-16 MED FILL — CARVEDILOL 3.125 MG TABLET: 3.125 | 30 days supply | Qty: 60 | Fill #0

## 2019-02-16 MED FILL — LISINOPRIL 40 MG TABLET: 40 | 30 days supply | Qty: 30 | Fill #0

## 2019-02-16 MED FILL — AMLODIPINE BESYLATE 10 MG T: 10 | 30 days supply | Qty: 30 | Fill #0

## 2019-02-16 MED FILL — HYDROCHLOROTHIAZIDE 25 MG T: 25 | 30 days supply | Qty: 30 | Fill #0

## 2019-02-16 MED FILL — NICOTINE 14 MG/24HR PATCH: 14 | 28 days supply | Qty: 28 | Fill #0

## 2019-02-16 MED FILL — ROSUVASTATIN CALCIUM 10 MG: 10 | 30 days supply | Qty: 30 | Fill #0

## 2019-02-16 MED FILL — TRUE METRIX GLUCOSE TEST ST: 30 days supply | Qty: 100 | Fill #0

## 2019-02-16 NOTE — Progress Notes (Signed)
Med refills  CBG this morning at home was 120 and yesterday was 118  Temp 97.5 was completed at home today.

## 2019-02-16 NOTE — Progress Notes (Signed)
Virtual Visit via Telephone Note  I connected with [name] on 02/16/19 at  2:30 PM EDT by telephone and verified that I am speaking with the correct person using two identifiers.   I discussed the limitations, risks, security and privacy concerns of performing an evaluation and management service by telephone and the availability of in person appointments. I also discussed with the patient that there may be a patient responsible charge related to this service. The patient expressed understanding and agreed to proceed.  Patient Location: Home Provider Location: Office Others participating in call: call initiated by [name], RMA   History of Present Illness:        53 year old female with history of difficult to control hypertension, well-controlled diabetes and hypercholesterolemia as well as carotid stenosis.  She has seen vascular surgeon on 12-20 19, Dr. Trula Slade.  Patient with 76 to 79% left carotid stenosis.  She will need repeat carotid follow-up and is scheduled per patient for March 03, 2019 follow-up.       Patient reports that she is taking all of her current medications for blood pressure.  She denies any headaches or dizziness related to her blood pressure.  She has had no episodes of focal numbness or weakness.  No slurred speech.  She reports that she is using her insulin for treatment of her diabetes and she feels that her blood sugars are controlled.  She denies any significant hypoglycemic episodes.  Her fasting blood sugars are generally in the 130s to 120s or less.  She denies any urinary frequency, no blurred vision and no increased thirst.  She is also taking her cholesterol medication daily.  Patient continues to smoke but states that she is now ready to take steps to quit smoking.  She would like to use nicotine patches.  She denies any current cough or shortness of breath.  She denies any issues with chest pain or palpitations.  No peripheral edema.   Past  Medical History:  Diagnosis Date  . Anxiety    At age of 62  . Gestational diabetes 61   when pregnant with daughter   . Hypertension 1999   At age of 66    Past Surgical History:  Procedure Laterality Date  . ABDOMINAL HYSTERECTOMY      Family History  Problem Relation Age of Onset  . Hypertension Mother   . Diabetes Father   . Hypertension Sister   . Hypertension Brother     Social History   Tobacco Use  . Smoking status: Current Every Day Smoker    Packs/day: 1.00    Types: Cigarettes  . Smokeless tobacco: Never Used  Substance Use Topics  . Alcohol use: Yes    Alcohol/week: 0.0 standard drinks    Comment: 40 oz daily   . Drug use: No     No Known Allergies   Review of Systems  Constitutional: Negative for chills and fever.  HENT: Negative for congestion and sore throat.   Eyes: Negative for blurred vision and double vision.  Respiratory: Negative for cough and shortness of breath.   Cardiovascular: Negative for chest pain and palpitations.  Gastrointestinal: Negative for heartburn.  Genitourinary: Negative for dysuria and frequency.  Musculoskeletal: Negative for joint pain and myalgias.  Skin: Negative for itching and rash.  Neurological: Negative for dizziness and headaches.  Endo/Heme/Allergies: Negative for polydipsia. Does not bruise/bleed easily.  Psychiatric/Behavioral: Negative for suicidal ideas. The patient has insomnia.      Observations/Objective: No  vital signs or physical exam conducted as visit was done via telephone due to limitations/restrictions on in office visits due to COVID-19 pandemic  Assessment and Plan: 1. Uncontrolled hypertension Patient is to continue her current medications including amlodipine, carvedilol, hydrochlorothiazide and lisinopril.  DASH diet also encouraged as well as low impact cardiovascular exercise.  Patient is to return to clinic for fasting blood work including lipid panel and CMP - amLODipine (NORVASC) 10  MG tablet; Take 1 tablet (10 mg total) by mouth at bedtime. To lower blood pressure  Dispense: 90 tablet; Refill: 1 - carvedilol (COREG) 3.125 MG tablet; Take 1 tablet (3.125 mg total) by mouth 2 (two) times daily with a meal. To lower blood pressure  Dispense: 180 tablet; Refill: 1 - hydrochlorothiazide (HYDRODIURIL) 25 MG tablet; Take 1 tablet (25 mg total) by mouth daily. To lower blood pressure  Dispense: 90 tablet; Refill: 1 - lisinopril (ZESTRIL) 40 MG tablet; Take 1 tablet (40 mg total) by mouth daily. To lower blood pressure  Dispense: 90 tablet; Refill: 1 - Comprehensive metabolic panel; Future - Lipid Panel; Future  2. Controlled type 2 diabetes mellitus without complication, with long-term current use of insulin ([place]) Patient with type 2 diabetes which is been well controlled.  Patient's last hemoglobin A1c in September 2019 was 5.0.  Continue current medications including Toujeo and continue monitoring of blood sugars as well as low carbohydrate diet and regular exercise. - rosuvastatin (CRESTOR) 10 MG tablet; Take 1 tablet (10 mg total) by mouth daily. To lower cholesterol  Dispense: 90 tablet; Refill: 1 - glucose blood (TRUE METRIX BLOOD GLUCOSE TEST) test strip; 1 each by Other route 3 (three) times daily.  Dispense: 100 each; Refill: 11 - Lipid Panel; Future - Hemoglobin A1c; Future  3. Mixed hyperlipidemia Continue a low-fat diet as well as use of Crestor 10 mg.  Continue efforts at exercise and weight loss.  Once the COVID pandemic has lessened, please return for lab visit for fasting lipid panel and complete metabolic panel - rosuvastatin (CRESTOR) 10 MG tablet; Take 1 tablet (10 mg total) by mouth daily. To lower cholesterol  Dispense: 90 tablet; Refill: 1 - Comprehensive metabolic panel; Future - Lipid Panel; Future  4. Carotid stenosis, left Patient with left carotid stenosis and she encouraged to continue low-fat diet as well as continued control of blood pressure, smoking  cessation and diabetes to help with stroke prevention.  Patient is scheduled for repeat carotid ultrasound in June.  Return to clinic for fasting lipid panel and CMP.   - Comprehensive metabolic panel; Future - Lipid Panel; Future  5. Tobacco dependence Patient was tobacco dependence and she states that she is ready to stop smoking.  Patient is being prescribed NicoDerm patches to start with the 14 mg/ 24-hour patch x28 days / 4 weeks and then she will go to the lower dose of 7 mg per 24 hours for 2 weeks. - nicotine (NICODERM CQ) 14 mg/24hr patch; Place 1 patch (14 mg total) onto the skin daily. Then start 7 mg patch once per day  Dispense: 28 patch; Refill: 0 - nicotine (NICODERM CQ) 7 mg/24hr patch; Place 1 patch (7 mg total) onto the skin daily. After completing 14 mg dose patch for 4 weeks  Dispense: 28 patch; Refill: 1  6. Sleeping difficulty Prescription for trazodone 50 mg to take one half or 1 pill at bedtime as needed for sleep.  Sleep hygiene also reviewed - traZODone (DESYREL) 50 MG tablet; Take 0.5-1 tablets (  25-50 mg total) by mouth at bedtime as needed for sleep.  Dispense: 30 tablet; Refill: 3  7. Long-term use of high-risk medication CMP will be done in follow-up of high-risk medication use including blood pressure medications and statin medications when patient returns for lab visit - Comprehensive metabolic panel; Future  Follow Up Instructions:Return in about 5 months (around 07/19/2019) for Chronic issues; 4 weeks for fasting labs.   I discussed the assessment and treatment plan with the patient. The patient was provided an opportunity to ask questions and all were answered. The patient agreed with the plan and demonstrated an understanding of the instructions.   The patient was advised to call back or seek an in-person evaluation if the symptoms worsen or if the condition fails to improve as anticipated.  I provided 12 minutes of non-face-to-face time during this  encounter.   Antony Blackbird, MD

## 2019-02-20 MED FILL — !TOUJEO SOLOSTAR 300 UNITS/: 300/ML | 30 days supply | Qty: 1 | Fill #0

## 2019-02-23 ENCOUNTER — Encounter: Payer: Self-pay | Admitting: Family Medicine

## 2019-02-26 ENCOUNTER — Other Ambulatory Visit: Payer: Self-pay

## 2019-02-26 DIAGNOSIS — I6522 Occlusion and stenosis of left carotid artery: Secondary | ICD-10-CM

## 2019-02-28 ENCOUNTER — Emergency Department (HOSPITAL_BASED_OUTPATIENT_CLINIC_OR_DEPARTMENT_OTHER)
Admission: EM | Admit: 2019-02-28 | Discharge: 2019-02-28 | Disposition: A | Discharge status: Home / Self Care | Payer: Self-pay | Attending: Emergency Medicine | Admitting: Emergency Medicine

## 2019-02-28 ENCOUNTER — Encounter (HOSPITAL_BASED_OUTPATIENT_CLINIC_OR_DEPARTMENT_OTHER): Payer: Self-pay | Admitting: Student

## 2019-02-28 ENCOUNTER — Other Ambulatory Visit: Payer: Self-pay

## 2019-02-28 DIAGNOSIS — Z79899 Other long term (current) drug therapy: Secondary | ICD-10-CM | POA: Insufficient documentation

## 2019-02-28 DIAGNOSIS — K0889 Other specified disorders of teeth and supporting structures: Secondary | ICD-10-CM | POA: Insufficient documentation

## 2019-02-28 DIAGNOSIS — E119 Type 2 diabetes mellitus without complications: Secondary | ICD-10-CM | POA: Insufficient documentation

## 2019-02-28 DIAGNOSIS — I10 Essential (primary) hypertension: Secondary | ICD-10-CM | POA: Insufficient documentation

## 2019-02-28 DIAGNOSIS — Z7984 Long term (current) use of oral hypoglycemic drugs: Secondary | ICD-10-CM | POA: Insufficient documentation

## 2019-02-28 DIAGNOSIS — F17210 Nicotine dependence, cigarettes, uncomplicated: Secondary | ICD-10-CM | POA: Insufficient documentation

## 2019-02-28 MED ORDER — PENICILLIN V POTASSIUM 500 MG PO TABS: 500.0000 mg | ORAL_TABLET | Freq: Four times a day (QID) | ORAL | 0 refills | Status: AC

## 2019-02-28 MED ORDER — NAPROXEN 500 MG PO TABS: 500.0000 mg | ORAL_TABLET | Freq: Two times a day (BID) | ORAL | 0 refills | Status: DC

## 2019-02-28 NOTE — ED Provider Notes (Signed)
[facility] Provider Note   CSN: [id number] Arrival date & time: 02/28/19  2117    History   Chief Complaint Chief Complaint  Patient presents with  . Dental Pain    HPI [name] is a 53 y.o. female with a history of tobacco abuse, T2DM,  hypertension, EtOH abuse, and anxiety who presents to the ED with complaints of acute on chronic dental pain.  Patient states he has chronic issues with her teeth, she states that over the past 2 to 3 days she has had increased pain to the left lower second molar primarily is also increased pain to the left upper molar area.  Pain is constant, severe, no alleviating or aggravating factors.  Tried drinking alcohol and taking ibuprofen without much relief.  Denies fever, chills, vomiting, trouble swallowing, trouble breathing, swelling beneath the tongue, drooling, or change in voice.  Patient did not drive to the emergency department, she has a ride home.  She does not have a dentist.     HPI  Past Medical History:  Diagnosis Date  . Anxiety    At age of 74  . Gestational diabetes 64   when pregnant with daughter   . Hypertension 1999   At age of 59    Patient Active Problem List   Diagnosis Date Noted  . Mixed hyperlipidemia 06/04/2017  . Alcoholic hepatitis without ascites 02/23/2015  . HTN (hypertension), malignant 02/21/2015  . Current smoker 02/21/2015  . Diabetes type 2, uncontrolled ([place]) 02/21/2015  . Poor dentition 02/21/2015  . ETOH abuse 02/21/2015    Past Surgical History:  Procedure Laterality Date  . ABDOMINAL HYSTERECTOMY       OB History   No obstetric history on file.      Home Medications    Prior to Admission medications   Medication Sig Start Date End Date Taking? Authorizing Provider  amLODipine (NORVASC) 10 MG tablet Take 1 tablet (10 mg total) by mouth at bedtime. To lower blood pressure 02/16/19   Fulp, Cammie, MD  Blood Glucose Monitoring Suppl (TRUE METRIX METER)  W/DEVICE KIT 1 each by Does not apply route as needed. 02/21/15   Boykin Nearing, MD  carvedilol (COREG) 3.125 MG tablet Take 1 tablet (3.125 mg total) by mouth 2 (two) times daily with a meal. To lower blood pressure 02/16/19   Fulp, Cammie, MD  glucose blood (TRUE METRIX BLOOD GLUCOSE TEST) test strip 1 each by Other route 3 (three) times daily. 02/16/19   Fulp, Cammie, MD  hydrochlorothiazide (HYDRODIURIL) 25 MG tablet Take 1 tablet (25 mg total) by mouth daily. To lower blood pressure 02/16/19   Fulp, Cammie, MD  Insulin Glargine (TOUJEO SOLOSTAR) 300 UNIT/ML SOPN Inject 15 Units into the skin every morning. 06/23/18   Fulp, Cammie, MD  Insulin Pen Needle (B-D ULTRAFINE III SHORT PEN) 31G X 8 MM MISC 1 application by Does not apply route daily. 06/23/18   Fulp, Cammie, MD  lisinopril (ZESTRIL) 40 MG tablet Take 1 tablet (40 mg total) by mouth daily. To lower blood pressure 02/16/19   Fulp, Cammie, MD  MELATONIN PO Take by mouth at bedtime.    [provider]  nicotine (NICODERM CQ) 14 mg/24hr patch Place 1 patch (14 mg total) onto the skin daily. Then start 7 mg patch once per day 02/16/19   Fulp, Cammie, MD  nicotine (NICODERM CQ) 7 mg/24hr patch Place 1 patch (7 mg total) onto the skin daily. After completing 14 mg dose  patch for 4 weeks 02/16/19   Fulp, Cammie, MD  rosuvastatin (CRESTOR) 10 MG tablet Take 1 tablet (10 mg total) by mouth daily. To lower cholesterol 02/16/19   Fulp, Cammie, MD  traZODone (DESYREL) 50 MG tablet Take 0.5-1 tablets (25-50 mg total) by mouth at bedtime as needed for sleep. 02/16/19   Fulp, Cammie, MD  TRUEPLUS LANCETS 28G MISC 1 each by Does not apply route 3 (three) times daily. 06/23/18   Antony Blackbird, MD    Family History Family History  Problem Relation Age of Onset  . Hypertension Mother   . Diabetes Father   . Hypertension Sister   . Hypertension Brother     Social History Social History   Tobacco Use  . Smoking status: Current Every Day Smoker     Packs/day: 1.00    Types: Cigarettes  . Smokeless tobacco: Never Used  Substance Use Topics  . Alcohol use: Yes    Alcohol/week: 0.0 standard drinks    Comment: 40 oz daily   . Drug use: No     Allergies   Patient has no known allergies.   Review of Systems Review of Systems  Constitutional: Negative for chills and fever.  HENT: Positive for dental problem. Negative for drooling, sore throat, trouble swallowing and voice change.   Respiratory: Negative for shortness of breath.   Cardiovascular: Negative for chest pain.    Physical Exam Updated Vital Signs BP (!) 150/82 (BP Location: Left Arm)   Pulse 72   Temp 97.6 F (36.4 C) (Oral)   Resp 12   Ht '5\' 2"'  (1.575 m)   Wt 57.2 kg   SpO2 100%   BMI 23.05 kg/m   Physical Exam Vitals signs and nursing note reviewed.  Constitutional:      General: She is not in acute distress.    Appearance: She is well-developed. She is not toxic-appearing.  HENT:     Head: Normocephalic and atraumatic.     Right Ear: Tympanic membrane is not perforated, erythematous, retracted or bulging.     Left Ear: Tympanic membrane is not perforated, erythematous, retracted or bulging.     Nose: Nose normal.     Mouth/Throat:     Pharynx: Uvula midline. No oropharyngeal exudate, posterior oropharyngeal erythema or uvula swelling.     Tonsils: No tonsillar abscesses.     Comments: Patient has extremely poor dentition throughout with multiple teeth being absent, too numerous to count dental caries.  She has some mild erythema and swelling noted to the gingiva where the left upper second and third molars previously were.  She also has some mild erythema and gingival swelling noted to the area around the left lower second molar.  No palpable fluctuance.  No gross abscess. Posterior oropharynx is symmetric appearing. Patient tolerating own secretions without difficulty. No trismus. No drooling. No hot potato voice. No swelling beneath the tongue,  submandibular compartment is soft.    Eyes:     General:        Right eye: No discharge.        Left eye: No discharge.     Conjunctiva/sclera: Conjunctivae normal.  Neck:     Musculoskeletal: Normal range of motion and neck supple. No neck rigidity.  Cardiovascular:     Rate and Rhythm: Normal rate.  Pulmonary:     Effort: Pulmonary effort is normal.  Lymphadenopathy:     Cervical: No cervical adenopathy.  Neurological:     Mental Status: She is alert.  Psychiatric:        Behavior: Behavior normal.        Thought Content: Thought content normal.    ED Treatments / Results  Labs (all labs ordered are listed, but only abnormal results are displayed) Labs Reviewed - No data to display  EKG None  Radiology No results found.  Procedures Procedures (including critical care time)  Medications Ordered in ED Medications - No data to display   Initial Impression / Assessment and Plan / ED Course  I have reviewed the triage vital signs and the nursing notes.  Pertinent labs & imaging results that were available during my care of the patient were reviewed by me and considered in my medical decision making (see chart for details).    Patient presents with dental pain. Patient is nontoxic appearing, vitals without significant abnormality, BP elevated- doubt HTN emergency, PCP recheck. No gross abscess.  Exam unconcerning for Ludwig's angina or deep space infection.  Will treat with Pen VK and short course of Naproxen.  Urged patient to follow-up with dentist, dental resources were provided.  Discussed treatment plan and need for follow up as well as return precautions. Provided opportunity for questions, patient confirmed understanding and is agreeable to plan.   Final Clinical Impressions(s) / ED Diagnoses   Final diagnoses:  Pain, dental    ED Discharge Orders         Ordered    penicillin v potassium (VEETID) 500 MG tablet  4 times daily     02/28/19 2157    naproxen  (NAPROSYN) 500 MG tablet  2 times daily     02/28/19 2157           Leafy Kindle 02/28/19 2158    Drenda Freeze, MD 02/28/19 2231

## 2019-02-28 NOTE — ED Triage Notes (Signed)
Pt states that she has tried [employer]. Had 4 beers and 2 shots and it is still hurting

## 2019-02-28 NOTE — Discharge Instructions (Signed)
Call one of the dentists offices provided to schedule an appointment for re-evaluation and further management within the next 48 hours.   I have prescribed you Penicillin which is an antibiotic to treat the infection and Naproxen which is an anti-inflammatory medicine to treat the pain.   Please take all of your antibiotics until finished. You may develop abdominal discomfort or diarrhea from the antibiotic.  You may help offset this with probiotics which you can buy at the store (ask your pharmacist if unable to find) or get probiotics in the form of eating yogurt. Do not eat or take the probiotics until 2 hours after your antibiotic. If you are unable to tolerate these side effects follow-up with your primary care provider or return to the emergency department.   If you begin to experience any blistering, rashes, swelling, or difficulty breathing seek medical care for evaluation of potentially more serious side effects.   Be sure to eat something when taking the Naproxen as it can cause stomach upset and at worst stomach bleeding. Do not take additional non steroidal anti-inflammatory medicines such as Ibuprofen, Aleve, Advil, Mobic, Diclofenac, or goodie powder while taking Naproxen. You may supplement with Tylenol.   We have prescribed you new medication(s) today. Discuss the medications prescribed today with your pharmacist as they can have adverse effects and interactions with your other medicines including over the counter and prescribed medications. Seek medical evaluation if you start to experience new or abnormal symptoms after taking one of these medicines, seek care immediately if you start to experience difficulty breathing, feeling of your throat closing, facial swelling, or rash as these could be indications of a more serious allergic reaction  If you start to experience and new or worsening symptoms return to the emergency department. If you start to experience fever, chills, neck  stiffness/pain, or inability to move your neck or open your mouth come back to the emergency department immediately.

## 2019-02-28 NOTE — ED Triage Notes (Signed)
Pt states she has a toothache on the left upper and left lower side. States that the top one has been going on 1.5 years and the lower one just started. Has not seen a dentist. States that she took 600mg  this am and around 1800. Denies fever, n/v

## 2019-03-03 ENCOUNTER — Ambulatory Visit: Payer: Self-pay | Admitting: Family

## 2019-03-03 ENCOUNTER — Encounter (HOSPITAL_COMMUNITY): Payer: Self-pay

## 2019-03-16 ENCOUNTER — Ambulatory Visit: Payer: Self-pay | Admitting: Family

## 2019-03-16 ENCOUNTER — Ambulatory Visit (HOSPITAL_COMMUNITY)
Admission: RE | Admit: 2019-03-16 | Discharge: 2019-03-16 | Disposition: A | Discharge status: Home / Self Care | Payer: Self-pay | Source: Ambulatory Visit | Attending: Family | Admitting: Family

## 2019-03-16 ENCOUNTER — Other Ambulatory Visit: Payer: Self-pay

## 2019-03-16 DIAGNOSIS — I6522 Occlusion and stenosis of left carotid artery: Secondary | ICD-10-CM | POA: Insufficient documentation

## 2019-03-16 MED FILL — LISINOPRIL 40 MG TABLET: 40 | 30 days supply | Qty: 30 | Fill #1

## 2019-03-16 MED FILL — ?AMLODIPINE BESYLATE 10 MG: 10 | 30 days supply | Qty: 30 | Fill #1

## 2019-03-16 MED FILL — ?ROSUVASTATIN CALCIUM 10 MG: 10 | 30 days supply | Qty: 30 | Fill #1

## 2019-03-16 MED FILL — ?HYDROCHLOROTHIAZIDE 25MG T: 25 | 30 days supply | Qty: 30 | Fill #1

## 2019-03-16 MED FILL — traZODone HCL 50 MG TABS: 50 | 30 days supply | Qty: 30 | Fill #1

## 2019-03-16 MED FILL — !TOUJEO SOLOSTAR 300 UNITS/: 300/ML | 30 days supply | Qty: 2 | Fill #1

## 2019-03-16 MED FILL — ?CARVEDILOL 3.125 MG TABLET: 3.125 | 30 days supply | Qty: 60 | Fill #1

## 2019-03-17 ENCOUNTER — Telehealth: Payer: Self-pay | Admitting: *Deleted

## 2019-03-18 ENCOUNTER — Other Ambulatory Visit: Payer: Self-pay | Admitting: Oral Surgery

## 2019-03-19 ENCOUNTER — Other Ambulatory Visit: Payer: Self-pay

## 2019-03-19 ENCOUNTER — Encounter: Payer: Self-pay | Admitting: Family

## 2019-03-19 ENCOUNTER — Ambulatory Visit (INDEPENDENT_AMBULATORY_CARE_PROVIDER_SITE_OTHER): Payer: Self-pay | Admitting: Family

## 2019-03-19 VITALS — BP 120/80 | Temp 97.4°F | Resp 14 | Ht 62.0 in | Wt 127.0 lb

## 2019-03-19 DIAGNOSIS — F17200 Nicotine dependence, unspecified, uncomplicated: Secondary | ICD-10-CM

## 2019-03-19 DIAGNOSIS — I6523 Occlusion and stenosis of bilateral carotid arteries: Secondary | ICD-10-CM

## 2019-03-19 NOTE — Progress Notes (Signed)
Virtual Visit via Telephone Note   I connected with [name] on 03/19/2019 using the [employer] by telephone and verified that I was speaking with the correct person using two identifiers. Patient was located at her home and accompanied by her fiance. I am located at the [facility] office on [facility].   The limitations of evaluation and management by telemedicine and the availability of in person appointments have been previously discussed with the patient and are documented in the patients chart. The patient expressed understanding and consented to proceed.  PCP: Antony Blackbird, MD   Chief Complaint: Follow up Extracranial Carotid Artery Stenosis   History of Present Illness: [name] is a 53 y.o. female whom Dr. Trula Slade saw on initial evaluation on 09-01-18 for left carotid bruit and was sent for ultrasound which showed 60-79% left carotid stenosis.   She denies any symptoms.  Specifically, she denies numbness or weakness in either extremity.  She denies slurred speech.  She denies amaurosis fugax.  She continues to smoke, however she has been wearing patches and is now down to 1.5 pack a day from 3 packs/day.  She denies any symptoms of claudication.  She states she will have all of her teeth extracted soon due to cracked teeth, bone and gum recession, and pain in her gums.   She states she has carpal tunnel syndrome in her right hand.  Diabetic: yes, 5.0 A1C on 06-23-18 Tobacco use: smoker  (1.5 ppd, started at age [age] yrs)  Pt meds include: Statin : yes ASA: yes, 81 mg daily Other anticoagulants/antiplatelets: no    Past Medical History:  Diagnosis Date  . Anxiety    At age of 72  . Gestational diabetes 89   when pregnant with daughter   . Hypertension 1999   At age of 57    Past Surgical History:  Procedure Laterality Date  . ABDOMINAL HYSTERECTOMY      Current Meds  Medication Sig  . amLODipine (NORVASC) 10 MG tablet Take 1 tablet (10 mg total) by mouth at  bedtime. To lower blood pressure  . Blood Glucose Monitoring Suppl (TRUE METRIX METER) W/DEVICE KIT 1 each by Does not apply route as needed.  . carvedilol (COREG) 3.125 MG tablet Take 1 tablet (3.125 mg total) by mouth 2 (two) times daily with a meal. To lower blood pressure  . glucose blood (TRUE METRIX BLOOD GLUCOSE TEST) test strip 1 each by Other route 3 (three) times daily.  . hydrochlorothiazide (HYDRODIURIL) 25 MG tablet Take 1 tablet (25 mg total) by mouth daily. To lower blood pressure  . Insulin Glargine (TOUJEO SOLOSTAR) 300 UNIT/ML SOPN Inject 15 Units into the skin every morning.  . Insulin Pen Needle (B-D ULTRAFINE III SHORT PEN) 31G X 8 MM MISC 1 application by Does not apply route daily.  Marland Kitchen lisinopril (ZESTRIL) 40 MG tablet Take 1 tablet (40 mg total) by mouth daily. To lower blood pressure  . MELATONIN PO Take by mouth at bedtime.  . nicotine (NICODERM CQ) 14 mg/24hr patch Place 1 patch (14 mg total) onto the skin daily. Then start 7 mg patch once per day  . rosuvastatin (CRESTOR) 10 MG tablet Take 1 tablet (10 mg total) by mouth daily. To lower cholesterol  . traZODone (DESYREL) 50 MG tablet Take 0.5-1 tablets (25-50 mg total) by mouth at bedtime as needed for sleep.  . TRUEPLUS LANCETS 28G MISC 1 each by Does not apply route 3 (three) times daily.  12 system ROS was negative unless otherwise noted in HPI   Observations/Objective:  Carotid Duplex (03-16-19): Right Carotid: Velocities in the right ICA are consistent with a 1-39% stenosis.                Non-hemodynamically significant plaque <50% noted in the CCA. The                ECA appears >50% stenosed. Left Carotid: Velocities in the left ICA are consistent with a 60-79% stenosis.               Non-hemodynamically significant plaque noted in the CCA. The ECA               appears >50% stenosed. Vertebrals:  Left vertebral artery demonstrates antegrade flow. Right vertebral              artery demonstrates  retrograde flow. 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 No significant change compared to the exams on 07-10-18 and 09-01-18.   Vitals:   03/19/19 1313  BP: 120/80  Resp: 14  Temp: (!) 97.4 F (36.3 C)  TempSrc: Oral  Weight: 127 lb (57.6 kg)  Height: '5\' 2"'  (1.575 m)   Body mass index is 23.23 kg/m.    Assessment and Plan: Carotid duplex today remains stable compared to previous exams, 1-39% right ICA stenosis, 60-79% left ICA stenosis. She has no hx of stroke or TIA.  Her atherosclerotic risk factors include very well controlled DM and 37 year hx of smoking, currently decreased to 1.5 ppd , was at 3 ppd.  She takes a daily statin and 81 mg ASA.   Follow Up Instructions:   Follow up 6 months with carotid duplex in 6 months.  Continue daily 81 mg ASA, may stop temporarily if needed for her teeth extraction.   I discussed the assessment and treatment plan with the patient. The patient was provided an opportunity to ask questions and all were answered. The patient agreed with the plan and demonstrated an understanding of the instructions.   The patient was advised to call back or seek an in-person evaluation if the symptoms worsen or if the condition fails to improve as anticipated.  I spent 12 minutes with the patient via telephone encounter.   Gabrielle Dare Ronalee Scheunemann [facility] Office: [phone]  1500, 03-19-19

## 2019-04-17 MED FILL — traZODone HCL 50 MG TABS: 50 | 30 days supply | Qty: 30 | Fill #2

## 2019-04-17 MED FILL — LISINOPRIL 40 MG TABLET: 40 | 30 days supply | Qty: 30 | Fill #2

## 2019-04-17 MED FILL — ?ROSUVASTATIN CALCIUM 10 MG: 10 | 30 days supply | Qty: 30 | Fill #2

## 2019-04-17 MED FILL — ?AMLODIPINE BESYLATE 10 MG: 10 | 30 days supply | Qty: 30 | Fill #2

## 2019-04-17 MED FILL — ?CARVEDILOL 3.125 MG TABLET: 3.125 | 30 days supply | Qty: 60 | Fill #2

## 2019-04-17 MED FILL — ?HYDROCHLOROTHIAZIDE 25MG T: 25 | 30 days supply | Qty: 30 | Fill #2

## 2019-04-17 MED FILL — TOUJEO SOLOSTAR 300 UNITS/M: 300 | 30 days supply | Qty: 2 | Fill #0

## 2019-05-18 MED FILL — ?ROSUVASTATIN CALCIUM 10 MG: 10 | 30 days supply | Qty: 30 | Fill #3

## 2019-05-18 MED FILL — LISINOPRIL 40 MG TABLET: 40 | 30 days supply | Qty: 30 | Fill #3

## 2019-05-18 MED FILL — ?CARVEDILOL 3.125 MG TABLET: 3.125 | 30 days supply | Qty: 60 | Fill #3

## 2019-05-18 MED FILL — ?AMLODIPINE BESYLATE 10 MG: 10 | 30 days supply | Qty: 30 | Fill #3

## 2019-05-18 MED FILL — TOUJEO SOLOSTAR 300 UNITS/M: 300 | 30 days supply | Qty: 2 | Fill #1

## 2019-05-18 MED FILL — ?HYDROCHLOROTHIAZIDE 25MG T: 25 | 30 days supply | Qty: 30 | Fill #3

## 2019-05-18 MED FILL — traZODone HCL 50 MG TABS: 50 | 30 days supply | Qty: 30 | Fill #3

## 2019-06-22 ENCOUNTER — Other Ambulatory Visit: Payer: Self-pay | Admitting: Family Medicine

## 2019-06-22 DIAGNOSIS — G479 Sleep disorder, unspecified: Secondary | ICD-10-CM

## 2019-06-22 MED FILL — ?AMLODIPINE BESYLATE 10 MG: 10 | 30 days supply | Qty: 30 | Fill #4

## 2019-06-22 MED FILL — ?ROSUVASTATIN CALCIUM 10 MG: 10 | 30 days supply | Qty: 30 | Fill #4

## 2019-06-22 MED FILL — ?HYDROCHLOROTHIAZIDE 25MG T: 25 | 30 days supply | Qty: 30 | Fill #4

## 2019-06-22 MED FILL — ?traZODONE HCL 50MG TAB: 50 | 30 days supply | Qty: 30 | Fill #0

## 2019-06-22 MED FILL — TOUJEO SOLOSTAR 300 UNITS/M: 300 | 30 days supply | Qty: 2 | Fill #2

## 2019-06-22 MED FILL — ?CARVEDILOL 3.125 MG TABLET: 3.125 | 30 days supply | Qty: 60 | Fill #4

## 2019-06-22 MED FILL — LISINOPRIL 40 MG TABLET: 40 | 30 days supply | Qty: 30 | Fill #4

## 2019-07-24 ENCOUNTER — Other Ambulatory Visit: Payer: Self-pay | Admitting: Family Medicine

## 2019-07-24 MED FILL — LISINOPRIL 40 MG TABLET: 40 | 30 days supply | Qty: 30 | Fill #5

## 2019-07-24 MED FILL — ?CARVEDILOL 3.125 MG TABLET: 3.125 | 30 days supply | Qty: 60 | Fill #5

## 2019-07-24 MED FILL — ?ROSUVASTATIN CALCIUM 10 MG: 10 | 30 days supply | Qty: 30 | Fill #5

## 2019-07-24 MED FILL — ?AMLODIPINE BESYLATE 10 MG: 10 | 30 days supply | Qty: 30 | Fill #5

## 2019-07-24 MED FILL — ?HYDROCHLOROTHIAZIDE 25MG T: 25 | 30 days supply | Qty: 30 | Fill #5

## 2019-07-27 MED FILL — !toujeo SOLOSTAR 300 UNIT/M: 300 | 30 days supply | Qty: 2 | Fill #0

## 2019-09-01 ENCOUNTER — Telehealth: Payer: Self-pay | Admitting: Family Medicine

## 2019-09-01 ENCOUNTER — Ambulatory Visit: Payer: Self-pay | Attending: Family Medicine

## 2019-09-01 ENCOUNTER — Other Ambulatory Visit: Payer: Self-pay

## 2019-09-01 DIAGNOSIS — E119 Type 2 diabetes mellitus without complications: Secondary | ICD-10-CM

## 2019-09-01 DIAGNOSIS — I10 Essential (primary) hypertension: Secondary | ICD-10-CM

## 2019-09-01 DIAGNOSIS — I6522 Occlusion and stenosis of left carotid artery: Secondary | ICD-10-CM

## 2019-09-01 DIAGNOSIS — Z79899 Other long term (current) drug therapy: Secondary | ICD-10-CM

## 2019-09-01 DIAGNOSIS — E782 Mixed hyperlipidemia: Secondary | ICD-10-CM

## 2019-09-01 DIAGNOSIS — Z794 Long term (current) use of insulin: Secondary | ICD-10-CM

## 2019-09-01 LAB — HEMOGLOBIN A1C: Hemoglobin A1C: 5.2 — NL

## 2019-09-01 MED ORDER — HYDROCHLOROTHIAZIDE 25 MG PO TABS: 25.0000 mg | ORAL_TABLET | Freq: Every day | ORAL | 0 refills | Status: DC

## 2019-09-01 MED ORDER — LISINOPRIL 40 MG PO TABS: 40.0000 mg | ORAL_TABLET | Freq: Every day | ORAL | 0 refills | Status: DC

## 2019-09-01 MED ORDER — TOUJEO SOLOSTAR 300 UNIT/ML ~~LOC~~ SOPN: 15.0000 [IU] | PEN_INJECTOR | Freq: Every day | SUBCUTANEOUS | 0 refills | Status: DC

## 2019-09-01 MED FILL — !toujeo SOLOSTAR 300 UNIT/M: 300 | 30 days supply | Qty: 2 | Fill #0

## 2019-09-01 MED FILL — LISINOPRIL 40 MG TABLET: 40 | 30 days supply | Qty: 30 | Fill #0

## 2019-09-01 MED FILL — ?HYDROCHLOROTHIAZIDE 25MG T: 25 | 30 days supply | Qty: 30 | Fill #0

## 2019-09-01 NOTE — Telephone Encounter (Signed)
Refills sent for 1 month to hold her until her next appointment. She must keep that appointment for refills.

## 2019-09-01 NOTE — Telephone Encounter (Signed)
1) Medication(s) Requested (by name):Insulin Glargine, 1 Unit Dial, (TOUJEO SOLOSTAR) 300 UNIT/ML SOPN lisinopril (ZESTRIL) 40 MG tablet hydrochlorothiazide (HYDRODIURIL) 25 MG tablet   2) Pharmacy of Choice: [facility]   3) Special Requests:   Approved medications will be sent to the pharmacy, we will reach out if there is an issue.  Requests made after 3pm may not be addressed until the following business day!  If a patient is unsure of the name of the medication(s) please note and ask patient to call back when they are able to provide all info, do not send to responsible party until all information is available!

## 2019-09-02 LAB — COMPREHENSIVE METABOLIC PANEL WITH GFR
ALT: 36 IU/L — ABNORMAL HIGH (ref 0–32)
AST: 24 IU/L (ref 0–40)
Albumin/Globulin Ratio: 2 (ref 1.2–2.2)
Albumin: 5.1 g/dL — ABNORMAL HIGH (ref 3.8–4.9)
Alkaline Phosphatase: 71 IU/L (ref 39–117)
BUN/Creatinine Ratio: 37 — ABNORMAL HIGH (ref 9–23)
BUN: 20 mg/dL (ref 6–24)
Bilirubin Total: 0.7 mg/dL (ref 0.0–1.2)
CO2: 22 mmol/L (ref 20–29)
Calcium: 10.1 mg/dL (ref 8.7–10.2)
Chloride: 98 mmol/L (ref 96–106)
Creatinine, Ser: 0.54 mg/dL — ABNORMAL LOW (ref 0.57–1.00)
GFR calc Af Amer: 125 mL/min/1.73
GFR calc non Af Amer: 108 mL/min/1.73
Globulin, Total: 2.5 g/dL (ref 1.5–4.5)
Glucose: 130 mg/dL — ABNORMAL HIGH (ref 65–99)
Potassium: 4.4 mmol/L (ref 3.5–5.2)
Sodium: 137 mmol/L (ref 134–144)
Total Protein: 7.6 g/dL (ref 6.0–8.5)

## 2019-09-02 LAB — LIPID PANEL
Chol/HDL Ratio: 4.7 ratio — ABNORMAL HIGH (ref 0.0–4.4)
Cholesterol, Total: 269 mg/dL — ABNORMAL HIGH (ref 100–199)
HDL: 57 mg/dL
LDL Chol Calc (NIH): 175 mg/dL — ABNORMAL HIGH (ref 0–99)
Triglycerides: 200 mg/dL — ABNORMAL HIGH (ref 0–149)
VLDL Cholesterol Cal: 37 mg/dL (ref 5–40)

## 2019-09-11 ENCOUNTER — Ambulatory Visit: Payer: Self-pay | Attending: Family Medicine | Admitting: Family Medicine

## 2019-09-11 ENCOUNTER — Encounter: Payer: Self-pay | Admitting: Family Medicine

## 2019-09-11 ENCOUNTER — Other Ambulatory Visit: Payer: Self-pay

## 2019-09-11 VITALS — BP 142/75 | HR 101 | Temp 98.2°F | Ht 62.0 in | Wt 125.0 lb

## 2019-09-11 DIAGNOSIS — I10 Essential (primary) hypertension: Secondary | ICD-10-CM

## 2019-09-11 DIAGNOSIS — E119 Type 2 diabetes mellitus without complications: Secondary | ICD-10-CM

## 2019-09-11 DIAGNOSIS — R0989 Other specified symptoms and signs involving the circulatory and respiratory systems: Secondary | ICD-10-CM

## 2019-09-11 DIAGNOSIS — G479 Sleep disorder, unspecified: Secondary | ICD-10-CM

## 2019-09-11 DIAGNOSIS — I6522 Occlusion and stenosis of left carotid artery: Secondary | ICD-10-CM

## 2019-09-11 DIAGNOSIS — E782 Mixed hyperlipidemia: Secondary | ICD-10-CM

## 2019-09-11 DIAGNOSIS — Z794 Long term (current) use of insulin: Secondary | ICD-10-CM

## 2019-09-11 DIAGNOSIS — Z23 Encounter for immunization: Secondary | ICD-10-CM

## 2019-09-11 MED ORDER — BD PEN NEEDLE SHORT U/F 31G X 8 MM MISC: 1.0000 "application " | Freq: Every day | 3 refills | Status: DC

## 2019-09-11 MED ORDER — HYDROCHLOROTHIAZIDE 25 MG PO TABS: 25.0000 mg | ORAL_TABLET | Freq: Every day | ORAL | 1 refills | Status: DC

## 2019-09-11 MED ORDER — TRAZODONE HCL 50 MG PO TABS: ORAL_TABLET | ORAL | 1 refills | Status: DC

## 2019-09-11 MED ORDER — ROSUVASTATIN CALCIUM 10 MG PO TABS: 20.0000 mg | ORAL_TABLET | Freq: Every day | ORAL | 1 refills | Status: DC

## 2019-09-11 MED ORDER — CARVEDILOL 3.125 MG PO TABS: 3.1250 mg | ORAL_TABLET | Freq: Two times a day (BID) | ORAL | 1 refills | Status: DC

## 2019-09-11 MED ORDER — LISINOPRIL 40 MG PO TABS: 40.0000 mg | ORAL_TABLET | Freq: Every day | ORAL | 1 refills | Status: DC

## 2019-09-11 MED ORDER — AMLODIPINE BESYLATE 10 MG PO TABS: 10.0000 mg | ORAL_TABLET | Freq: Every day | ORAL | 1 refills | Status: DC

## 2019-09-11 MED ORDER — TOUJEO SOLOSTAR 300 UNIT/ML ~~LOC~~ SOPN: 15.0000 [IU] | PEN_INJECTOR | Freq: Every day | SUBCUTANEOUS | 1 refills | Status: DC

## 2019-09-11 MED FILL — ?ROSUVASTATIN CALCIUM 10 MG: 10 | 30 days supply | Qty: 60 | Fill #0

## 2019-09-11 MED FILL — ?AMLODIPINE BESYLATE 10 MG: 10 | 30 days supply | Qty: 30 | Fill #0

## 2019-09-11 MED FILL — TRUEPLUS PEN NDL 31GX5/16": 31G X 8 MM | 30 days supply | Qty: 100 | Fill #0

## 2019-09-11 MED FILL — TRUEPLUS PEN NDL 31GX5/16: 31G X 8 MM | 30 days supply | Qty: 100 | Fill #0

## 2019-09-11 MED FILL — ?CARVEDILOL 3.125 MG TABLET: 3.125 | 30 days supply | Qty: 60 | Fill #0

## 2019-09-11 MED FILL — ?traZODONE HCL 50MG TAB: 50 | 30 days supply | Qty: 30 | Fill #0

## 2019-09-11 NOTE — Patient Instructions (Addendum)
Carotid Artery Disease  The carotid arteries are arteries on both sides of the neck. They carry blood to the brain, face, and neck. Carotid artery disease happens when these arteries become smaller (narrow) or get blocked. If these arteries become smaller or get blocked, you are more likely to have a stroke or a warning stroke (transient ischemic attack). Follow these instructions at home:  Take over-the-counter and prescription medicines only as told by your doctor.  Make sure you understand all instructions about your medicines. Do not stop taking your medicines without talking to your doctor first.  Follow your doctor's diet instructions. It is important to follow a healthy diet. ? Eat foods that include plenty of: ? Fresh fruits. ? Vegetables. ? Lean meats. ? Avoid these foods: ? Foods that are high in fat. ? Foods that are high in salt (sodium). ? Foods that are fried. ? Foods that are processed. ? Foods that have few good nutrients (poor nutritional value).  Keep a healthy weight.  Stay active. Get at least 30 minutes of activity every day.  Do not smoke.  Limit alcohol use to: ? No more than 2 drinks a day for men. ? No more than 1 drink a day for women who are not pregnant.  Do not use illegal drugs.  Keep all follow-up visits as told by your doctor. This is important. Contact a doctor if: Get help right away if:  You have any symptoms of stroke or TIA. The acronym BEFAST is an easy way to remember the main warning signs of stroke. ? B = Balance problems. Signs include dizziness, sudden trouble walking, or loss of balance ? E = Eye problems. This includes trouble seeing or a sudden change in vision. ? F = Face changes. This includes sudden weakness or numbness of the face, or the face or eyelid drooping to one side. ? A = Arm weakness or numbness. This happens suddenly and usually on one side of the body. ? S = Speech problems. This includes trouble speaking or  trouble understanding. ? T = Time. Time to call 911 or seek emergency care. Do not wait to see if symptoms go away. Make note of the time your symptoms started.  Other signs of stroke may include: ? A sudden, severe headache with no known cause. ? Feeling sick to your stomach (nauseous) or throwing up (vomiting). ? Seizure. Call your local emergency services (911 in [place] Do notdrive yourself to the clinic or hospital. Summary  The carotid arteries are arteries on both sides of the neck.  If these arteries get smaller or get blocked, you are more likely to have a stroke or a warning stroke (transient ischemic attack).  Take over-the-counter and prescription medicines only as told by your doctor.  Keep all follow-up visits as told by your doctor. This is important. This information is not intended to replace advice given to you by your health care provider. Make sure you discuss any questions you have with your health care provider. Document Released: 09/03/2012 Document Revised: 09/12/2017 Document Reviewed: 09/12/2017 Elsevier Patient Education  2020 [place].  Preventing High Cholesterol Cholesterol is a white, waxy substance similar to fat that the human body needs to help build cells. The liver makes all the cholesterol that a person's body needs. Having high cholesterol (hypercholesterolemia) increases a person's risk for heart disease and stroke. Extra (excess) cholesterol comes from the food the person eats. High cholesterol can often be prevented with diet and lifestyle  changes. If you already have high cholesterol, you can control it with diet and lifestyle changes and with medicine. How can high cholesterol affect me? If you have high cholesterol, deposits (plaques) may build up on the walls of your arteries. The arteries are the blood vessels that carry blood away from your heart. Plaques make the arteries narrower and stiffer. This can limit or block blood flow and cause  blood clots to form. Blood clots:  Are tiny balls of cells that form in your blood.  Can move to the heart or brain, causing a heart attack or stroke. Plaques in arteries greatly increase your risk for heart attack and stroke.Making diet and lifestyle changes can reduce your risk for these conditions that may threaten your life. What can increase my risk? This condition is more likely to develop in people who:  Eat foods that are high in saturated fat or cholesterol. Saturated fat is mostly found in: ? Foods that contain animal fat, such as red meat and some dairy products. ? Certain fatty foods made from plants, such as tropical oils.  Are overweight.  Are not getting enough exercise.  Have a family history of high cholesterol. What actions can I take to prevent this? Nutrition   Eat less saturated fat.  Avoid trans fats (partially hydrogenated oils). These are often found in margarine and in some baked goods, fried foods, and snacks bought in packages.  Avoid precooked or cured meat, such as sausages or meat loaves.  Avoid foods and drinks that have added sugars.  Eat more fruits, vegetables, and whole grains.  Choose healthy sources of protein, such as fish, poultry, lean cuts of red meat, beans, peas, lentils, and nuts.  Choose healthy sources of fat, such as: ? Nuts. ? Vegetable oils, especially olive oil. ? Fish that have healthy fats (omega-3 fatty acids), such as mackerel or salmon. The items listed above may not be a complete list of recommended foods and beverages. Contact a dietitian for more information. Lifestyle  Lose weight if you are overweight. Losing 5-10 lb (2.3-4.5 kg) can help prevent or control high cholesterol. It can also lower your risk for diabetes and high blood pressure. Ask your health care provider to help you with a diet and exercise plan to lose weight safely.  Do not use any products that contain nicotine or tobacco, such as cigarettes,  e-cigarettes, and chewing tobacco. If you need help quitting, ask your health care provider.  Limit your alcohol intake. ? Do not drink alcohol if:  Your health care provider tells you not to drink.  You are pregnant, may be pregnant, or are planning to become pregnant. ? If you drink alcohol:  Limit how much you use to:  0-1 drink a day for women.  0-2 drinks a day for men.  Be aware of how much alcohol is in your drink. In the [place] one drink equals one 12 oz bottle of beer (355 mL), one 5 oz glass of wine (148 mL), or one 1 oz glass of hard liquor (44 mL). Activity   Get enough exercise. Each week, do at least 150 minutes of exercise that takes a medium level of effort (moderate-intensity exercise). ? This is exercise that:  Makes your heart beat faster and makes you breathe harder than usual.  Allows you to still be able to talk. ? You could exercise in short sessions several times a day or longer sessions a few times a week. For example, on 5  days each week, you could walk fast or ride your bike 3 times a day for 10 minutes each time.  Do exercises as told by your health care provider. Medicines  In addition to diet and lifestyle changes, your health care provider may recommend medicines to help lower cholesterol. This may be a medicine to lower the amount of cholesterol your liver makes. You may need medicine if: ? Diet and lifestyle changes do not lower your cholesterol enough. ? You have high cholesterol and other risk factors for heart disease or stroke.  Take over-the-counter and prescription medicines only as told by your health care provider. General information  Manage your risk factors for high cholesterol. Talk with your health care provider about all your risk factors and how to lower your risk.  Manage other conditions that you have, such as diabetes or high blood pressure (hypertension).  Have blood tests to check your cholesterol levels at regular points  in time as told by your health care provider.  Keep all follow-up visits as told by your health care provider. This is important. Where to find more information  American Heart Association: www.heart.org  National Heart, Lung, and Blood Institute: https://wilson-eaton.com/ Summary  High cholesterol increases your risk for heart disease and stroke. By keeping your cholesterol level low, you can reduce your risk for these conditions.  High cholesterol can often be prevented with diet and lifestyle changes.  Work with your health care provider to manage your risk factors, and have your blood tested regularly. This information is not intended to replace advice given to you by your health care provider. Make sure you discuss any questions you have with your health care provider. Document Released: 10/02/2015 Document Revised: 01/09/2019 Document Reviewed: 05/26/2016 Elsevier Patient Education  2020 [employer]. Handout provided on

## 2019-09-11 NOTE — Progress Notes (Signed)
Established Patient Office Visit  Subjective:  Patient ID: [name], female    DOB: Feb 24, 1966  Age: 53 y.o. MRN: [id number]  CC:  Chief Complaint  Patient presents with  . Diabetes    HPI Jon Kasparek, 53 year old female, who presents in follow-up of type 2 diabetes, hyperlipidemia, hypertension and carotid stenosis.  Patient reports that she feels that she is doing well at this time.  She has been trying to watch her diet and lose weight.  She reports compliance with her medications and denies any headaches or dizziness related to her blood pressure.  She denies any issues with chest pain or palpitations.  No episodes of focal numbness or weakness.  She did see her vascular surgeon earlier in the year regarding her carotid stenosis.  She is taking her cholesterol medicine and denies any increased muscle or joint pain.  She denies any episodes of loss of balance or dizziness.  No loss of vision in 1 eye or sensation of darkening of the vision.         She believes that her blood sugars have remained well controlled on her current medications.  She does need refills of her medications.  She reports no current issues with urinary frequency, no increased thirst and no blurred vision related to her blood pressure.  She feels that her fasting blood sugars are usually in the 120s or less and she has had no significant hypoglycemic episodes.  She reports that she and her husband were able to buy camper and have been able to take trips which has helped with stress relief.  Patient is able to go with her husband to different camp sites and avoid crowded areas which could lead to COVID-19 exposure.  She continues to have difficulty with sleep and would like a refill of trazodone.  Past Medical History:  Diagnosis Date  . Anxiety    At age of 37  . Gestational diabetes 50   when pregnant with daughter   . Hypertension 1999   At age of 3    Past Surgical History:  Procedure Laterality Date    . ABDOMINAL HYSTERECTOMY      Family History  Problem Relation Age of Onset  . Hypertension Mother   . Diabetes Father   . Hypertension Sister   . Hypertension Brother     Social History   Socioeconomic History  . Marital status: Divorced    Spouse name: Not on file  . Number of children: 1   . Years of education: 86   . Highest education level: Not on file  Occupational History  . Occupation: Housekeeper   Tobacco Use  . Smoking status: Former Smoker    Packs/day: 1.00    Types: Cigarettes  . Smokeless tobacco: Never Used  Substance and Sexual Activity  . Alcohol use: Yes    Alcohol/week: 0.0 standard drinks    Comment: 40 oz daily   . Drug use: No  . Sexual activity: Yes    Partners: Male    Birth control/protection: Other-see comments  Other Topics Concern  . Not on file  Social History Narrative   Lives with boyfriend of 6 years.    Daughter   Dog    Social Determinants of Health   Financial Resource Strain:   . Difficulty of Paying Living Expenses: Not on file  Food Insecurity:   . Worried About Charity fundraiser in the Last Year: Not on file  . Ran Out of  Food in the Last Year: Not on file  Transportation Needs:   . Lack of Transportation (Medical): Not on file  . Lack of Transportation (Non-Medical): Not on file  Physical Activity:   . Days of Exercise per Week: Not on file  . Minutes of Exercise per Session: Not on file  Stress:   . Feeling of Stress : Not on file  Social Connections:   . Frequency of Communication with Friends and Family: Not on file  . Frequency of Social Gatherings with Friends and Family: Not on file  . Attends Religious Services: Not on file  . Active Member of Clubs or Organizations: Not on file  . Attends Archivist Meetings: Not on file  . Marital Status: Not on file  Intimate Partner Violence:   . Fear of Current or Ex-Partner: Not on file  . Emotionally Abused: Not on file  . Physically Abused: Not on  file  . Sexually Abused: Not on file    Outpatient Medications Prior to Visit  Medication Sig Dispense Refill  . Blood Glucose Monitoring Suppl (TRUE METRIX METER) W/DEVICE KIT 1 each by Does not apply route as needed. 1 kit 0  . glucose blood (TRUE METRIX BLOOD GLUCOSE TEST) test strip 1 each by Other route 3 (three) times daily. 100 each 11  . MELATONIN PO Take 1 tablet by mouth at bedtime as needed (sleep).     . TRUEPLUS LANCETS 28G MISC 1 each by Does not apply route 3 (three) times daily. 100 each 11  . amLODipine (NORVASC) 10 MG tablet Take 1 tablet (10 mg total) by mouth at bedtime. To lower blood pressure 90 tablet 1  . carvedilol (COREG) 3.125 MG tablet Take 1 tablet (3.125 mg total) by mouth 2 (two) times daily with a meal. To lower blood pressure 180 tablet 1  . hydrochlorothiazide (HYDRODIURIL) 25 MG tablet Take 1 tablet (25 mg total) by mouth daily. To lower blood pressure 30 tablet 0  . Insulin Glargine, 1 Unit Dial, (TOUJEO SOLOSTAR) 300 UNIT/ML SOPN Inject 15 Units into the skin daily. 1.5 mL 0  . Insulin Pen Needle (B-D ULTRAFINE III SHORT PEN) 31G X 8 MM MISC 1 application by Does not apply route daily. 30 each 11  . lisinopril (ZESTRIL) 40 MG tablet Take 1 tablet (40 mg total) by mouth daily. To lower blood pressure 30 tablet 0  . naproxen (NAPROSYN) 500 MG tablet Take 1 tablet (500 mg total) by mouth 2 (two) times daily. (Patient not taking: Reported on 09/15/2019) 10 tablet 0  . rosuvastatin (CRESTOR) 10 MG tablet Take 1 tablet (10 mg total) by mouth daily. To lower cholesterol 90 tablet 1  . traZODone (DESYREL) 50 MG tablet Take 1/2-1 tablet by mouth at bedtime prn sleep. Must have office visit for refills. 30 tablet 0  . nicotine (NICODERM CQ) 14 mg/24hr patch Place 1 patch (14 mg total) onto the skin daily. Then start 7 mg patch once per day (Patient not taking: Reported on 09/11/2019) 28 patch 0  . nicotine (NICODERM CQ) 7 mg/24hr patch Place 1 patch (7 mg total) onto  the skin daily. After completing 14 mg dose patch for 4 weeks (Patient not taking: Reported on 03/19/2019) 28 patch 1   No facility-administered medications prior to visit.    No Known Allergies  ROS Review of Systems  Constitutional: Positive for fatigue (Occasional). Negative for chills and fever.  HENT: Negative for sore throat and trouble swallowing.   Eyes:  Negative for photophobia and visual disturbance.  Respiratory: Positive for cough (Occasional nonproductive cough which she associates with smoking). Negative for shortness of breath.   Cardiovascular: Negative for chest pain, palpitations and leg swelling.  Gastrointestinal: Negative for abdominal pain, blood in stool, constipation and diarrhea.  Endocrine: Negative for polydipsia, polyphagia and polyuria.  Genitourinary: Negative for dysuria and frequency.  Musculoskeletal: Positive for arthralgias (Occasional) and back pain (Occasional).  Neurological: Negative for dizziness and headaches.  Hematological: Negative for adenopathy. Does not bruise/bleed easily.  Psychiatric/Behavioral: Positive for sleep disturbance. Negative for self-injury and suicidal ideas. The patient is nervous/anxious.       Objective:    Physical Exam  Constitutional: She is oriented to person, place, and time. She appears well-developed and well-nourished.  Small framed older female in NAD wearing mask as per office COVID-19 protocol  Neck: No JVD present. No thyromegaly present.  Cardiovascular: Normal rate and regular rhythm.  Left carotid bruit  Pulmonary/Chest: Effort normal and breath sounds normal.  Abdominal: Soft. There is no abdominal tenderness. There is no rebound and no guarding.  Musculoskeletal:        General: No tenderness or edema. Normal range of motion.     Cervical back: Normal range of motion and neck supple.  Lymphadenopathy:    She has no cervical adenopathy.  Neurological: She is alert and oriented to person, place, and  time.  Skin: Skin is warm and dry.  No active skin breakdown on the feet; good nail care  Psychiatric: She has a normal mood and affect. Her behavior is normal.  Diabetic foot exam-1+ dorsalis pedis and posterior tibial pulses, normal monofilament exam, patient does have dry skin on the feet.  Good nail care, nails do not appear thickened but patient does have nail polish on.  No active skin breakdown on the feet. BP (!) 142/75   Pulse (!) 101   Temp 98.2 F (36.8 C) (Oral)   Ht '5\' 2"'  (1.575 m)   Wt 125 lb (56.7 kg)   SpO2 99%   BMI 22.86 kg/m  Wt Readings from Last 3 Encounters:  09/15/19 121 lb 0.5 oz (54.9 kg)  09/11/19 125 lb (56.7 kg)  03/19/19 127 lb (57.6 kg)     Health Maintenance Due  Topic Date Due  . OPHTHALMOLOGY EXAM  10/01/2017  . FOOT EXAM  05/29/2018  . URINE MICROALBUMIN  06/24/2019   Patient was offered and agreed to have influenza immunization at today's visit  Lab Results  Component Value Date   TSH 3.119 05/18/2010   Lab Results  Component Value Date   WBC 11.6 (H) 09/17/2019   HGB 11.1 (L) 09/17/2019   HCT 33.3 (L) 09/17/2019   MCV 99.1 09/17/2019   PLT 197 09/17/2019   Lab Results  Component Value Date   NA 137 09/17/2019   K 3.1 (L) 09/17/2019   CO2 25 09/17/2019   GLUCOSE 125 (H) 09/17/2019   BUN 6 09/17/2019   CREATININE 0.50 09/17/2019   BILITOT 0.8 09/17/2019   ALKPHOS 50 09/17/2019   AST 14 (L) 09/17/2019   ALT 20 09/17/2019   PROT 6.3 (L) 09/17/2019   ALBUMIN 3.2 (L) 09/17/2019   CALCIUM 8.2 (L) 09/17/2019   ANIONGAP 12 09/17/2019   Lab Results  Component Value Date   CHOL 269 (H) 09/01/2019   Lab Results  Component Value Date   HDL 57 09/01/2019   Lab Results  Component Value Date   LDLCALC 175 (H) 09/01/2019  Lab Results  Component Value Date   TRIG 200 (H) 09/01/2019   Lab Results  Component Value Date   CHOLHDL 4.7 (H) 09/01/2019   Lab Results  Component Value Date   HGBA1C 5.2 (NE) 09/01/2019       Assessment & Plan:  1. Controlled type 2 diabetes mellitus without complication, with long-term current use of insulin ([place]) Patient had blood work done prior to today's visit and most recent hemoglobin A1c was 5.2.  Patient denies any significant hypoglycemic episodes.  She is provided with refills of current medications including Toujeo and insulin pen needles.  She is to continue to monitor her blood sugars and continue a healthy diet. - Insulin Pen Needle (B-D ULTRAFINE III SHORT PEN) 31G X 8 MM MISC; 1 application by Does not apply route daily.  Dispense: 90 each; Refill: 3 - rosuvastatin (CRESTOR) 10 MG tablet; Take 2 tablets (20 mg total) by mouth daily. To lower cholesterol (Patient taking differently: Take 10 mg by mouth daily. )  Dispense: 90 tablet; Refill: 1  2. Carotid stenosis, left; 5.  Bruit of left carotid artery; 3.  Mixed hyperlipidemia Patient with lipid panel done on 09/01/2019 prior to today's visit and patient with continued hyperlipidemia with cholesterol of 269, triglycerides of 200 and LDL of 175.  Patient was contacted and told to make sure that she restarts daily use of her Crestor and her dose of Crestor is being increased to 20 mg daily.  She will also be scheduled for repeat carotid Dopplers as she was previously noncompliant with daily use of cholesterol medicine and on carotid ultrasound on 03/16/2019 she had 6 to 79% stenosis in the left internal carotid artery and 1 to 39% right stenosis.  Handout provided on carotid artery disease and hyperlipidemia as part of AVS-after visit summary.  Continue vascular surgery follow-up. - VAS US CAROTID; Future  4. Essential hypertension We will have patient continue her current medications of amlodipine and carvedilol.  Blood pressure slightly above goal of 140/90 or less but patient has carotid stenosis therefore she may need to keep blood pressure around 140/90 or slightly above for perfusion pressure. - amLODipine (NORVASC) 10 MG  tablet; Take 1 tablet (10 mg total) by mouth at bedtime. To lower blood pressure  Dispense: 90 tablet; Refill: 1 - carvedilol (COREG) 3.125 MG tablet; Take 1 tablet (3.125 mg total) by mouth 2 (two) times daily with a meal. To lower blood pressure  Dispense: 180 tablet; Refill: 1  7. Sleeping difficulty Refill of trazodone provided today's visit to help with insomnia/sleep difficulty. - traZODone (DESYREL) 50 MG tablet; Take 1/2-1 tablet by mouth at bedtime prn sleep. (Patient taking differently: Take 50 mg by mouth at bedtime. )  Dispense: 90 tablet; Refill: 1  8. Need for immunization against influenza Patient was offered and agreed to have influenza immunization at today's visit.  Educational handout provided regarding immunization.  Immunization will be given as nurse visit while she is here today.    An After Visit Summary was printed and given to the patient.  Follow-up: Return in about 4 months (around 01/10/2020) for chronic issues/fasting labs.    Antony Blackbird, MD

## 2019-09-15 ENCOUNTER — Emergency Department (HOSPITAL_BASED_OUTPATIENT_CLINIC_OR_DEPARTMENT_OTHER): Payer: Self-pay

## 2019-09-15 ENCOUNTER — Other Ambulatory Visit: Payer: Self-pay

## 2019-09-15 ENCOUNTER — Encounter (HOSPITAL_BASED_OUTPATIENT_CLINIC_OR_DEPARTMENT_OTHER): Payer: Self-pay | Admitting: Emergency Medicine

## 2019-09-15 ENCOUNTER — Ambulatory Visit (HOSPITAL_COMMUNITY): Payer: Self-pay

## 2019-09-15 ENCOUNTER — Inpatient Hospital Stay (HOSPITAL_BASED_OUTPATIENT_CLINIC_OR_DEPARTMENT_OTHER)
Admission: EM | Admit: 2019-09-15 | Discharge: 2019-09-17 | DRG: 392 | Disposition: A | Discharge status: Home / Self Care | Payer: Self-pay | Attending: Internal Medicine | Admitting: Internal Medicine

## 2019-09-15 DIAGNOSIS — Z8249 Family history of ischemic heart disease and other diseases of the circulatory system: Secondary | ICD-10-CM

## 2019-09-15 DIAGNOSIS — A09 Infectious gastroenteritis and colitis, unspecified: Principal | ICD-10-CM | POA: Diagnosis present

## 2019-09-15 DIAGNOSIS — I10 Essential (primary) hypertension: Secondary | ICD-10-CM | POA: Diagnosis present

## 2019-09-15 DIAGNOSIS — R6510 Systemic inflammatory response syndrome (SIRS) of non-infectious origin without acute organ dysfunction: Secondary | ICD-10-CM | POA: Diagnosis present

## 2019-09-15 DIAGNOSIS — E1165 Type 2 diabetes mellitus with hyperglycemia: Secondary | ICD-10-CM | POA: Diagnosis present

## 2019-09-15 DIAGNOSIS — Z20828 Contact with and (suspected) exposure to other viral communicable diseases: Secondary | ICD-10-CM | POA: Diagnosis present

## 2019-09-15 DIAGNOSIS — Z794 Long term (current) use of insulin: Secondary | ICD-10-CM

## 2019-09-15 DIAGNOSIS — IMO0002 Reserved for concepts with insufficient information to code with codable children: Secondary | ICD-10-CM | POA: Diagnosis present

## 2019-09-15 DIAGNOSIS — E782 Mixed hyperlipidemia: Secondary | ICD-10-CM | POA: Diagnosis present

## 2019-09-15 DIAGNOSIS — F419 Anxiety disorder, unspecified: Secondary | ICD-10-CM | POA: Diagnosis present

## 2019-09-15 DIAGNOSIS — Z90710 Acquired absence of both cervix and uterus: Secondary | ICD-10-CM

## 2019-09-15 DIAGNOSIS — Z87891 Personal history of nicotine dependence: Secondary | ICD-10-CM

## 2019-09-15 DIAGNOSIS — R112 Nausea with vomiting, unspecified: Secondary | ICD-10-CM

## 2019-09-15 DIAGNOSIS — Z833 Family history of diabetes mellitus: Secondary | ICD-10-CM

## 2019-09-15 DIAGNOSIS — D62 Acute posthemorrhagic anemia: Secondary | ICD-10-CM | POA: Diagnosis present

## 2019-09-15 DIAGNOSIS — K7010 Alcoholic hepatitis without ascites: Secondary | ICD-10-CM | POA: Diagnosis present

## 2019-09-15 DIAGNOSIS — Z79891 Long term (current) use of opiate analgesic: Secondary | ICD-10-CM

## 2019-09-15 DIAGNOSIS — F1010 Alcohol abuse, uncomplicated: Secondary | ICD-10-CM | POA: Diagnosis present

## 2019-09-15 DIAGNOSIS — N179 Acute kidney failure, unspecified: Secondary | ICD-10-CM | POA: Diagnosis present

## 2019-09-15 DIAGNOSIS — Z7141 Alcohol abuse counseling and surveillance of alcoholic: Secondary | ICD-10-CM

## 2019-09-15 DIAGNOSIS — E860 Dehydration: Secondary | ICD-10-CM | POA: Diagnosis present

## 2019-09-15 DIAGNOSIS — R197 Diarrhea, unspecified: Secondary | ICD-10-CM

## 2019-09-15 DIAGNOSIS — E785 Hyperlipidemia, unspecified: Secondary | ICD-10-CM | POA: Diagnosis present

## 2019-09-15 DIAGNOSIS — K529 Noninfective gastroenteritis and colitis, unspecified: Secondary | ICD-10-CM | POA: Diagnosis present

## 2019-09-15 DIAGNOSIS — Z79899 Other long term (current) drug therapy: Secondary | ICD-10-CM

## 2019-09-15 LAB — CBC
HCT: 36 % (ref 36.0–46.0)
Hemoglobin: 11.9 g/dL — ABNORMAL LOW (ref 12.0–15.0)
MCH: 33 pg (ref 26.0–34.0)
MCHC: 33.1 g/dL (ref 30.0–36.0)
MCV: 99.7 fL (ref 80.0–100.0)
Platelets: 185 10*3/uL (ref 150–400)
RBC: 3.61 MIL/uL — ABNORMAL LOW (ref 3.87–5.11)
RDW: 12.5 % (ref 11.5–15.5)
WBC: 12.5 10*3/uL — ABNORMAL HIGH (ref 4.0–10.5)
nRBC: 0 % (ref 0.0–0.2)

## 2019-09-15 LAB — CBC WITH DIFFERENTIAL/PLATELET
Abs Immature Granulocytes: 0.05 10*3/uL (ref 0.00–0.07)
Basophils Absolute: 0.1 10*3/uL (ref 0.0–0.1)
Basophils Relative: 1 %
Eosinophils Absolute: 0 10*3/uL (ref 0.0–0.5)
Eosinophils Relative: 0 %
HCT: 42.9 % (ref 36.0–46.0)
Hemoglobin: 14.7 g/dL (ref 12.0–15.0)
Immature Granulocytes: 0 %
Lymphocytes Relative: 9 %
Lymphs Abs: 1.4 10*3/uL (ref 0.7–4.0)
MCH: 32.5 pg (ref 26.0–34.0)
MCHC: 34.3 g/dL (ref 30.0–36.0)
MCV: 94.9 fL (ref 80.0–100.0)
Monocytes Absolute: 1.2 10*3/uL — ABNORMAL HIGH (ref 0.1–1.0)
Monocytes Relative: 8 %
Neutro Abs: 12.8 10*3/uL — ABNORMAL HIGH (ref 1.7–7.7)
Neutrophils Relative %: 82 %
Platelets: 276 10*3/uL (ref 150–400)
RBC: 4.52 MIL/uL (ref 3.87–5.11)
RDW: 12.7 % (ref 11.5–15.5)
WBC: 15.5 10*3/uL — ABNORMAL HIGH (ref 4.0–10.5)
nRBC: 0 % (ref 0.0–0.2)

## 2019-09-15 LAB — COMPREHENSIVE METABOLIC PANEL
ALT: 36 U/L (ref 0–44)
AST: 30 U/L (ref 15–41)
Albumin: 4.7 g/dL (ref 3.5–5.0)
Alkaline Phosphatase: 59 U/L (ref 38–126)
Anion gap: 19 — ABNORMAL HIGH (ref 5–15)
BUN: 51 mg/dL — ABNORMAL HIGH (ref 6–20)
CO2: 22 mmol/L (ref 22–32)
Calcium: 9.6 mg/dL (ref 8.9–10.3)
Chloride: 92 mmol/L — ABNORMAL LOW (ref 98–111)
Creatinine, Ser: 2.36 mg/dL — ABNORMAL HIGH (ref 0.44–1.00)
GFR calc Af Amer: 26 mL/min — ABNORMAL LOW (ref >60)
GFR calc non Af Amer: 23 mL/min — ABNORMAL LOW (ref >60)
Glucose, Bld: 226 mg/dL — ABNORMAL HIGH (ref 70–99)
Potassium: 3.7 mmol/L (ref 3.5–5.1)
Sodium: 133 mmol/L — ABNORMAL LOW (ref 135–145)
Total Bilirubin: 1 mg/dL (ref 0.3–1.2)
Total Protein: 8.3 g/dL — ABNORMAL HIGH (ref 6.5–8.1)

## 2019-09-15 LAB — URINALYSIS, MICROSCOPIC (REFLEX)

## 2019-09-15 LAB — URINALYSIS, ROUTINE W REFLEX MICROSCOPIC
Bilirubin Urine: NEGATIVE
Glucose, UA: NEGATIVE mg/dL
Ketones, ur: NEGATIVE mg/dL
Nitrite: NEGATIVE
Protein, ur: 100 mg/dL — AB
Specific Gravity, Urine: >1.03 — ABNORMAL HIGH (ref 1.005–1.030)
pH: 5.5 (ref 5.0–8.0)

## 2019-09-15 LAB — SARS CORONAVIRUS 2 AG (30 MIN TAT): SARS Coronavirus 2 Ag: NEGATIVE

## 2019-09-15 LAB — LACTIC ACID, PLASMA
Lactic Acid, Venous: 3.4 mmol/L (ref 0.5–1.9)
Lactic Acid, Venous: 1.1 mmol/L (ref 0.5–1.9)

## 2019-09-15 LAB — PHOSPHORUS: Phosphorus: 5.2 mg/dL — ABNORMAL HIGH (ref 2.5–4.6)

## 2019-09-15 LAB — MAGNESIUM: Magnesium: 2.3 mg/dL (ref 1.7–2.4)

## 2019-09-15 LAB — SARS CORONAVIRUS 2 (TAT 6-24 HRS): SARS Coronavirus 2: NEGATIVE

## 2019-09-15 MED ORDER — ONDANSETRON HCL 4 MG/2ML IJ SOLN
4.0000 mg | Freq: Once | INTRAMUSCULAR | Status: AC
  Administered 2019-09-15: 08:00:00 4 mg via INTRAVENOUS
  Filled 2019-09-15: qty 2

## 2019-09-15 MED ORDER — LORAZEPAM 2 MG/ML IJ SOLN: 1.0000 mg | INTRAMUSCULAR | Status: DC | PRN

## 2019-09-15 MED ORDER — THIAMINE HCL 100 MG/ML IJ SOLN: 100.0000 mg | Freq: Every day | INTRAMUSCULAR | Status: DC

## 2019-09-15 MED ORDER — SODIUM CHLORIDE 0.9 % IV BOLUS
1000.0000 mL | Freq: Once | INTRAVENOUS | Status: AC
  Administered 2019-09-15: 08:00:00 1000 mL via INTRAVENOUS

## 2019-09-15 MED ORDER — LACTATED RINGERS IV BOLUS
1000.0000 mL | Freq: Once | INTRAVENOUS | Status: AC
  Administered 2019-09-15: 09:00:00 1000 mL via INTRAVENOUS

## 2019-09-15 MED ORDER — PANTOPRAZOLE SODIUM 40 MG IV SOLR
40.0000 mg | Freq: Once | INTRAVENOUS | Status: AC
  Administered 2019-09-15: 10:00:00 40 mg via INTRAVENOUS
  Filled 2019-09-15: qty 40

## 2019-09-15 MED ORDER — LORAZEPAM 2 MG/ML IJ SOLN: 0.0000 mg | Freq: Four times a day (QID) | INTRAMUSCULAR | Status: DC

## 2019-09-15 MED ORDER — INSULIN ASPART 100 UNIT/ML ~~LOC~~ SOLN: 0.0000 [IU] | Freq: Three times a day (TID) | SUBCUTANEOUS | Status: DC

## 2019-09-15 MED ORDER — CIPROFLOXACIN IN D5W 400 MG/200ML IV SOLN
400.0000 mg | Freq: Once | INTRAVENOUS | Status: AC
  Administered 2019-09-15: 11:00:00 400 mg via INTRAVENOUS
  Filled 2019-09-15: qty 200

## 2019-09-15 MED ORDER — INSULIN GLARGINE 100 UNIT/ML ~~LOC~~ SOLN
15.0000 [IU] | Freq: Every day | SUBCUTANEOUS | Status: DC
  Filled 2019-09-15: qty 0.15

## 2019-09-15 MED ORDER — THIAMINE HCL 100 MG PO TABS
100.0000 mg | ORAL_TABLET | Freq: Every day | ORAL | Status: DC
  Administered 2019-09-15: 11:00:00 100 mg via ORAL
  Filled 2019-09-15: qty 1

## 2019-09-15 MED ORDER — POTASSIUM CHLORIDE IN NACL 20-0.9 MEQ/L-% IV SOLN
INTRAVENOUS | Status: DC
  Filled 2019-09-15 (×2): qty 1000

## 2019-09-15 MED ORDER — ACETAMINOPHEN 650 MG RE SUPP: 650.0000 mg | Freq: Four times a day (QID) | RECTAL | Status: DC | PRN

## 2019-09-15 MED ORDER — TRAZODONE HCL 50 MG PO TABS
50.0000 mg | ORAL_TABLET | Freq: Every day | ORAL | Status: DC
  Administered 2019-09-15 – 2019-09-16 (×2): 50 mg via ORAL
  Filled 2019-09-15 (×2): qty 1

## 2019-09-15 MED ORDER — LORAZEPAM 2 MG/ML IJ SOLN: 0.0000 mg | Freq: Two times a day (BID) | INTRAMUSCULAR | Status: DC

## 2019-09-15 MED ORDER — ACETAMINOPHEN 325 MG PO TABS
650.0000 mg | ORAL_TABLET | Freq: Four times a day (QID) | ORAL | Status: DC | PRN
  Administered 2019-09-16 (×2): 650 mg via ORAL
  Filled 2019-09-15 (×3): qty 2

## 2019-09-15 MED ORDER — FENTANYL CITRATE (PF) 100 MCG/2ML IJ SOLN
100.0000 ug | Freq: Once | INTRAMUSCULAR | Status: AC
  Administered 2019-09-15: 100 ug via INTRAVENOUS
  Filled 2019-09-15: qty 2

## 2019-09-15 MED ORDER — ADULT MULTIVITAMIN W/MINERALS CH
1.0000 | ORAL_TABLET | Freq: Every day | ORAL | Status: DC
  Administered 2019-09-16 – 2019-09-17 (×2): 1 via ORAL
  Filled 2019-09-15 (×2): qty 1

## 2019-09-15 MED ORDER — THIAMINE HCL 100 MG PO TABS
100.0000 mg | ORAL_TABLET | Freq: Every day | ORAL | Status: DC
  Administered 2019-09-16 – 2019-09-17 (×2): 100 mg via ORAL
  Filled 2019-09-15 (×2): qty 1

## 2019-09-15 MED ORDER — LORAZEPAM 1 MG PO TABS: 0.0000 mg | ORAL_TABLET | Freq: Two times a day (BID) | ORAL | Status: DC

## 2019-09-15 MED ORDER — ONDANSETRON HCL 4 MG/2ML IJ SOLN
4.0000 mg | Freq: Once | INTRAMUSCULAR | Status: AC
  Administered 2019-09-15: 4 mg via INTRAVENOUS
  Filled 2019-09-15: qty 2

## 2019-09-15 MED ORDER — ONDANSETRON HCL 4 MG PO TABS: 4.0000 mg | ORAL_TABLET | Freq: Four times a day (QID) | ORAL | Status: DC | PRN

## 2019-09-15 MED ORDER — SODIUM CHLORIDE 0.9 % IV SOLN
INTRAVENOUS | Status: DC | PRN
  Administered 2019-09-15 (×2): 250 mL via INTRAVENOUS

## 2019-09-15 MED ORDER — ROSUVASTATIN CALCIUM 10 MG PO TABS
10.0000 mg | ORAL_TABLET | Freq: Every day | ORAL | Status: DC
  Administered 2019-09-15 – 2019-09-17 (×3): 10 mg via ORAL
  Filled 2019-09-15 (×3): qty 1

## 2019-09-15 MED ORDER — LORAZEPAM 1 MG PO TABS
1.0000 mg | ORAL_TABLET | ORAL | Status: DC | PRN
  Administered 2019-09-16 (×2): 1 mg via ORAL
  Filled 2019-09-15 (×2): qty 1

## 2019-09-15 MED ORDER — MORPHINE SULFATE (PF) 4 MG/ML IV SOLN
4.0000 mg | Freq: Once | INTRAVENOUS | Status: AC
  Administered 2019-09-15: 4 mg via INTRAVENOUS
  Filled 2019-09-15: qty 1

## 2019-09-15 MED ORDER — LORAZEPAM 1 MG PO TABS
0.0000 mg | ORAL_TABLET | Freq: Four times a day (QID) | ORAL | Status: DC
  Administered 2019-09-15 (×2): 1 mg via ORAL
  Filled 2019-09-15 (×3): qty 1

## 2019-09-15 MED ORDER — METRONIDAZOLE IN NACL 5-0.79 MG/ML-% IV SOLN
500.0000 mg | Freq: Three times a day (TID) | INTRAVENOUS | Status: DC
  Administered 2019-09-15 – 2019-09-17 (×5): 500 mg via INTRAVENOUS
  Filled 2019-09-15 (×6): qty 100

## 2019-09-15 MED ORDER — ONDANSETRON HCL 4 MG/2ML IJ SOLN: 4.0000 mg | Freq: Four times a day (QID) | INTRAMUSCULAR | Status: DC | PRN

## 2019-09-15 MED ORDER — CARVEDILOL 3.125 MG PO TABS
3.1250 mg | ORAL_TABLET | Freq: Two times a day (BID) | ORAL | Status: DC
  Administered 2019-09-15 – 2019-09-17 (×4): 3.125 mg via ORAL
  Filled 2019-09-15 (×4): qty 1

## 2019-09-15 MED ORDER — FOLIC ACID 1 MG PO TABS
1.0000 mg | ORAL_TABLET | Freq: Every day | ORAL | Status: DC
  Administered 2019-09-16 – 2019-09-17 (×2): 1 mg via ORAL
  Filled 2019-09-15 (×2): qty 1

## 2019-09-15 MED ORDER — METOCLOPRAMIDE HCL 5 MG/ML IJ SOLN
10.0000 mg | INTRAMUSCULAR | Status: AC
  Administered 2019-09-15: 10 mg via INTRAVENOUS
  Filled 2019-09-15: qty 2

## 2019-09-15 MED ORDER — METRONIDAZOLE IN NACL 5-0.79 MG/ML-% IV SOLN
500.0000 mg | Freq: Once | INTRAVENOUS | Status: AC
  Administered 2019-09-15: 09:00:00 500 mg via INTRAVENOUS
  Filled 2019-09-15: qty 100

## 2019-09-15 MED ORDER — FENTANYL CITRATE (PF) 100 MCG/2ML IJ SOLN
25.0000 ug | INTRAMUSCULAR | Status: DC | PRN
  Administered 2019-09-15 – 2019-09-17 (×3): 25 ug via INTRAVENOUS
  Filled 2019-09-15 (×3): qty 2

## 2019-09-15 MED ORDER — AMLODIPINE BESYLATE 10 MG PO TABS
10.0000 mg | ORAL_TABLET | Freq: Every day | ORAL | Status: DC
  Administered 2019-09-15 – 2019-09-16 (×2): 10 mg via ORAL
  Filled 2019-09-15 (×2): qty 1

## 2019-09-15 MED ORDER — POTASSIUM CHLORIDE IN NACL 20-0.9 MEQ/L-% IV SOLN
INTRAVENOUS | Status: DC
  Filled 2019-09-15 (×3): qty 1000

## 2019-09-15 NOTE — ED Notes (Signed)
Pt reports inability to have BM at this time

## 2019-09-15 NOTE — ED Notes (Signed)
Pt denies CP

## 2019-09-15 NOTE — H&P (Addendum)
History and Physical    [name] FGH:[id number] DOB: [birth year] DOA: 09/15/2019  PCP: Antony Blackbird, MD  Patient coming from: Home.  Chief Complaint: Abdominal pain with nausea vomiting and diarrhea.  HPI: [name] is a 53 y.o. female with history of diabetes mellitus type 2, hypertension, hyperlipidemia, tobacco and alcohol abuse presents to the ER admits in [facility] with complaints of persistent diarrhea which is bloody with nausea vomiting and abdominal pain.  Abdominal pain is mostly colicky in nature lower abdominal quadrant mostly in the left side.  Patient symptoms have been ongoing since 2 days.  Has never had a colonoscopy previously.  Has not taken any recent antibiotics or any recent travel or sick contacts or did not eat any abnormal food or uncooked meat.  ED Course: In the ER patient was mildly febrile with temperature of 99.1 with heart rate of 120 labs show lactic acid of 3.4 creatinine of 2.3 which is increased 1.52 weeks ago and leukocytosis.  CT scanning of the abdomen and pelvis shows colitis involving the distal transverse colon and descending colon.  Patient had empiric antibiotic started IV fluids started and admitted for colitis likely either infectious versus ischemic versus inflammatory.  Review of Systems: As per HPI, rest all negative.   Past Medical History:  Diagnosis Date  . Anxiety    At age of 36  . Gestational diabetes 54   when pregnant with daughter   . Hypertension 1999   At age of 80    Past Surgical History:  Procedure Laterality Date  . ABDOMINAL HYSTERECTOMY       reports that she has quit smoking. Her smoking use included cigarettes. She smoked 1.00 pack per day. She has never used smokeless tobacco. She reports current alcohol use. She reports that she does not use drugs.  No Known Allergies  Family History  Problem Relation Age of Onset  . Hypertension Mother   . Diabetes Father   . Hypertension Sister   . Hypertension  Brother     Prior to Admission medications   Medication Sig Start Date End Date Taking? Authorizing Provider  amLODipine (NORVASC) 10 MG tablet Take 1 tablet (10 mg total) by mouth at bedtime. To lower blood pressure 09/11/19  Yes Fulp, Cammie, MD  Blood Glucose Monitoring Suppl (TRUE METRIX METER) W/DEVICE KIT 1 each by Does not apply route as needed. 02/21/15  Yes Funches, Adriana Mccallum, MD  carvedilol (COREG) 3.125 MG tablet Take 1 tablet (3.125 mg total) by mouth 2 (two) times daily with a meal. To lower blood pressure 09/11/19  Yes Fulp, Cammie, MD  glucose blood (TRUE METRIX BLOOD GLUCOSE TEST) test strip 1 each by Other route 3 (three) times daily. 02/16/19  Yes Fulp, Cammie, MD  Insulin Glargine, 1 Unit Dial, (TOUJEO SOLOSTAR) 300 UNIT/ML SOPN Inject 15 Units into the skin daily. 09/11/19 12/10/19 Yes Fulp, Cammie, MD  Insulin Pen Needle (B-D ULTRAFINE III SHORT PEN) 31G X 8 MM MISC 1 application by Does not apply route daily. 09/11/19  Yes Fulp, Cammie, MD  lisinopril (ZESTRIL) 40 MG tablet Take 1 tablet (40 mg total) by mouth daily. To lower blood pressure 09/11/19  Yes Fulp, Cammie, MD  MELATONIN PO Take 1 tablet by mouth at bedtime as needed (sleep).    Yes [provider]  rosuvastatin (CRESTOR) 10 MG tablet Take 2 tablets (20 mg total) by mouth daily. To lower cholesterol Patient taking differently: Take 10 mg by mouth daily.  09/11/19  Yes Fulp, Cammie, MD  traZODone (DESYREL) 50 MG tablet Take 1/2-1 tablet by mouth at bedtime prn sleep. Patient taking differently: Take 50 mg by mouth at bedtime.  09/11/19  Yes Fulp, Cammie, MD  TRUEPLUS LANCETS 28G MISC 1 each by Does not apply route 3 (three) times daily. 06/23/18  Yes Fulp, Cammie, MD  hydrochlorothiazide (HYDRODIURIL) 25 MG tablet Take 1 tablet (25 mg total) by mouth daily. To lower blood pressure 09/11/19   Fulp, Ander Gaster, MD    Physical Exam: Constitutional: Moderately built and nourished. Vitals:   09/15/19 1630 09/15/19  1730 09/15/19 1830 09/15/19 2106  BP: 108/76 117/82 128/84 135/87  Pulse: (!) 118 (!) 108 (!) 111 (!) 114  Resp:  16    Temp:    98.2 F (36.8 C)  TempSrc:    Oral  SpO2: 94% 94% 95% 95%  Weight:    54.9 kg  Height:       Eyes: Anicteric no pallor. ENMT: No discharge from the ears eyes nose or mouth. Neck: No mass or.  No neck rigidity. Respiratory: No rhonchi or crepitations. Cardiovascular: S1-S2 heard. Abdomen: Soft mild tenderness left lower quadrant no guarding or rigidity. Musculoskeletal: No edema. Skin: No rash. Neurologic: Alert awake oriented to time place and person.  Moves all extremities. Psychiatric: Appears normal but normal affect.   Labs on Admission: I have personally reviewed following labs and imaging studies  CBC: Recent Labs  Lab 09/15/19 0735  WBC 15.5*  NEUTROABS 12.8*  HGB 14.7  HCT 42.9  MCV 94.9  PLT 997   Basic Metabolic Panel: Recent Labs  Lab 09/15/19 0735  NA 133*  K 3.7  CL 92*  CO2 22  GLUCOSE 226*  BUN 51*  CREATININE 2.36*  CALCIUM 9.6  MG 2.3  PHOS 5.2*   GFR: Estimated Creatinine Clearance: 21.8 mL/min (A) (by C-G formula based on SCr of 2.36 mg/dL (H)). Liver Function Tests: Recent Labs  Lab 09/15/19 0735  AST 30  ALT 36  ALKPHOS 59  BILITOT 1.0  PROT 8.3*  ALBUMIN 4.7   No results for input(s): LIPASE, AMYLASE in the last 168 hours. No results for input(s): AMMONIA in the last 168 hours. Coagulation Profile: No results for input(s): INR, PROTIME in the last 168 hours. Cardiac Enzymes: No results for input(s): CKTOTAL, CKMB, CKMBINDEX, TROPONINI in the last 168 hours. BNP (last 3 results) No results for input(s): PROBNP in the last 8760 hours. HbA1C: No results for input(s): HGBA1C in the last 72 hours. CBG: No results for input(s): GLUCAP in the last 168 hours. Lipid Profile: No results for input(s): CHOL, HDL, LDLCALC, TRIG, CHOLHDL, LDLDIRECT in the last 72 hours. Thyroid Function Tests: No results  for input(s): TSH, T4TOTAL, FREET4, T3FREE, THYROIDAB in the last 72 hours. Anemia Panel: No results for input(s): VITAMINB12, FOLATE, FERRITIN, TIBC, IRON, RETICCTPCT in the last 72 hours. Urine analysis:    Component Value Date/Time   COLORURINE YELLOW 09/15/2019 0817   APPEARANCEUR CLOUDY (A) 09/15/2019 0817   LABSPEC >1.030 (H) 09/15/2019 0817   PHURINE 5.5 09/15/2019 0817   GLUCOSEU NEGATIVE 09/15/2019 0817   HGBUR SMALL (A) 09/15/2019 0817   BILIRUBINUR NEGATIVE 09/15/2019 0817   BILIRUBINUR small 02/21/2015 [place] 09/15/2019 0817   PROTEINUR 100 (A) 09/15/2019 0817   UROBILINOGEN >=8.0 02/21/2015 1157   UROBILINOGEN 1.0 05/17/2010 2341   NITRITE NEGATIVE 09/15/2019 0817   LEUKOCYTESUR SMALL (A) 09/15/2019 0817   Sepsis Labs: _0 (procalcitonin:4,lacticidven:4) ) Recent Results (  from the past 240 hour(s))  SARS CORONAVIRUS 2 (TAT 6-24 HRS) Nasopharyngeal Nasopharyngeal Swab     Status: None   Collection Time: 09/15/19  9:04 AM   Specimen: Nasopharyngeal Swab  Result Value Ref Range Status   SARS Coronavirus 2 NEGATIVE NEGATIVE Final    Comment: (NOTE) SARS-CoV-2 target nucleic acids are NOT DETECTED. The SARS-CoV-2 RNA is generally detectable in upper and lower respiratory specimens during the acute phase of infection. Negative results do not preclude SARS-CoV-2 infection, do not rule out co-infections with other pathogens, and should not be used as the sole basis for treatment or other patient management decisions. Negative results must be combined with clinical observations, patient history, and epidemiological information. The expected result is Negative. Fact Sheet for Patients: SugarRoll.be Fact Sheet for Healthcare Providers: https://www.woods-mathews.com/ This test is not yet approved or cleared by the [place] FDA and  has been authorized for detection and/or diagnosis of SARS-CoV-2  by FDA under an Emergency Use Authorization (EUA). This EUA will remain  in effect (meaning this test can be used) for the duration of the COVID-19 declaration under Section 56 4(b)(1) of the Act, 21 U.S.C. section 360bbb-3(b)(1), unless the authorization is terminated or revoked sooner. Performed at [facility], [address] [place], [place] [address]   SARS Coronavirus 2 Ag (30 min TAT) - Nasal Swab (BD Veritor Kit)     Status: None   Collection Time: 09/15/19 12:40 PM   Specimen: Nasal Swab (BD Veritor Kit)  Result Value Ref Range Status   SARS Coronavirus 2 Ag NEGATIVE NEGATIVE Final    Comment: (NOTE) SARS-CoV-2 antigen NOT DETECTED.  Negative results are presumptive.  Negative results do not preclude SARS-CoV-2 infection and should not be used as the sole basis for treatment or other patient management decisions, including infection  control decisions, particularly in the presence of clinical signs and  symptoms consistent with COVID-19, or in those who have been in contact with the virus.  Negative results must be combined with clinical observations, patient history, and epidemiological information. The expected result is Negative. Fact Sheet for Patients: PodPark.tn Fact Sheet for Healthcare Providers: GiftContent.is This test is not yet approved or cleared by the [place] FDA and  has been authorized for detection and/or diagnosis of SARS-CoV-2 by FDA under an Emergency Use Authorization (EUA).  This EUA will remain in effect (meaning this test can be used) for the duration of  the COVID-19 de claration under Section 564(b)(1) of the Act, 21 U.S.C. section 360bbb-3(b)(1), unless the authorization is terminated or revoked sooner. Performed at [facility], [address] [place], [place] [address]      Radiological Exams on Admission: CT ABDOMEN PELVIS WO CONTRAST  Result Date:  09/15/2019 CLINICAL DATA:  Diverticulitis suspected. Abdominal pain and bloody diarrhea EXAM: CT ABDOMEN AND PELVIS WITHOUT CONTRAST TECHNIQUE: Multidetector CT imaging of the abdomen and pelvis was performed following the standard protocol without IV contrast. COMPARISON:  08/04/2009 FINDINGS: Lower chest:  Coronary calcification and trace pericardial effusion. Hepatobiliary: No focal liver abnormality.No evidence of biliary obstruction or stone. Pancreas: Unremarkable. Spleen: Unremarkable. Adrenals/Urinary Tract: Negative adrenals. No hydronephrosis or stone. Unremarkable bladder. Stomach/Bowel: Colonic wall thickening with submucosal low-density appearance and pericolonic fat edema extending from the mid transverse colon to the distal descending colon. There is sparing of the rectum. No obstruction or small bowel inflammatory changes. Negative appendix. Vascular/Lymphatic: Atherosclerotic calcification. No mass or adenopathy. Reproductive:Hysterectomy. Other: Trace pelvic fluid.  No pneumoperitoneum Musculoskeletal: No acute abnormalities. IMPRESSION: 1. Colitis involving the distal transverse and descending colon. 2.  Aortic Atherosclerosis (ICD10-I70.0).  Coronary atherosclerosis. Electronically Signed   By: Monte Fantasia M.D.   On: 09/15/2019 08:53     Assessment/Plan Principal Problem:   SIRS (systemic inflammatory response syndrome) (HCC) Active Problems:   HTN (hypertension), malignant   Diabetes type 2, uncontrolled ([place])   ETOH abuse   Acute colitis   AKI (acute kidney injury) ([place])    1. Acute colitis involving the distal transverse and descending colon differentials of which includes infectious versus ischemic versus inflammatory.  Patient has not had any colonoscopy previously.  We will continue with IV fluids empiric antibiotics follow GI pathogen panel.  Consult gastroenterologist in the morning. 2. Diabetes mellitus type 2 we will keep patient on sliding scale coverage.  Hold  Lantus since CBGs running low. 3. Hypertension we will keep patient on as needed IV hydralazine hold ACE inhibitor and hydrochlorothiazide due to acute renal failure.  On amlodipine and Coreg which can be continued if blood pressure allows. 4. Acute renal failure likely from dehydration from diarrhea vomiting.  Follow metabolic panel closely with hydration. 5. EtOH abuse on CIWA protocol.  Advised about quitting. 6. Acute blood loss anemia follow CBC.  COVID-19 test is pending.  Since patient has abdominal pain with vomiting ongoing rectal bleeding and acute renal failure will need more than 2 midnight stay in inpatient status.   DVT prophylaxis: SCDs due to GI bleeding. Code Status: Full code. Family Communication: Discussed with patient. Disposition Plan: Home. Consults called: None. Admission status: Inpatient.   Rise Patience MD [facility] Pager [phone].  If 7PM-7AM, please contact night-coverage [email or web address] Password [email or web address]  09/15/2019, 10:15 PM

## 2019-09-15 NOTE — ED Provider Notes (Signed)
[facility] EMERGENCY DEPARTMENT Provider Note   CSN: [id number] Arrival date & time: 09/15/19  0707     History Chief Complaint  Patient presents with  . Abdominal Pain    [name] is a 53 y.o. female.  53 year old female with past medical history including type 2 diabetes mellitus, hypertension, alcohol abuse, hyperlipidemia who presents with vomiting, diarrhea, abdominal pain.  Patient states that 2 days ago she began having loose stools followed by bloody diarrhea associated with vomiting.  She has had generalized abdominal pain that is worse with vomiting but also present at rest.  She states that the bloody diarrhea has been persistent since it began.  No fevers, cough/cold symptoms, chest pain, shortness of breath, sick contacts, or recent travel.  No recent antibiotic use. No meds PTA.  The history is provided by the patient.  Abdominal Pain      Past Medical History:  Diagnosis Date  . Anxiety    At age of 88  . Gestational diabetes 73   when pregnant with daughter   . Hypertension 1999   At age of 89    Patient Active Problem List   Diagnosis Date Noted  . Acute colitis 09/15/2019  . Mixed hyperlipidemia 06/04/2017  . Alcoholic hepatitis without ascites 02/23/2015  . HTN (hypertension), malignant 02/21/2015  . Current smoker 02/21/2015  . Diabetes type 2, uncontrolled ([place]) 02/21/2015  . Poor dentition 02/21/2015  . ETOH abuse 02/21/2015    Past Surgical History:  Procedure Laterality Date  . ABDOMINAL HYSTERECTOMY       OB History   No obstetric history on file.     Family History  Problem Relation Age of Onset  . Hypertension Mother   . Diabetes Father   . Hypertension Sister   . Hypertension Brother     Social History   Tobacco Use  . Smoking status: Former Smoker    Packs/day: 1.00    Types: Cigarettes  . Smokeless tobacco: Never Used  Substance Use Topics  . Alcohol use: Yes    Alcohol/week: 0.0 standard drinks      Comment: 40 oz daily   . Drug use: No    Home Medications Prior to Admission medications   Medication Sig Start Date End Date Taking? Authorizing Provider  amLODipine (NORVASC) 10 MG tablet Take 1 tablet (10 mg total) by mouth at bedtime. To lower blood pressure 09/11/19   Fulp, Cammie, MD  Blood Glucose Monitoring Suppl (TRUE METRIX METER) W/DEVICE KIT 1 each by Does not apply route as needed. 02/21/15   Boykin Nearing, MD  carvedilol (COREG) 3.125 MG tablet Take 1 tablet (3.125 mg total) by mouth 2 (two) times daily with a meal. To lower blood pressure 09/11/19   Fulp, Cammie, MD  glucose blood (TRUE METRIX BLOOD GLUCOSE TEST) test strip 1 each by Other route 3 (three) times daily. 02/16/19   Fulp, Cammie, MD  hydrochlorothiazide (HYDRODIURIL) 25 MG tablet Take 1 tablet (25 mg total) by mouth daily. To lower blood pressure 09/11/19   Fulp, Cammie, MD  Insulin Glargine, 1 Unit Dial, (TOUJEO SOLOSTAR) 300 UNIT/ML SOPN Inject 15 Units into the skin daily. 09/11/19 12/10/19  Fulp, Cammie, MD  Insulin Pen Needle (B-D ULTRAFINE III SHORT PEN) 31G X 8 MM MISC 1 application by Does not apply route daily. 09/11/19   Fulp, Cammie, MD  lisinopril (ZESTRIL) 40 MG tablet Take 1 tablet (40 mg total) by mouth daily. To lower blood pressure 09/11/19   Fulp,  Cammie, MD  MELATONIN PO Take by mouth at bedtime.    [provider]  naproxen (NAPROSYN) 500 MG tablet Take 1 tablet (500 mg total) by mouth 2 (two) times daily. 02/28/19   Petrucelli, Samantha R, PA-C  nicotine (NICODERM CQ) 14 mg/24hr patch Place 1 patch (14 mg total) onto the skin daily. Then start 7 mg patch once per day Patient not taking: Reported on 09/11/2019 02/16/19   Fulp, Cammie, MD  nicotine (NICODERM CQ) 7 mg/24hr patch Place 1 patch (7 mg total) onto the skin daily. After completing 14 mg dose patch for 4 weeks Patient not taking: Reported on 03/19/2019 02/16/19   Fulp, Cammie, MD  rosuvastatin (CRESTOR) 10 MG tablet Take 2 tablets  (20 mg total) by mouth daily. To lower cholesterol 09/11/19   Fulp, Cammie, MD  traZODone (DESYREL) 50 MG tablet Take 1/2-1 tablet by mouth at bedtime prn sleep. 09/11/19   Fulp, Cammie, MD  TRUEPLUS LANCETS 28G MISC 1 each by Does not apply route 3 (three) times daily. 06/23/18   Antony Blackbird, MD    Allergies    Patient has no known allergies.  Review of Systems   Review of Systems  Gastrointestinal: Positive for abdominal pain.   All other systems reviewed and are negative except that which was mentioned in HPI  Physical Exam Updated Vital Signs BP (!) 143/82   Pulse (!) 101   Temp 98.1 F (36.7 C) (Oral)   Resp 18   Ht '5\' 2"'  (1.575 m)   Wt 56.7 kg   SpO2 99%   BMI 22.86 kg/m   Physical Exam Vitals and nursing note reviewed.  Constitutional:      General: She is not in acute distress.    Appearance: She is well-developed. She is ill-appearing.  HENT:     Head: Normocephalic and atraumatic.  Eyes:     Conjunctiva/sclera: Conjunctivae normal.     Pupils: Pupils are equal, round, and reactive to light.  Cardiovascular:     Rate and Rhythm: Normal rate and regular rhythm.  Pulmonary:     Effort: Pulmonary effort is normal.  Abdominal:     General: There is no distension.     Palpations: Abdomen is soft.     Tenderness: There is generalized abdominal tenderness. There is no guarding or rebound.  Musculoskeletal:     Cervical back: Neck supple.  Skin:    General: Skin is warm and dry.  Neurological:     Mental Status: She is alert and oriented to person, place, and time.     Comments: Fluent speech  Psychiatric:        Behavior: Behavior normal.        Judgment: Judgment normal.     ED Results / Procedures / Treatments   Labs (all labs ordered are listed, but only abnormal results are displayed) Labs Reviewed  COMPREHENSIVE METABOLIC PANEL - Abnormal; Notable for the following components:      Result Value   Sodium 133 (*)    Chloride 92 (*)    Glucose,  Bld 226 (*)    BUN 51 (*)    Creatinine, Ser 2.36 (*)    Total Protein 8.3 (*)    GFR calc non Af Amer 23 (*)    GFR calc Af Amer 26 (*)    Anion gap 19 (*)    All other components within normal limits  CBC WITH DIFFERENTIAL/PLATELET - Abnormal; Notable for the following components:   WBC 15.5 (*)  Neutro Abs 12.8 (*)    Monocytes Absolute 1.2 (*)    All other components within normal limits  LACTIC ACID, PLASMA - Abnormal; Notable for the following components:   Lactic Acid, Venous 3.4 (*)    All other components within normal limits  URINALYSIS, ROUTINE W REFLEX MICROSCOPIC - Abnormal; Notable for the following components:   APPearance CLOUDY (*)    Specific Gravity, Urine >1.030 (*)    Hgb urine dipstick SMALL (*)    Protein, ur 100 (*)    Leukocytes,Ua SMALL (*)    All other components within normal limits  URINALYSIS, MICROSCOPIC (REFLEX) - Abnormal; Notable for the following components:   Bacteria, UA MANY (*)    Non Squamous Epithelial PRESENT (*)    All other components within normal limits  SARS CORONAVIRUS 2 (TAT 6-24 HRS)  GI PATHOGEN PANEL BY PCR, STOOL  C DIFFICILE QUICK SCREEN W PCR REFLEX  LACTIC ACID, PLASMA  MAGNESIUM  PHOSPHORUS    EKG None  Radiology CT ABDOMEN PELVIS WO CONTRAST  Result Date: 09/15/2019 CLINICAL DATA:  Diverticulitis suspected. Abdominal pain and bloody diarrhea EXAM: CT ABDOMEN AND PELVIS WITHOUT CONTRAST TECHNIQUE: Multidetector CT imaging of the abdomen and pelvis was performed following the standard protocol without IV contrast. COMPARISON:  08/04/2009 FINDINGS: Lower chest:  Coronary calcification and trace pericardial effusion. Hepatobiliary: No focal liver abnormality.No evidence of biliary obstruction or stone. Pancreas: Unremarkable. Spleen: Unremarkable. Adrenals/Urinary Tract: Negative adrenals. No hydronephrosis or stone. Unremarkable bladder. Stomach/Bowel: Colonic wall thickening with submucosal low-density appearance  and pericolonic fat edema extending from the mid transverse colon to the distal descending colon. There is sparing of the rectum. No obstruction or small bowel inflammatory changes. Negative appendix. Vascular/Lymphatic: Atherosclerotic calcification. No mass or adenopathy. Reproductive:Hysterectomy. Other: Trace pelvic fluid.  No pneumoperitoneum Musculoskeletal: No acute abnormalities. IMPRESSION: 1. Colitis involving the distal transverse and descending colon. 2.  Aortic Atherosclerosis (ICD10-I70.0).  Coronary atherosclerosis. Electronically Signed   By: Monte Fantasia M.D.   On: 09/15/2019 08:53    Procedures Procedures (including critical care time) CRITICAL CARE Performed by: Wenda Overland Lodie Waheed   Total critical care time: 30 minutes  Critical care time was exclusive of separately billable procedures and treating other patients.  Critical care was necessary to treat or prevent imminent or life-threatening deterioration.  Critical care was time spent personally by me on the following activities: development of treatment plan with patient and/or surrogate as well as nursing, discussions with consultants, evaluation of patient's response to treatment, examination of patient, obtaining history from patient or surrogate, ordering and performing treatments and interventions, ordering and review of laboratory studies, ordering and review of radiographic studies, pulse oximetry and re-evaluation of patient's condition.  Medications Ordered in ED Medications  ciprofloxacin (CIPRO) IVPB 400 mg (has no administration in time range)  0.9 %  sodium chloride infusion (250 mLs Intravenous New Bag/Given 09/15/19 0921)  LORazepam (ATIVAN) injection 0-4 mg (has no administration in time range)    Or  LORazepam (ATIVAN) tablet 0-4 mg (has no administration in time range)  LORazepam (ATIVAN) injection 0-4 mg (has no administration in time range)    Or  LORazepam (ATIVAN) tablet 0-4 mg (has no  administration in time range)  thiamine tablet 100 mg (has no administration in time range)    Or  thiamine (B-1) injection 100 mg (has no administration in time range)  0.9 % NaCl with KCl 20 mEq/ L  infusion (has no administration in time range)  sodium  chloride 0.9 % bolus 1,000 mL (0 mLs Intravenous Stopped 09/15/19 0925)  ondansetron (ZOFRAN) injection 4 mg (4 mg Intravenous Given 09/15/19 0809)  fentaNYL (SUBLIMAZE) injection 100 mcg (100 mcg Intravenous Given 09/15/19 0809)  lactated ringers bolus 1,000 mL (0 mLs Intravenous Stopped 09/15/19 1004)  metroNIDAZOLE (FLAGYL) IVPB 500 mg (500 mg Intravenous New Bag/Given 09/15/19 0923)  pantoprazole (PROTONIX) injection 40 mg (40 mg Intravenous Given 09/15/19 1001)    ED Course  I have reviewed the triage vital signs and the nursing notes.  Pertinent labs & imaging results that were available during my care of the patient were reviewed by me and considered in my medical decision making (see chart for details).    MDM Rules/Calculators/A&P                       Ill-appearing but nontoxic on exam, afebrile, tachycardic.  Mild generalized abdominal tenderness without peritonitis.  Lab work shows WBC 15.5, normal hemoglobin, lactate 3.4, Cr 2.36, BUN 51, AG 19. I suspect AKI is 2/2 dehydration. Ordered 2nd fluid bolus. CT abd/pelvis shows colitis of transverse and descending colon.  Although I feel infectious colitis is most likely given presence of vomiting as well as diarrhea, differential also includes ischemic colitis as the patient has history of carotid stenosis and is a smoker.  Unable to get contrasted study at this time but may be able to in the future if creatinine improves with hydration and patient remains symptomatic.  Further questioning, the patient admits to daily drinking, had 5 beers on the day that her symptoms started.  She also admits to daily ibuprofen use.  I have added IV Protonix as she is at risk for upper GI bleeding  although her current presentation suggests lower GI source.  Discussed admission with [facility], Dr. Olevia Bowens, and pt will be transferred for further treatment.   Final Clinical Impression(s) / ED Diagnoses Final diagnoses:  Colitis  Bloody diarrhea  Non-intractable vomiting with nausea, unspecified vomiting type  AKI (acute kidney injury) [facility])    Rx / DC Orders ED Discharge Orders    None       Toba Claudio, Wenda Overland, MD 09/15/19 1034

## 2019-09-15 NOTE — ED Triage Notes (Signed)
Pt c/o left side abdominal pain with N/V/D x 3 days. Pt denies fever, HA. Pt denies otc meds pta. Pt also c/o bloody stool. Pt reports flu vac on Friday.

## 2019-09-15 NOTE — ED Notes (Signed)
Pt reports inability to provide stool sample at this time

## 2019-09-15 NOTE — ED Notes (Signed)
ED Provider at bedside. 

## 2019-09-15 NOTE — ED Notes (Signed)
Lactic Acid 3.4, results given to ED MD and Orlando Penner RN

## 2019-09-15 NOTE — ED Notes (Signed)
Pt aware urine specimen needed, unable to provide sample at this time.

## 2019-09-15 NOTE — ED Notes (Signed)
Patient transported to CT 

## 2019-09-15 NOTE — ED Notes (Signed)
Pt requesting pain & nausea meds, informed pt will notify EDP

## 2019-09-16 LAB — HEPATIC FUNCTION PANEL
ALT: 22 U/L (ref 0–44)
AST: 16 U/L (ref 15–41)
Albumin: 3.1 g/dL — ABNORMAL LOW (ref 3.5–5.0)
Alkaline Phosphatase: 44 U/L (ref 38–126)
Bilirubin, Direct: 0.2 mg/dL (ref 0.0–0.2)
Indirect Bilirubin: 0.8 mg/dL (ref 0.3–0.9)
Total Bilirubin: 1 mg/dL (ref 0.3–1.2)
Total Protein: 6 g/dL — ABNORMAL LOW (ref 6.5–8.1)

## 2019-09-16 LAB — BASIC METABOLIC PANEL
Anion gap: 10 (ref 5–15)
BUN: 14 mg/dL (ref 6–20)
CO2: 23 mmol/L (ref 22–32)
Calcium: 8.1 mg/dL — ABNORMAL LOW (ref 8.9–10.3)
Chloride: 104 mmol/L (ref 98–111)
Creatinine, Ser: 0.52 mg/dL (ref 0.44–1.00)
GFR calc Af Amer: >60 mL/min (ref >60)
GFR calc non Af Amer: >60 mL/min (ref >60)
Glucose, Bld: 118 mg/dL — ABNORMAL HIGH (ref 70–99)
Potassium: 3.3 mmol/L — ABNORMAL LOW (ref 3.5–5.1)
Sodium: 137 mmol/L (ref 135–145)

## 2019-09-16 LAB — C DIFFICILE QUICK SCREEN W PCR REFLEX
C Diff antigen: NEGATIVE
C Diff interpretation: NOT DETECTED
C Diff toxin: NEGATIVE

## 2019-09-16 LAB — CBC
HCT: 32.6 % — ABNORMAL LOW (ref 36.0–46.0)
HCT: 33.2 % — ABNORMAL LOW (ref 36.0–46.0)
Hemoglobin: 10.6 g/dL — ABNORMAL LOW (ref 12.0–15.0)
Hemoglobin: 10.9 g/dL — ABNORMAL LOW (ref 12.0–15.0)
MCH: 32.2 pg (ref 26.0–34.0)
MCH: 32.5 pg (ref 26.0–34.0)
MCHC: 32.5 g/dL (ref 30.0–36.0)
MCHC: 32.8 g/dL (ref 30.0–36.0)
MCV: 99.1 fL (ref 80.0–100.0)
MCV: 99.1 fL (ref 80.0–100.0)
Platelets: 168 10*3/uL (ref 150–400)
Platelets: 180 10*3/uL (ref 150–400)
RBC: 3.29 MIL/uL — ABNORMAL LOW (ref 3.87–5.11)
RBC: 3.35 MIL/uL — ABNORMAL LOW (ref 3.87–5.11)
RDW: 12.6 % (ref 11.5–15.5)
RDW: 12.5 % (ref 11.5–15.5)
WBC: 11 10*3/uL — ABNORMAL HIGH (ref 4.0–10.5)
WBC: 11.3 10*3/uL — ABNORMAL HIGH (ref 4.0–10.5)
nRBC: 0 % (ref 0.0–0.2)
nRBC: 0 % (ref 0.0–0.2)

## 2019-09-16 LAB — HIV ANTIBODY (ROUTINE TESTING W REFLEX): HIV Screen 4th Generation wRfx: NONREACTIVE

## 2019-09-16 LAB — ABO/RH: ABO/RH(D): A POS

## 2019-09-16 LAB — TYPE AND SCREEN
ABO/RH(D): A POS
Antibody Screen: NEGATIVE

## 2019-09-16 LAB — GLUCOSE, CAPILLARY
Glucose-Capillary: 101 mg/dL — ABNORMAL HIGH (ref 70–99)
Glucose-Capillary: 98 mg/dL (ref 70–99)
Glucose-Capillary: 123 mg/dL — ABNORMAL HIGH (ref 70–99)
Glucose-Capillary: 73 mg/dL (ref 70–99)
Glucose-Capillary: 118 mg/dL — ABNORMAL HIGH (ref 70–99)

## 2019-09-16 MED ORDER — HYDRALAZINE HCL 20 MG/ML IJ SOLN: 10.0000 mg | INTRAMUSCULAR | Status: DC | PRN

## 2019-09-16 MED ORDER — CIPROFLOXACIN IN D5W 400 MG/200ML IV SOLN
400.0000 mg | INTRAVENOUS | Status: DC
  Filled 2019-09-16: qty 200

## 2019-09-16 MED ORDER — SODIUM CHLORIDE 0.9 % IV SOLN: INTRAVENOUS | Status: DC

## 2019-09-16 MED ORDER — INSULIN ASPART 100 UNIT/ML ~~LOC~~ SOLN: 0.0000 [IU] | SUBCUTANEOUS | Status: DC

## 2019-09-16 MED ORDER — INSULIN ASPART 100 UNIT/ML ~~LOC~~ SOLN
0.0000 [IU] | SUBCUTANEOUS | Status: DC
  Administered 2019-09-16: 1 [IU] via SUBCUTANEOUS

## 2019-09-16 MED ORDER — CIPROFLOXACIN IN D5W 400 MG/200ML IV SOLN
400.0000 mg | Freq: Two times a day (BID) | INTRAVENOUS | Status: DC
  Administered 2019-09-16 – 2019-09-17 (×3): 400 mg via INTRAVENOUS
  Filled 2019-09-16 (×2): qty 200

## 2019-09-16 NOTE — Progress Notes (Signed)
Pharmacy Antibiotic Note  [name] is a 53 y.o. female admitted on 09/15/2019 with Intra-abdominal infection.  Pharmacy has been consulted for ciprofloxacin dosing. Pt on ciprofloxacin + metronidazole.  Today, 09/16/19 -WBC 11 -SCr 0.5, CrCl ~ 64 mL/min. Significant improvement in renal function -Afebrile  Plan:  Increase ciprofloxacin to 400 mg IV q12h given improvement in renal function  Follow renal function and culture data  Height: 5\' 2"  (157.5 cm) Weight: 121 lb 0.5 oz (54.9 kg) IBW/kg (Calculated) : 50.1  Temp (24hrs), Avg:98.7 F (37.1 C), Min:98.2 F (36.8 C), Max:99.1 F (37.3 C)  Recent Labs  Lab 09/15/19 0735 09/15/19 1100 09/15/19 2258 09/16/19 0512  WBC 15.5*  --  12.5* 11.0*  CREATININE 2.36*  --   --  0.52  LATICACIDVEN 3.4* 1.1  --   --     Estimated Creatinine Clearance: 64.3 mL/min (by C-G formula based on SCr of 0.52 mg/dL).    No Known Allergies  Antimicrobials this admission: Ciprofloxacin 09/16/2019 >> Flagyl 09/16/2019 >>   Dose adjustments this admission: 12/16 cipro 400 mg IV q24h --> cipro 400 mg IV q12h  Microbiology results: 12/15 C.diff: Negative 12/15 SARS-2: Negative  Thank you for allowing pharmacy to be a part of this patient's care.  Lenis Noon, PharmD 09/16/2019 9:49 AM

## 2019-09-16 NOTE — Progress Notes (Signed)
Pharmacy Antibiotic Note  [name] is a 53 y.o. female admitted on 09/15/2019 with Intra-abdominal infection.  Pharmacy has been consulted for Ciprofloxacin dosing.  Plan: Ciprofloxacin 400mg  iv q24hr  Height: 5\' 2"  (157.5 cm) Weight: 121 lb 0.5 oz (54.9 kg) IBW/kg (Calculated) : 50.1  Temp (24hrs), Avg:98.5 F (36.9 C), Min:98.1 F (36.7 C), Max:99.1 F (37.3 C)  Recent Labs  Lab 09/15/19 0735 09/15/19 1100 09/15/19 2258  WBC 15.5*  --  12.5*  CREATININE 2.36*  --   --   LATICACIDVEN 3.4* 1.1  --     Estimated Creatinine Clearance: 21.8 mL/min (A) (by C-G formula based on SCr of 2.36 mg/dL (H)).    No Known Allergies  Antimicrobials this admission: Ciprofloxacin 09/16/2019 >> Flagyl 09/16/2019 >>   Dose adjustments this admission: -  Microbiology results: -  Thank you for allowing pharmacy to be a part of this patient's care.  [name] 09/16/2019 4:51 AM

## 2019-09-16 NOTE — Progress Notes (Signed)
PROGRESS NOTE  [name]  DOB: 10/06/1965  PCP: Antony Blackbird, MD UF:[id number]  DOA: 09/15/2019  LOS: 1 day   Chief Complaint  Patient presents with  . Abdominal Pain   Brief narrative: [name] is a 53 y.o. female with history of diabetes mellitus type 2, hypertension, hyperlipidemia, tobacco and alcohol abuse. She presented to the ED at [facility] on 12/15 with complaints of persistent diarrhea with specks of blood as well as nausea, vomiting and abdominal pain for 2 days. Abdominal pain is mostly colicky in nature lower abdominal quadrant mostly in the left side.  In the ED, patient had a temperature of 99.1, heart rate 120. Work-up showed WBC count elevated to 15.5, lactic acid level elevated to 3.4, creatinine elevated to 2.3, baseline 0.54 two weeks ago. CT scan of abdomen and pelvis showed colitis involving the distal transverse colon and descending colon.   Patient was admitted to hospitalist service for further evaluation and management.  Subjective: Patient was seen and examined this morning.  Pleasant middle-aged Caucasian female.  Not in distress.  No vomiting since presentation.  Diarrhea has slowed down and stool became more soft this morning.  Assessment/Plan: Acute colitis involving the distal transverse and descending colon -Infectious versus ischemic versus inflammatory -Treatment was started for possible infectious nature.  Currently on IV ciprofloxacin and IV Flagyl.  Patient is starting to improve.  Continue IV fluid.  GI pathogen panel pending.  Patient has not had colonoscopy yet.  Recommend screening colonoscopy as an outpatient.  Patient reported few specks of blood at home which is likely due to colitis itself.  If continues to have BRBPR, will get inpatient GI consultation.  Type 2 diabetes mellitus  -On Lantus 15 units daily at home. -Currently on sliding scale insulin because of poor oral intake. -As diet improves, patient needs long-acting  insulin coverage as well.  Hypertension -Home meds include amlodipine, Coreg, lisinopril, HCTZ. -Continue amlodipine and Coreg.  HCTZ and lisinopril on hold.  AKI -creatinine elevated to 2.3, baseline 0.54 two weeks ago. -Secondary to dehydration.  Improving creatinine with fluids.  Continue to monitor.  EtOH abuse with possible withdrawal symptoms  -on CIWA protocol.  Advised about quitting.  Mobility: Encourage ambulation Diet: Regular diet Fluid: Normal saline at 75 mils per hour DVT prophylaxis:  SCDs Code Status:  Full code Family Communication:  None at bedside Expected Discharge:  Hopefully home in 1 to 2 days  Consultants:    Procedures:    Antimicrobials: Anti-infectives (From admission, onward)   Start     Dose/Rate Route Frequency Ordered Stop   09/16/19 1000  ciprofloxacin (CIPRO) IVPB 400 mg  Status:  Discontinued     400 mg 200 mL/hr over 60 Minutes Intravenous Every 24 hours 09/16/19 0450 09/16/19 0954   09/16/19 1000  ciprofloxacin (CIPRO) IVPB 400 mg     400 mg 200 mL/hr over 60 Minutes Intravenous Every 12 hours 09/16/19 0954     09/15/19 1700  metroNIDAZOLE (FLAGYL) IVPB 500 mg     500 mg 100 mL/hr over 60 Minutes Intravenous Every 8 hours 09/15/19 1037     09/15/19 0915  ciprofloxacin (CIPRO) IVPB 400 mg     400 mg 200 mL/hr over 60 Minutes Intravenous  Once 09/15/19 0900 09/15/19 1241   09/15/19 0915  metroNIDAZOLE (FLAGYL) IVPB 500 mg     500 mg 100 mL/hr over 60 Minutes Intravenous  Once 09/15/19 0900 09/15/19 1045        Code  Status: Full Code   Diet Order            Diet regular Room service appropriate? Yes; Fluid consistency: Thin  Diet effective now              Infusions:  . 0.9 % NaCl with KCl 20 mEq / L 125 mL/hr at 09/16/19 0745  . ciprofloxacin 400 mg (09/16/19 1013)  . metronidazole 500 mg (09/16/19 1705)    Scheduled Meds: . amLODipine  10 mg Oral QHS  . carvedilol  3.125 mg Oral BID WC  . folic acid  1 mg Oral  Daily  . insulin aspart  0-9 Units Subcutaneous Q4H  . LORazepam  0-4 mg Intravenous Q6H   Followed by  . [START ON 09/18/2019] LORazepam  0-4 mg Intravenous Q12H  . multivitamin with minerals  1 tablet Oral Daily  . rosuvastatin  10 mg Oral Daily  . thiamine  100 mg Oral Daily   Or  . thiamine  100 mg Intravenous Daily  . traZODone  50 mg Oral QHS    PRN meds: acetaminophen **OR** acetaminophen, fentaNYL (SUBLIMAZE) injection, hydrALAZINE, LORazepam **OR** LORazepam, ondansetron **OR** ondansetron (ZOFRAN) IV   Objective: Vitals:   09/16/19 0442 09/16/19 1601  BP: 110/70 120/75  Pulse: 97 81  Resp:    Temp: 99.1 F (37.3 C) 98.7 F (37.1 C)  SpO2: 94% 96%    Intake/Output Summary (Last 24 hours) at 09/16/2019 1745 Last data filed at 09/16/2019 1500 Gross per 24 hour  Intake 3831.54 ml  Output --  Net 3831.54 ml   Filed Weights   09/15/19 0719 09/15/19 2106  Weight: 56.7 kg 54.9 kg   Weight change:  Body mass index is 22.14 kg/m.   Physical Exam: General exam: Appears calm and comfortable.  Skin: No rashes, lesions or ulcers. HEENT: Atraumatic, normocephalic, supple neck, no obvious bleeding Lungs: Clear to auscultation bilaterally CVS: Regular rate and rhythm, no murmur GI/Abd mild diffuse tenderness, bowel sound present CNS: Alert, awake, oriented x3 Psychiatry: Mood appropriate Extremities: No pedal edema, no calf tenderness  Data Review: I have personally reviewed the laboratory data and studies available.  Recent Labs  Lab 09/15/19 0735 09/15/19 2258 09/16/19 0512 09/16/19 1152  WBC 15.5* 12.5* 11.0* 11.3*  NEUTROABS 12.8*  --   --   --   HGB 14.7 11.9* 10.6* 10.9*  HCT 42.9 36.0 32.6* 33.2*  MCV 94.9 99.7 99.1 99.1  PLT 276 185 168 180   Recent Labs  Lab 09/15/19 0735 09/16/19 0512  NA 133* 137  K 3.7 3.3*  CL 92* 104  CO2 22 23  GLUCOSE 226* 118*  BUN 51* 14  CREATININE 2.36* 0.52  CALCIUM 9.6 8.1*  MG 2.3  --   PHOS 5.2*  --      Terrilee Croak, MD  [facility] 09/16/2019

## 2019-09-17 DIAGNOSIS — R197 Diarrhea, unspecified: Secondary | ICD-10-CM

## 2019-09-17 LAB — CBC WITH DIFFERENTIAL/PLATELET
Abs Immature Granulocytes: 0.08 10*3/uL — ABNORMAL HIGH (ref 0.00–0.07)
Basophils Absolute: 0.1 10*3/uL (ref 0.0–0.1)
Basophils Relative: 0 %
Eosinophils Absolute: 0.2 10*3/uL (ref 0.0–0.5)
Eosinophils Relative: 1 %
HCT: 33.3 % — ABNORMAL LOW (ref 36.0–46.0)
Hemoglobin: 11.1 g/dL — ABNORMAL LOW (ref 12.0–15.0)
Immature Granulocytes: 1 %
Lymphocytes Relative: 19 %
Lymphs Abs: 2.2 10*3/uL (ref 0.7–4.0)
MCH: 33 pg (ref 26.0–34.0)
MCHC: 33.3 g/dL (ref 30.0–36.0)
MCV: 99.1 fL (ref 80.0–100.0)
Monocytes Absolute: 1 10*3/uL (ref 0.1–1.0)
Monocytes Relative: 8 %
Neutro Abs: 8.2 10*3/uL — ABNORMAL HIGH (ref 1.7–7.7)
Neutrophils Relative %: 71 %
Platelets: 197 10*3/uL (ref 150–400)
RBC: 3.36 MIL/uL — ABNORMAL LOW (ref 3.87–5.11)
RDW: 12.2 % (ref 11.5–15.5)
WBC: 11.6 10*3/uL — ABNORMAL HIGH (ref 4.0–10.5)
nRBC: 0 % (ref 0.0–0.2)

## 2019-09-17 LAB — COMPREHENSIVE METABOLIC PANEL
ALT: 20 U/L (ref 0–44)
AST: 14 U/L — ABNORMAL LOW (ref 15–41)
Albumin: 3.2 g/dL — ABNORMAL LOW (ref 3.5–5.0)
Alkaline Phosphatase: 50 U/L (ref 38–126)
Anion gap: 12 (ref 5–15)
BUN: 6 mg/dL (ref 6–20)
CO2: 25 mmol/L (ref 22–32)
Calcium: 8.2 mg/dL — ABNORMAL LOW (ref 8.9–10.3)
Chloride: 100 mmol/L (ref 98–111)
Creatinine, Ser: 0.5 mg/dL (ref 0.44–1.00)
GFR calc Af Amer: >60 mL/min (ref >60)
GFR calc non Af Amer: >60 mL/min (ref >60)
Glucose, Bld: 125 mg/dL — ABNORMAL HIGH (ref 70–99)
Potassium: 3.1 mmol/L — ABNORMAL LOW (ref 3.5–5.1)
Sodium: 137 mmol/L (ref 135–145)
Total Bilirubin: 0.8 mg/dL (ref 0.3–1.2)
Total Protein: 6.3 g/dL — ABNORMAL LOW (ref 6.5–8.1)

## 2019-09-17 LAB — PHOSPHORUS: Phosphorus: 2 mg/dL — ABNORMAL LOW (ref 2.5–4.6)

## 2019-09-17 LAB — GLUCOSE, CAPILLARY
Glucose-Capillary: 106 mg/dL — ABNORMAL HIGH (ref 70–99)
Glucose-Capillary: 120 mg/dL — ABNORMAL HIGH (ref 70–99)
Glucose-Capillary: 103 mg/dL — ABNORMAL HIGH (ref 70–99)
Glucose-Capillary: 131 mg/dL — ABNORMAL HIGH (ref 70–99)

## 2019-09-17 LAB — MAGNESIUM: Magnesium: 1.7 mg/dL (ref 1.7–2.4)

## 2019-09-17 MED ORDER — TRAMADOL HCL 50 MG PO TABS: 50.0000 mg | ORAL_TABLET | Freq: Three times a day (TID) | ORAL | 0 refills | Status: DC | PRN

## 2019-09-17 MED ORDER — METRONIDAZOLE 500 MG PO TABS: 500.0000 mg | ORAL_TABLET | Freq: Three times a day (TID) | ORAL | 0 refills | Status: AC

## 2019-09-17 MED ORDER — CIPROFLOXACIN HCL 500 MG PO TABS: 500.0000 mg | ORAL_TABLET | Freq: Two times a day (BID) | ORAL | 0 refills | Status: AC

## 2019-09-17 MED ORDER — POTASSIUM PHOSPHATES 15 MMOLE/5ML IV SOLN
30.0000 mmol | Freq: Once | INTRAVENOUS | Status: AC
  Administered 2019-09-17: 30 mmol via INTRAVENOUS
  Filled 2019-09-17: qty 10

## 2019-09-17 MED ORDER — HYDROCHLOROTHIAZIDE 25 MG PO TABS: 25.0000 mg | ORAL_TABLET | Freq: Every day | ORAL | 1 refills | Status: DC

## 2019-09-17 MED ORDER — ONDANSETRON 4 MG PO TBDP: 4.0000 mg | ORAL_TABLET | Freq: Three times a day (TID) | ORAL | 0 refills | Status: DC | PRN

## 2019-09-17 MED ORDER — LOPERAMIDE HCL 2 MG PO CAPS: 2.0000 mg | ORAL_CAPSULE | ORAL | 0 refills | Status: DC | PRN

## 2019-09-17 MED ORDER — LISINOPRIL 40 MG PO TABS: 40.0000 mg | ORAL_TABLET | Freq: Every day | ORAL | 1 refills | Status: DC

## 2019-09-17 NOTE — Discharge Summary (Signed)
Physician Discharge Summary   Patient ID: [name] MRN: [id number] DOB/AGE: [birth year] 53 y.o.  Admit date: 09/15/2019 Discharge date: 09/17/2019  Primary Care Physician:  Antony Blackbird, MD   Recommendations for Outpatient Follow-up:  1. Follow up with PCP in 1-2 weeks 2. Patient is recommended to follow-up with PCP regarding screening colonoscopy in 4 to 6-weeks after acute colitis has fully resolved  Home Health: None  Equipment/Devices:   Discharge Condition: stable CODE STATUS: FULL  Diet recommendation: Soft easy to digest bland food for next few days   Discharge Diagnoses:    . Acute infectious colitis . SIRS (systemic inflammatory response syndrome) (HCC) . HTN (hypertension), malignant . ETOH abuse . Diabetes type 2, uncontrolled ([place])   Consults: None    Allergies:  No Known Allergies   DISCHARGE MEDICATIONS: Allergies as of 09/17/2019   No Known Allergies     Medication List    TAKE these medications   amLODipine 10 MG tablet Commonly known as: NORVASC Take 1 tablet (10 mg total) by mouth at bedtime. To lower blood pressure   B-D ULTRAFINE III SHORT PEN 31G X 8 MM Misc Generic drug: Insulin Pen Needle 1 application by Does not apply route daily.   carvedilol 3.125 MG tablet Commonly known as: COREG Take 1 tablet (3.125 mg total) by mouth 2 (two) times daily with a meal. To lower blood pressure   ciprofloxacin 500 MG tablet Commonly known as: CIPRO Take 1 tablet (500 mg total) by mouth 2 (two) times daily for 7 days.   glucose blood test strip Commonly known as: True Metrix Blood Glucose Test 1 each by Other route 3 (three) times daily.   hydrochlorothiazide 25 MG tablet Commonly known as: HYDRODIURIL Take 1 tablet (25 mg total) by mouth daily. To lower blood pressure Start taking on: September 19, 2019 What changed: These instructions start on September 19, 2019. If you are unsure what to do until then, ask your doctor or other care  provider.   lisinopril 40 MG tablet Commonly known as: ZESTRIL Take 1 tablet (40 mg total) by mouth daily. To lower blood pressure Start taking on: September 19, 2019 What changed: These instructions start on September 19, 2019. If you are unsure what to do until then, ask your doctor or other care provider.   loperamide 2 MG capsule Commonly known as: IMODIUM Take 1 capsule (2 mg total) by mouth as needed for diarrhea or loose stools (available over the counter).   MELATONIN PO Take 1 tablet by mouth at bedtime as needed (sleep).   metroNIDAZOLE 500 MG tablet Commonly known as: Flagyl Take 1 tablet (500 mg total) by mouth 3 (three) times daily for 7 days.   ondansetron 4 MG disintegrating tablet Commonly known as: Zofran ODT Take 1 tablet (4 mg total) by mouth every 8 (eight) hours as needed for nausea or vomiting.   rosuvastatin 10 MG tablet Commonly known as: Crestor Take 2 tablets (20 mg total) by mouth daily. To lower cholesterol What changed:   how much to take  additional instructions   Toujeo SoloStar 300 UNIT/ML Sopn Generic drug: Insulin Glargine (1 Unit Dial) Inject 15 Units into the skin daily.   traMADol 50 MG tablet Commonly known as: Ultram Take 1 tablet (50 mg total) by mouth every 8 (eight) hours as needed for moderate pain or severe pain.   traZODone 50 MG tablet Commonly known as: DESYREL Take 1/2-1 tablet by mouth at bedtime prn sleep. What changed:  how much to take  how to take this  when to take this  additional instructions   True Metrix Meter w/Device Kit 1 each by Does not apply route as needed.   TRUEplus Lancets 28G Misc 1 each by Does not apply route 3 (three) times daily.        Brief H and P: For complete details please refer to admission H and P, but in brief [name] a 53 y.o.femalewithhistory of diabetes mellitus type 2, hypertension, hyperlipidemia, tobacco and alcohol abuse. She presented to the ED at [facility] on 12/15 with complaints of persistent diarrhea with specks of blood as well as nausea, vomiting and abdominal pain for 2 days. Abdominal pain is mostly colicky in nature lower abdominal quadrant mostly in the left side.  In the ED, patient had a temperature of 99.1, heart rate 120. Work-up showed WBC count elevated to 15.5, lactic acid level elevated to 3.4, creatinine elevated to 2.3, baseline 0.54 two weeks ago. CT scan of abdomen and pelvis showed colitis involving the distal transverse colon and descending colon.  Patient was admitted to hospitalist service for further evaluation and management.  Hospital Course:  Principal Problem: Sepsis secondary to acute infectious colitis involving the distal transverse and descending colon -Patient presented with nausea vomiting, abdominal pain for 2 days with bloody diarrhea -CT of the abdomen and pelvis showed colitis involving the distal transverse and descending colon -Patient met sepsis criteria at the time of admission with fever, tachycardia, acute kidney injury, lactic acidosis 3.4, leukocytosis secondary to acute colitis -COVID-19 negative -Patient was started on IV fluids, IV ciprofloxacin and Flagyl. -C. difficile negative, GI pathogen panel still pending -Symptoms much improved, recent BM with no blood.  Patient is now tolerating solid diet -Transition to oral ciprofloxacin and Flagyl for 7 days, recommended patient to continue soft solids.  Follow-up with PCP and recommended to have screening colonoscopy outpatient  Active Problems:  Acute kidney injury with lactic acidosis -Likely due to #1, creatinine was elevated to 2.36 at the time of admission, baseline 0.54 two weeks ago -Patient was placed on IV fluid hydration, creatinine has improved to 0.5 at the time of discharge  Hypokalemia, hypophosphatemia -Potassium 3.1, phosphorus 2.0, patient was given IV K-Phos prior to discharge  History of alcohol use  -Patient was  placed on Ativan with CIWA protocol -Currently stable, not in acute withdrawals  Diabetes mellitus type 2, IDDM, uncontrolled with hyperglycemia -At the time of admission, anion gap 19, glucose 226 -Patient was placed on sliding scale insulin while inpatient, continue insulin regimen outpatient, patient is Toujeo 15 units daily.  Patient recommended to follow-up with PCP for further adjustment in insulin regimen  Hypertension HCTZ and lisinopril were placed on hold while inpatient. Patient was continued on amlodipine and Coreg Patient was recommended to hold lisinopril and HCTZ for another 2 days, stay hydrated    Day of Discharge S: Doing much better today, tolerating solid diet.  States no blood noticed in the stool.  BP 102/65 (BP Location: Right Arm)   Pulse 83   Temp 98.5 F (36.9 C)   Resp 15   Ht '5\' 2"'$  (1.575 m)   Wt 54.9 kg   SpO2 97%   BMI 22.14 kg/m   Physical Exam: General: Alert and awake oriented x3 not in any acute distress. HEENT: anicteric sclera, pupils reactive to light and accommodation CVS: S1-S2 clear no murmur rubs or gallops Chest: clear to auscultation bilaterally, no wheezing rales  or rhonchi Abdomen: soft nontender, nondistended, normal bowel sounds Extremities: no cyanosis, clubbing or edema noted bilaterally Neuro: Cranial nerves II-XII intact, no focal neurological deficits   The results of significant diagnostics from this hospitalization (including imaging, microbiology, ancillary and laboratory) are listed below for reference.      Procedures/Studies:  CT ABDOMEN PELVIS WO CONTRAST  Result Date: 09/15/2019 CLINICAL DATA:  Diverticulitis suspected. Abdominal pain and bloody diarrhea EXAM: CT ABDOMEN AND PELVIS WITHOUT CONTRAST TECHNIQUE: Multidetector CT imaging of the abdomen and pelvis was performed following the standard protocol without IV contrast. COMPARISON:  08/04/2009 FINDINGS: Lower chest:  Coronary calcification and trace  pericardial effusion. Hepatobiliary: No focal liver abnormality.No evidence of biliary obstruction or stone. Pancreas: Unremarkable. Spleen: Unremarkable. Adrenals/Urinary Tract: Negative adrenals. No hydronephrosis or stone. Unremarkable bladder. Stomach/Bowel: Colonic wall thickening with submucosal low-density appearance and pericolonic fat edema extending from the mid transverse colon to the distal descending colon. There is sparing of the rectum. No obstruction or small bowel inflammatory changes. Negative appendix. Vascular/Lymphatic: Atherosclerotic calcification. No mass or adenopathy. Reproductive:Hysterectomy. Other: Trace pelvic fluid.  No pneumoperitoneum Musculoskeletal: No acute abnormalities. IMPRESSION: 1. Colitis involving the distal transverse and descending colon. 2.  Aortic Atherosclerosis (ICD10-I70.0).  Coronary atherosclerosis. Electronically Signed   By: Monte Fantasia M.D.   On: 09/15/2019 08:53      LAB RESULTS: Basic Metabolic Panel: Recent Labs  Lab 09/16/19 0512 09/17/19 0544  NA 137 137  K 3.3* 3.1*  CL 104 100  CO2 23 25  GLUCOSE 118* 125*  BUN 14 6  CREATININE 0.52 0.50  CALCIUM 8.1* 8.2*  MG  --  1.7  PHOS  --  2.0*   Liver Function Tests: Recent Labs  Lab 09/16/19 0512 09/17/19 0544  AST 16 14*  ALT 22 20  ALKPHOS 44 50  BILITOT 1.0 0.8  PROT 6.0* 6.3*  ALBUMIN 3.1* 3.2*   No results for input(s): LIPASE, AMYLASE in the last 168 hours. No results for input(s): AMMONIA in the last 168 hours. CBC: Recent Labs  Lab 09/16/19 1152 09/17/19 0544  WBC 11.3* 11.6*  NEUTROABS  --  8.2*  HGB 10.9* 11.1*  HCT 33.2* 33.3*  MCV 99.1 99.1  PLT 180 197   Cardiac Enzymes: No results for input(s): CKTOTAL, CKMB, CKMBINDEX, TROPONINI in the last 168 hours. BNP: Invalid input(s): POCBNP CBG: Recent Labs  Lab 09/17/19 0752 09/17/19 1149  GLUCAP 103* 131*      Disposition and Follow-up: Discharge Instructions    Diet - low sodium heart  healthy   Complete by: As directed    Discharge instructions   Complete by: As directed    Please eat soft bland healthy diet for this week. Stay hydrated. Discuss with your primary doctor about scheduling screening colonoscopy.  Please hold lisinopril and HCTZ for 2 days.  Restart if BP consistently above 140s.   Increase activity slowly   Complete by: As directed        DISPOSITION: Home   DISCHARGE FOLLOW-UP Follow-up Information    Fulp, Cammie, MD. Schedule an appointment as soon as possible for a visit in 10 day(s).   Specialty: Family Medicine Contact information: [place] [address] [phone]            Time coordinating discharge:  35 minutes  Signed:   Estill Cotta M.D. [facility] 09/17/2019, 12:39 PM

## 2019-09-17 NOTE — TOC Transition Note (Signed)
Transition of Care [facility]) - CM/SW Discharge Note   Patient Details  Name: [name] MRN: [id number] Date of Birth: 10/04/1965  [facility] CM/SW Contact:  Dessa Phi, RN Phone Number: 09/17/2019, 9:57 AM   Clinical Narrative:   CM referral for SA counseling-Patient declines alcohol resources or counseling-she says she knows what she has to do. No further CM needs.    Final next level of care: Home/Self Care Barriers to Discharge: No Barriers Identified   Patient Goals and CMS Choice        Discharge Placement                       Discharge Plan and Services                                     Social Determinants of Health (SDOH) Interventions     Readmission Risk Interventions No flowsheet data found.

## 2019-09-17 NOTE — Progress Notes (Signed)
Pharmacy Antibiotic Note  [name] is a 53 y.o. female admitted on 09/15/2019 with Intra-abdominal infection.  Pharmacy has been consulted for ciprofloxacin dosing. Pt on ciprofloxacin + metronidazole.  Today, 09/17/19 -WBC 11.6 -SCr 0.5, CrCl ~ 64 mL/min. Stable compared to yesterday. -Afebrile  Plan:  Continue ciprofloxacin 400 mg IV q12h   Renal function stable, no dose adjustment needed. Pharmacy to sign off. Please re-consult if needed.   Height: 5\' 2"  (157.5 cm) Weight: 121 lb 0.5 oz (54.9 kg) IBW/kg (Calculated) : 50.1  Temp (24hrs), Avg:98.4 F (36.9 C), Min:98.1 F (36.7 C), Max:98.7 F (37.1 C)  Recent Labs  Lab 09/15/19 0735 09/15/19 1100 09/15/19 2258 09/16/19 0512 09/16/19 1152 09/17/19 0544  WBC 15.5*  --  12.5* 11.0* 11.3* 11.6*  CREATININE 2.36*  --   --  0.52  --  0.50  LATICACIDVEN 3.4* 1.1  --   --   --   --     Estimated Creatinine Clearance: 64.3 mL/min (by C-G formula based on SCr of 0.5 mg/dL).    No Known Allergies  Antimicrobials this admission: Ciprofloxacin 09/17/2019 >> Flagyl 09/17/2019 >>   Dose adjustments this admission: 12/16 cipro 400 mg IV q24h --> cipro 400 mg IV q12h  Microbiology results: 12/15 C.diff: Negative 12/15 SARS-2: Negative  Thank you for allowing pharmacy to be a part of this patient's care.  Lenis Noon, PharmD 09/17/2019 12:04 PM

## 2019-09-19 ENCOUNTER — Encounter: Payer: Self-pay | Admitting: Family Medicine

## 2019-09-19 DIAGNOSIS — E119 Type 2 diabetes mellitus without complications: Secondary | ICD-10-CM | POA: Insufficient documentation

## 2019-09-19 DIAGNOSIS — Z794 Long term (current) use of insulin: Secondary | ICD-10-CM | POA: Insufficient documentation

## 2019-09-19 DIAGNOSIS — I6522 Occlusion and stenosis of left carotid artery: Secondary | ICD-10-CM | POA: Insufficient documentation

## 2019-09-19 MED ORDER — ROSUVASTATIN CALCIUM 20 MG PO TABS: 20.0000 mg | ORAL_TABLET | Freq: Every day | ORAL | 3 refills | Status: DC

## 2019-09-20 LAB — GI PATHOGEN PANEL BY PCR, STOOL

## 2019-09-21 MED FILL — ?ROSUVASTATIN CALCIUM 20MG: 20 | 30 days supply | Qty: 30 | Fill #0

## 2019-09-30 ENCOUNTER — Other Ambulatory Visit: Payer: Self-pay | Admitting: Family Medicine

## 2019-09-30 DIAGNOSIS — I10 Essential (primary) hypertension: Secondary | ICD-10-CM

## 2019-09-30 MED FILL — TOUJEO SOLOSTAR 300 UNITS/M: 300 | 30 days supply | Qty: 2 | Fill #0

## 2019-09-30 MED FILL — ?CARVEDILOL 3.125 MG TABLET: 3.125 | 30 days supply | Qty: 60 | Fill #1

## 2019-09-30 MED FILL — ?HYDROCHLOROTHIAZIDE 25MG T: 25 | 30 days supply | Qty: 30 | Fill #0

## 2019-09-30 MED FILL — ?AMLODIPINE BESYLATE 10 MG: 10 | 30 days supply | Qty: 30 | Fill #1

## 2019-09-30 MED FILL — ?ROSUVASTATIN CALCIUM 10 MG: 10 | 30 days supply | Qty: 60 | Fill #1

## 2019-09-30 MED FILL — ?traZODONE HCL 50MG TAB: 50 | 30 days supply | Qty: 30 | Fill #1

## 2019-09-30 MED FILL — LISINOPRIL 40 MG TABLET: 40 | 30 days supply | Qty: 30 | Fill #0

## 2019-10-26 MED FILL — ?ROSUVASTATIN CALCIUM 10 MG: 10 | 30 days supply | Qty: 60 | Fill #2

## 2019-10-26 MED FILL — traZODone HCL 50 MG TABS: 50 | 30 days supply | Qty: 30 | Fill #2

## 2019-10-26 MED FILL — LISINOPRIL 40 MG TABLET: 40 | 30 days supply | Qty: 30 | Fill #1

## 2019-10-26 MED FILL — AMLODIPINE BESYLATE 10 MG T: 10 | 30 days supply | Qty: 30 | Fill #2

## 2019-10-26 MED FILL — ?CARVEDILOL 3.125 MG TABLET: 3.125 | 30 days supply | Qty: 60 | Fill #2

## 2019-10-26 MED FILL — TOUJEO SOLOSTAR 300 UNITS/M: 300 | 30 days supply | Qty: 2 | Fill #1

## 2019-10-26 MED FILL — ?HYDROCHLOROTHIAZIDE 25MG T: 25 | 30 days supply | Qty: 30 | Fill #1

## 2019-11-25 ENCOUNTER — Other Ambulatory Visit: Payer: Self-pay | Admitting: Family Medicine

## 2019-11-25 DIAGNOSIS — E782 Mixed hyperlipidemia: Secondary | ICD-10-CM

## 2019-11-25 DIAGNOSIS — E119 Type 2 diabetes mellitus without complications: Secondary | ICD-10-CM

## 2019-11-25 MED FILL — ?HYDROCHLOROTHIAZIDE 25MG T: 25 | 30 days supply | Qty: 30 | Fill #2

## 2019-11-25 MED FILL — ?CARVEDILOL 3.125 MG TABLET: 3.125 | 30 days supply | Qty: 60 | Fill #3

## 2019-11-25 MED FILL — ?TRAZODONE HCL 50 TABS: 50 | 30 days supply | Qty: 30 | Fill #3

## 2019-11-25 MED FILL — ?AMLODIPINE BESYL 10MG TABL: 10 | 30 days supply | Qty: 30 | Fill #3

## 2019-11-25 MED FILL — LISINOPRIL 40 MG TABLET: 40 | 30 days supply | Qty: 30 | Fill #2

## 2019-11-25 MED FILL — $TOUJEO SOLOSTAR 300 UNITS/: 300 | 30 days supply | Qty: 2 | Fill #2

## 2019-11-26 MED FILL — ?ROSUVASTATIN CALCIUM 10 MG: 10 | 30 days supply | Qty: 60 | Fill #0

## 2019-12-26 ENCOUNTER — Ambulatory Visit: Payer: Self-pay | Attending: Internal Medicine

## 2019-12-26 DIAGNOSIS — Z23 Encounter for immunization: Secondary | ICD-10-CM

## 2019-12-26 NOTE — Progress Notes (Signed)
   Covid-19 Vaccination Clinic  Name:  [name]    MRN: [id number] DOB: 09-12-1966  12/26/2019  [name] was observed post Covid-19 immunization for 15 minutes without incident. She was provided with Vaccine Information Sheet and instruction to access the V-Safe system.   [name] was instructed to call 911 with any severe reactions post vaccine: Marland Kitchen Difficulty breathing  . Swelling of face and throat  . A fast heartbeat  . A bad rash all over body  . Dizziness and weakness   Immunizations Administered    Name Date Dose VIS Date Route   Pfizer COVID-19 Vaccine 12/26/2019  9:37 AM 0.3 mL 09/11/2019 Intramuscular   Manufacturer: [place]   Lot: [id number]   [place]: [id number]

## 2019-12-28 MED FILL — $TOUJEO SOLOSTAR 300 UNITS/: 300 | 30 days supply | Qty: 2 | Fill #3

## 2019-12-28 MED FILL — ROSUVASTATIN CALCIUM 10 MG: 10 | 30 days supply | Qty: 60 | Fill #1

## 2019-12-28 MED FILL — ?HYDROCHLOROTHIAZIDE 25MG T: 25 | 30 days supply | Qty: 30 | Fill #0

## 2019-12-28 MED FILL — ?TRAZODONE HCL 50 TABS: 50 | 30 days supply | Qty: 30 | Fill #4

## 2019-12-28 MED FILL — AMLODIPINE BESYLATE 10 MG T: 10 | 30 days supply | Qty: 30 | Fill #4

## 2019-12-28 MED FILL — LISINOPRIL 40 MG TABLET: 40 | 30 days supply | Qty: 30 | Fill #3

## 2019-12-28 MED FILL — ?CARVEDILOL 3.125 MG TABLET: 3.125 | 30 days supply | Qty: 60 | Fill #4

## 2020-01-20 ENCOUNTER — Ambulatory Visit: Payer: Self-pay | Attending: Internal Medicine

## 2020-01-20 DIAGNOSIS — Z23 Encounter for immunization: Secondary | ICD-10-CM

## 2020-01-20 NOTE — Progress Notes (Signed)
   Covid-19 Vaccination Clinic  Name:  [name]    MRN: [id number] DOB: Dec 01, 1965  01/20/2020  [name] was observed post Covid-19 immunization for 15 minutes without incident. She was provided with Vaccine Information Sheet and instruction to access the V-Safe system.   [name] was instructed to call 911 with any severe reactions post vaccine: Marland Kitchen Difficulty breathing  . Swelling of face and throat  . A fast heartbeat  . A bad rash all over body  . Dizziness and weakness   Immunizations Administered    Name Date Dose VIS Date Route   Pfizer COVID-19 Vaccine 01/20/2020  8:39 AM 0.3 mL 11/25/2018 Intramuscular   Manufacturer: [place]   Lot: [id number]   [place]: [id number]

## 2020-01-26 ENCOUNTER — Other Ambulatory Visit: Payer: Self-pay | Admitting: Family Medicine

## 2020-01-26 DIAGNOSIS — E782 Mixed hyperlipidemia: Secondary | ICD-10-CM

## 2020-01-26 DIAGNOSIS — E119 Type 2 diabetes mellitus without complications: Secondary | ICD-10-CM

## 2020-01-26 MED FILL — LISINOPRIL 40 MG TABLET: 40 | 30 days supply | Qty: 30 | Fill #4

## 2020-01-26 MED FILL — AMLODIPINE BESYLATE 10 MG T: 10 | 30 days supply | Qty: 30 | Fill #5

## 2020-01-26 MED FILL — $TOUJEO SOLOSTAR 300 UNITS/: 300 | 30 days supply | Qty: 2 | Fill #4

## 2020-01-26 MED FILL — ?CARVEDILOL 3.125 MG TABLET: 3.125 | 30 days supply | Qty: 60 | Fill #5

## 2020-01-26 MED FILL — HYDROCHLOROTHIAZIDE 25 MG T: 25 | 30 days supply | Qty: 30 | Fill #1

## 2020-01-29 MED FILL — ?ROSUVASTATIN CALCIUM 10 MG: 10 | 30 days supply | Qty: 60 | Fill #0

## 2020-02-01 MED FILL — ?TRAZODONE HCL 50 TABS: 50 | 30 days supply | Qty: 30 | Fill #5

## 2020-03-01 ENCOUNTER — Other Ambulatory Visit: Payer: Self-pay | Admitting: Family Medicine

## 2020-03-01 DIAGNOSIS — G479 Sleep disorder, unspecified: Secondary | ICD-10-CM

## 2020-03-01 MED FILL — !toujeo SOLOSTAR 300 UNIT/M: 300 | 30 days supply | Qty: 2 | Fill #5

## 2020-03-01 MED FILL — LISINOPRIL 40 MG TABLET: 40 | 30 days supply | Qty: 30 | Fill #5

## 2020-03-01 MED FILL — HYDROCHLOROTHIAZIDE 25 MG T: 25 | 30 days supply | Qty: 30 | Fill #2

## 2020-03-04 MED FILL — ?TRAZODONE HCL 50 TABS: 50 | 30 days supply | Qty: 30 | Fill #0

## 2020-04-08 ENCOUNTER — Other Ambulatory Visit: Payer: Self-pay | Admitting: Family Medicine

## 2020-04-08 DIAGNOSIS — E119 Type 2 diabetes mellitus without complications: Secondary | ICD-10-CM

## 2020-04-08 DIAGNOSIS — Z794 Long term (current) use of insulin: Secondary | ICD-10-CM

## 2020-04-08 MED FILL — LISINOPRIL 40 MG TABLET: 40 | 30 days supply | Qty: 30 | Fill #0

## 2020-04-08 MED FILL — ?TRAZODONE HCL 50 TABS: 50 | 30 days supply | Qty: 30 | Fill #1

## 2020-04-08 NOTE — Telephone Encounter (Signed)
Requested medication (s) are due for refill today: yes  Requested medication (s) are on the active medication list: yes  Last refill:  03/01/2020  Future visit scheduled: no  Notes to clinic:  patient due for labs and follow up   Requested Prescriptions  Pending Prescriptions Disp Refills   TOUJEO SOLOSTAR 300 UNIT/ML Solostar Pen [Pharmacy Med Name: toujeo SOLOSTAR 300 UNIT/M 300 Solution Pen-injector] 1.5 mL 1    Sig: Inject 15 Units into the skin daily.      Endocrinology:  Diabetes - Insulins Failed - 04/08/2020 10:19 AM      Failed - HBA1C is between 0 and 7.9 and within 180 days    Hemoglobin A1C  Date Value Ref Range Status  09/01/2019 5.2 (NE)  Final          Failed - Valid encounter within last 6 months    Recent Outpatient Visits           7 months ago Controlled type 2 diabetes mellitus without complication, with long-term current use of insulin ([place])   [place] Fulp, [place], MD   1 year ago Carotid stenosis, left   [place] Fulp, [place], MD   1 year ago Controlled type 2 diabetes mellitus without complication, with long-term current use of insulin ([place])   [place] Fulp, [place], MD   2 years ago Controlled type 2 diabetes mellitus without complication, with long-term current use of insulin [facility])   [place], FNP   5 years ago Diabetes type 2, uncontrolled   [facility] Boykin Nearing, MD

## 2020-04-11 MED FILL — $TOUJEO SOLOSTAR 300 UNITS/: 300 | 30 days supply | Qty: 2 | Fill #0

## 2020-04-12 MED FILL — HYDROCHLOROTHIAZIDE 25 MG T: 25 | 30 days supply | Qty: 30 | Fill #3

## 2020-05-09 ENCOUNTER — Other Ambulatory Visit: Payer: Self-pay | Admitting: Family Medicine

## 2020-05-09 DIAGNOSIS — E119 Type 2 diabetes mellitus without complications: Secondary | ICD-10-CM

## 2020-05-09 MED FILL — HYDROCHLOROTHIAZIDE 25 MG T: 25 | 30 days supply | Qty: 30 | Fill #4

## 2020-05-09 MED FILL — LISINOPRIL 40 MG TABLET: 40 | 30 days supply | Qty: 30 | Fill #1

## 2020-05-09 MED FILL — traZODone HCL 50 MG TABS: 50 | 30 days supply | Qty: 30 | Fill #2

## 2020-05-09 NOTE — Telephone Encounter (Signed)
Requested medication (s) are due for refill today: no  Requested medication (s) are on the active medication list: yes  Last refill:  04/11/2020  Future visit scheduled: no  Notes to clinic: overdue for office visit Vm left for patient to callback for appointment    Requested Prescriptions  Pending Prescriptions Disp Refills   TOUJEO SOLOSTAR 300 UNIT/ML Solostar Pen [Pharmacy Med Name: TOUJEO SOLOSTAR 300 UNITS/ 300 Solution Pen-injector] 1.5 mL 0    Sig: INJECT 15 UNITS INTO THE SKIN DAILY.      Endocrinology:  Diabetes - Insulins Failed - 05/09/2020  3:13 PM      Failed - HBA1C is between 0 and 7.9 and within 180 days    Hemoglobin A1C  Date Value Ref Range Status  09/01/2019 5.2 (NE)  Final          Failed - Valid encounter within last 6 months    Recent Outpatient Visits           8 months ago Controlled type 2 diabetes mellitus without complication, with long-term current use of insulin ([place])   [place] Fulp, [place], MD   1 year ago Carotid stenosis, left   [place] Fulp, [place], MD   1 year ago Controlled type 2 diabetes mellitus without complication, with long-term current use of insulin ([place])   [place] Fulp, [place], MD   2 years ago Controlled type 2 diabetes mellitus without complication, with long-term current use of insulin [facility])   [place], FNP   5 years ago Diabetes type 2, uncontrolled   [facility] Boykin Nearing, MD

## 2020-05-10 MED FILL — $TOUJEO SOLOSTAR 300 UNITS/: 300 | 30 days supply | Qty: 2 | Fill #0

## 2020-05-10 NOTE — Telephone Encounter (Signed)
Patient scheduled medication follow up for 06/02/2020, patient states she will run out of insulin on 8/16 please advise

## 2020-06-02 ENCOUNTER — Encounter: Payer: Self-pay | Admitting: Physician Assistant

## 2020-06-02 ENCOUNTER — Ambulatory Visit: Payer: Self-pay | Attending: Physician Assistant | Admitting: Physician Assistant

## 2020-06-02 ENCOUNTER — Other Ambulatory Visit: Payer: Self-pay | Admitting: Physician Assistant

## 2020-06-02 ENCOUNTER — Other Ambulatory Visit: Payer: Self-pay

## 2020-06-02 VITALS — BP 166/82 | HR 69 | Temp 98.0°F | Resp 16 | Wt 130.2 lb

## 2020-06-02 DIAGNOSIS — Z794 Long term (current) use of insulin: Secondary | ICD-10-CM

## 2020-06-02 DIAGNOSIS — E119 Type 2 diabetes mellitus without complications: Secondary | ICD-10-CM

## 2020-06-02 DIAGNOSIS — R7989 Other specified abnormal findings of blood chemistry: Secondary | ICD-10-CM

## 2020-06-02 DIAGNOSIS — I10 Essential (primary) hypertension: Secondary | ICD-10-CM

## 2020-06-02 DIAGNOSIS — G479 Sleep disorder, unspecified: Secondary | ICD-10-CM

## 2020-06-02 DIAGNOSIS — E782 Mixed hyperlipidemia: Secondary | ICD-10-CM

## 2020-06-02 LAB — POCT GLYCOSYLATED HEMOGLOBIN (HGB A1C): Hemoglobin A1C: 5.5 % (ref 4.0–5.6)

## 2020-06-02 LAB — GLUCOSE, POCT (MANUAL RESULT ENTRY): POC Glucose: 152 mg/dl — AB (ref 70–99)

## 2020-06-02 MED ORDER — BD PEN NEEDLE SHORT U/F 31G X 8 MM MISC: 1.0000 "application " | Freq: Every day | 3 refills | Status: DC

## 2020-06-02 MED ORDER — ROSUVASTATIN CALCIUM 10 MG PO TABS: 20.0000 mg | ORAL_TABLET | Freq: Every day | ORAL | 1 refills | Status: DC

## 2020-06-02 MED ORDER — LISINOPRIL 40 MG PO TABS: 40.0000 mg | ORAL_TABLET | Freq: Every day | ORAL | 1 refills | Status: DC

## 2020-06-02 MED ORDER — TOUJEO SOLOSTAR 300 UNIT/ML ~~LOC~~ SOPN: 15.0000 [IU] | PEN_INJECTOR | Freq: Every day | SUBCUTANEOUS | 5 refills | Status: DC

## 2020-06-02 MED ORDER — CARVEDILOL 3.125 MG PO TABS: 3.1250 mg | ORAL_TABLET | Freq: Two times a day (BID) | ORAL | 1 refills | Status: DC

## 2020-06-02 MED ORDER — TRUE METRIX BLOOD GLUCOSE TEST VI STRP: 1.0000 | ORAL_STRIP | Freq: Three times a day (TID) | 11 refills | Status: DC

## 2020-06-02 MED ORDER — TRAZODONE HCL 50 MG PO TABS: ORAL_TABLET | ORAL | 1 refills | Status: DC

## 2020-06-02 MED ORDER — AMLODIPINE BESYLATE 10 MG PO TABS: 10.0000 mg | ORAL_TABLET | Freq: Every day | ORAL | 1 refills | Status: DC

## 2020-06-02 MED ORDER — HYDROCHLOROTHIAZIDE 25 MG PO TABS: 25.0000 mg | ORAL_TABLET | Freq: Every day | ORAL | 1 refills | Status: DC

## 2020-06-02 MED ORDER — TRUEPLUS LANCETS 28G MISC: 1.0000 | Freq: Three times a day (TID) | 11 refills | Status: DC

## 2020-06-02 MED FILL — TRUEPLUS PEN NDL 31GX5/16: 31G X 8 MM | 25 days supply | Qty: 100 | Fill #0

## 2020-06-02 MED FILL — AMLODIPINE BESYLATE 10 MG T: 10 | 30 days supply | Qty: 30 | Fill #0

## 2020-06-02 MED FILL — ?ROSUVASTATIN CALCIUM 10 MG: 10 | 30 days supply | Qty: 60 | Fill #0

## 2020-06-02 MED FILL — CARVEDILOL 3.125 MG TABLET: 3.125 | 30 days supply | Qty: 60 | Fill #0

## 2020-06-02 MED FILL — TRUEplus LANCETS 28G MISC: 33 days supply | Qty: 100 | Fill #0

## 2020-06-02 NOTE — Progress Notes (Signed)
[name], is a 54 y.o. female  UMP:[id number]  QMG:[id number]  DOB - 03-17-1966  Subjective:  Chief Complaint and HPI: [name] is a 54 y.o. female here today for med RF.  trazadone doesn't always work at 1 tab at bedtime.  She mostly forgets her cholesterol meds and her night time BP meds and did not take BP meds today.  Blood sugars run from 90-170.  No other new issues today.     ROS:   Constitutional:  No f/c, No night sweats, No unexplained weight loss. EENT:  No vision changes, No blurry vision, No hearing changes. No mouth, throat, or ear problems.  Respiratory: No cough, No SOB Cardiac: No CP, no palpitations GI:  No abd pain, No N/V/D. GU: No Urinary s/sx Musculoskeletal: No joint pain Neuro: No headache, no dizziness, no motor weakness.  Skin: No rash Endocrine:  No polydipsia. No polyuria.  Psych: Denies SI/HI  No problems updated.  ALLERGIES: No Known Allergies  PAST MEDICAL HISTORY: Past Medical History:  Diagnosis Date   Anxiety    At age of 60   Gestational diabetes 32   when pregnant with daughter    Hypertension 37   At age of 60    MEDICATIONS AT HOME: Prior to Admission medications   Medication Sig Start Date End Date Taking? Authorizing Provider  amLODipine (NORVASC) 10 MG tablet Take 1 tablet (10 mg total) by mouth at bedtime. To lower blood pressure 06/02/20   Davionte Lusby M, PA-C  Blood Glucose Monitoring Suppl (TRUE METRIX METER) W/DEVICE KIT 1 each by Does not apply route as needed. 02/21/15   Boykin Nearing, MD  carvedilol (COREG) 3.125 MG tablet Take 1 tablet (3.125 mg total) by mouth 2 (two) times daily with a meal. To lower blood pressure 06/02/20   Jamisha Hoeschen M, PA-C  glucose blood (TRUE METRIX BLOOD GLUCOSE TEST) test strip 1 each by Other route 3 (three) times daily. 06/02/20   Argentina Donovan, PA-C  hydrochlorothiazide (HYDRODIURIL) 25 MG tablet Take 1 tablet (25 mg total) by mouth daily. To lower blood pressure  06/02/20   Aleera Gilcrease, Levada Dy M, PA-C  insulin glargine, 1 Unit Dial, (TOUJEO SOLOSTAR) 300 UNIT/ML Solostar Pen Inject 15 Units into the skin daily. 06/02/20 08/31/20  Argentina Donovan, PA-C  Insulin Pen Needle (B-D ULTRAFINE III SHORT PEN) 31G X 8 MM MISC 1 application by Does not apply route daily. 06/02/20   Argentina Donovan, PA-C  lisinopril (ZESTRIL) 40 MG tablet Take 1 tablet (40 mg total) by mouth daily. To lower blood pressure 06/02/20   Freeman Caldron M, PA-C  loperamide (IMODIUM) 2 MG capsule Take 1 capsule (2 mg total) by mouth as needed for diarrhea or loose stools (available over the counter). 09/17/19   Rai, Ripudeep K, MD  MELATONIN PO Take 1 tablet by mouth at bedtime as needed (sleep).     [provider]  ondansetron (ZOFRAN ODT) 4 MG disintegrating tablet Take 1 tablet (4 mg total) by mouth every 8 (eight) hours as needed for nausea or vomiting. Patient not taking: Reported on 06/02/2020 09/17/19   Rai, Vernelle Emerald, MD  rosuvastatin (CRESTOR) 10 MG tablet Take 2 tablets (20 mg total) by mouth daily. 06/02/20   Argentina Donovan, PA-C  traMADol (ULTRAM) 50 MG tablet Take 1 tablet (50 mg total) by mouth every 8 (eight) hours as needed for moderate pain or severe pain. Patient not taking: Reported on 06/02/2020 09/17/19 09/16/20  Rai, Ripudeep  K, MD  traZODone (DESYREL) 50 MG tablet 1 to 2 tabs at bedtime prn insomnia 06/02/20   Argentina Donovan, PA-C  TRUEplus Lancets 28G MISC 1 each by Does not apply route 3 (three) times daily. 06/02/20   Argentina Donovan, PA-C     Objective:  EXAM:   Vitals:   06/02/20 0839  BP: (!) 166/82  Pulse: 69  Resp: 16  Temp: 98 F (36.7 C)  SpO2: 100%  Weight: 130 lb 3.2 oz (59.1 kg)    General appearance : A&OX3. NAD. Non-toxic-appearing HEENT: Atraumatic and Normocephalic.  PERRLA.  Chest/Lungs:  Breathing-non-labored, Good air entry bilaterally, breath sounds normal without rales, rhonchi, or wheezing  CVS: S1 S2 regular, no murmurs, gallops,  rubs  Extremities: Bilateral Lower Ext shows no edema, both legs are warm to touch with = pulse throughout Neurology:  CN II-XII grossly intact, Non focal.   Psych:  TP linear. J/I WNL. Normal speech. Appropriate eye contact and affect.  Skin:  No Rash  Data Review Lab Results  Component Value Date   HGBA1C 5.5 06/02/2020   HGBA1C 5.2 (NE) 09/01/2019   HGBA1C 5.0 06/23/2018     Assessment & Plan   1. Controlled type 2 diabetes mellitus without complication, with long-term current use of insulin (HCC) Controlled-continue current regimen - Glucose (CBG) - POCT glycosylated hemoglobin (Hb A1C) - rosuvastatin (CRESTOR) 10 MG tablet; Take 2 tablets (20 mg total) by mouth daily.  Dispense: 180 tablet; Refill: 1 - insulin glargine, 1 Unit Dial, (TOUJEO SOLOSTAR) 300 UNIT/ML Solostar Pen; Inject 15 Units into the skin daily.  Dispense: 1.5 mL; Refill: 5 - TRUEplus Lancets 28G MISC; 1 each by Does not apply route 3 (three) times daily.  Dispense: 100 each; Refill: 11 - Insulin Pen Needle (B-D ULTRAFINE III SHORT PEN) 31G X 8 MM MISC; 1 application by Does not apply route daily.  Dispense: 90 each; Refill: 3 - glucose blood (TRUE METRIX BLOOD GLUCOSE TEST) test strip; 1 each by Other route 3 (three) times daily.  Dispense: 100 each; Refill: 11  2. Essential hypertension Uncontrolled but forgets to take evening doses and has not taken meds today.  BP OOO ~130/70-80 - CBC with Differential/Platelet - Comprehensive metabolic panel - amLODipine (NORVASC) 10 MG tablet; Take 1 tablet (10 mg total) by mouth at bedtime. To lower blood pressure  Dispense: 90 tablet; Refill: 1 - carvedilol (COREG) 3.125 MG tablet; Take 1 tablet (3.125 mg total) by mouth 2 (two) times daily with a meal. To lower blood pressure  Dispense: 180 tablet; Refill: 1 - hydrochlorothiazide (HYDRODIURIL) 25 MG tablet; Take 1 tablet (25 mg total) by mouth daily. To lower blood pressure  Dispense: 90 tablet; Refill: 1  3. Mixed  hyperlipidemia Missing doses of meds - Lipid panel - rosuvastatin (CRESTOR) 10 MG tablet; Take 2 tablets (20 mg total) by mouth daily.  Dispense: 180 tablet; Refill: 1  4. Elevated LFTs Avoid alcohol. - Comprehensive metabolic panel  5. Uncontrolled hypertension continue - lisinopril (ZESTRIL) 40 MG tablet; Take 1 tablet (40 mg total) by mouth daily. To lower blood pressure  Dispense: 90 tablet; Refill: 1  6. Sleeping difficulty Can increase dose of trazadone.   - TSH - traZODone (DESYREL) 50 MG tablet; 1 to 2 tabs at bedtime prn insomnia  Dispense: 120 tablet; Refill: 1   Patient have been counseled extensively about nutrition and exercise  Return in about 3 months (around 09/01/2020) for PCP;  chronic conditions.  The patient  was given clear instructions to go to ER or return to medical center if symptoms don't improve, worsen or new problems develop. The patient verbalized understanding. The patient was told to call to get lab results if they haven't heard anything in the next week.    Freeman Caldron, PA-C [facility] [place], [place]   06/02/2020, 8:55 AMPatient ID: [name], female   DOB: [birth year], 54 y.o.   MRN: [id number]

## 2020-06-03 LAB — COMPREHENSIVE METABOLIC PANEL
ALT: 52 IU/L — ABNORMAL HIGH (ref 0–32)
AST: 48 IU/L — ABNORMAL HIGH (ref 0–40)
Albumin/Globulin Ratio: 2 (ref 1.2–2.2)
Albumin: 5 g/dL — ABNORMAL HIGH (ref 3.8–4.9)
Alkaline Phosphatase: 71 IU/L (ref 48–121)
BUN/Creatinine Ratio: 26 — ABNORMAL HIGH (ref 9–23)
BUN: 15 mg/dL (ref 6–24)
Bilirubin Total: 0.8 mg/dL (ref 0.0–1.2)
CO2: 22 mmol/L (ref 20–29)
Calcium: 9.7 mg/dL (ref 8.7–10.2)
Chloride: 95 mmol/L — ABNORMAL LOW (ref 96–106)
Creatinine, Ser: 0.57 mg/dL (ref 0.57–1.00)
GFR calc Af Amer: 122 mL/min/{1.73_m2} (ref >59)
GFR calc non Af Amer: 105 mL/min/{1.73_m2} (ref >59)
Globulin, Total: 2.5 g/dL (ref 1.5–4.5)
Glucose: 132 mg/dL — ABNORMAL HIGH (ref 65–99)
Potassium: 3.8 mmol/L (ref 3.5–5.2)
Sodium: 134 mmol/L (ref 134–144)
Total Protein: 7.5 g/dL (ref 6.0–8.5)

## 2020-06-03 LAB — CBC WITH DIFFERENTIAL/PLATELET
Basophils Absolute: 0.1 10*3/uL (ref 0.0–0.2)
Basos: 1 %
EOS (ABSOLUTE): 0.1 10*3/uL (ref 0.0–0.4)
Eos: 2 %
Hematocrit: 39.5 % (ref 34.0–46.6)
Hemoglobin: 13.4 g/dL (ref 11.1–15.9)
Immature Grans (Abs): 0 10*3/uL (ref 0.0–0.1)
Immature Granulocytes: 0 %
Lymphocytes Absolute: 1.6 10*3/uL (ref 0.7–3.1)
Lymphs: 21 %
MCH: 32.9 pg (ref 26.6–33.0)
MCHC: 33.9 g/dL (ref 31.5–35.7)
MCV: 97 fL (ref 79–97)
Monocytes Absolute: 0.6 10*3/uL (ref 0.1–0.9)
Monocytes: 8 %
Neutrophils Absolute: 5.4 10*3/uL (ref 1.4–7.0)
Neutrophils: 68 %
Platelets: 234 10*3/uL (ref 150–450)
RBC: 4.07 x10E6/uL (ref 3.77–5.28)
RDW: 12.1 % (ref 11.7–15.4)
WBC: 7.8 10*3/uL (ref 3.4–10.8)

## 2020-06-03 LAB — LIPID PANEL
Chol/HDL Ratio: 5.2 ratio — ABNORMAL HIGH (ref 0.0–4.4)
Cholesterol, Total: 215 mg/dL — ABNORMAL HIGH (ref 100–199)
HDL: 41 mg/dL (ref >39)
LDL Chol Calc (NIH): 139 mg/dL — ABNORMAL HIGH (ref 0–99)
Triglycerides: 194 mg/dL — ABNORMAL HIGH (ref 0–149)
VLDL Cholesterol Cal: 35 mg/dL (ref 5–40)

## 2020-06-03 LAB — TSH: TSH: 2.05 u[IU]/mL (ref 0.450–4.500)

## 2020-06-03 LAB — VITAMIN D 25 HYDROXY (VIT D DEFICIENCY, FRACTURES): Vit D, 25-Hydroxy: 30.2 ng/mL (ref 30.0–100.0)

## 2020-06-03 MED FILL — HYDROCHLOROTHIAZIDE 25 MG T: 25 | 30 days supply | Qty: 30 | Fill #5

## 2020-06-03 MED FILL — $TOUJEO SOLOSTAR 300 UNITS/: 300 | 30 days supply | Qty: 2 | Fill #0

## 2020-06-03 MED FILL — traZODone HCL 50 MG TABS: 50 | 30 days supply | Qty: 60 | Fill #0

## 2020-06-16 MED FILL — LISINOPRIL 40 MG TABLET: 40 | 30 days supply | Qty: 30 | Fill #2

## 2020-07-05 MED FILL — $TOUJEO SOLOSTAR 300 UNITS/: 300 | 30 days supply | Qty: 2 | Fill #1

## 2020-07-05 MED FILL — traZODone HCL 50 MG TABS: 50 | 30 days supply | Qty: 60 | Fill #1

## 2020-07-05 MED FILL — HYDROCHLOROTHIAZIDE 25 MG T: 25 | 30 days supply | Qty: 30 | Fill #0

## 2020-07-06 MED FILL — LISINOPRIL 40 MG TABLET: 40 | 30 days supply | Qty: 30 | Fill #0

## 2020-07-19 ENCOUNTER — Other Ambulatory Visit: Payer: Self-pay

## 2020-07-19 ENCOUNTER — Emergency Department (HOSPITAL_BASED_OUTPATIENT_CLINIC_OR_DEPARTMENT_OTHER)
Admission: EM | Admit: 2020-07-19 | Discharge: 2020-07-20 | Disposition: A | Discharge status: Home / Self Care | Payer: Self-pay | Attending: Emergency Medicine | Admitting: Emergency Medicine

## 2020-07-19 ENCOUNTER — Encounter (HOSPITAL_BASED_OUTPATIENT_CLINIC_OR_DEPARTMENT_OTHER): Payer: Self-pay | Admitting: *Deleted

## 2020-07-19 ENCOUNTER — Emergency Department (HOSPITAL_BASED_OUTPATIENT_CLINIC_OR_DEPARTMENT_OTHER): Payer: Self-pay

## 2020-07-19 DIAGNOSIS — E119 Type 2 diabetes mellitus without complications: Secondary | ICD-10-CM | POA: Insufficient documentation

## 2020-07-19 DIAGNOSIS — M79621 Pain in right upper arm: Secondary | ICD-10-CM | POA: Insufficient documentation

## 2020-07-19 DIAGNOSIS — S7001XA Contusion of right hip, initial encounter: Secondary | ICD-10-CM | POA: Insufficient documentation

## 2020-07-19 DIAGNOSIS — S0511XA Contusion of eyeball and orbital tissues, right eye, initial encounter: Secondary | ICD-10-CM | POA: Insufficient documentation

## 2020-07-19 DIAGNOSIS — I10 Essential (primary) hypertension: Secondary | ICD-10-CM | POA: Insufficient documentation

## 2020-07-19 DIAGNOSIS — S300XXA Contusion of lower back and pelvis, initial encounter: Secondary | ICD-10-CM | POA: Insufficient documentation

## 2020-07-19 DIAGNOSIS — M25511 Pain in right shoulder: Secondary | ICD-10-CM | POA: Insufficient documentation

## 2020-07-19 DIAGNOSIS — W108XXA Fall (on) (from) other stairs and steps, initial encounter: Secondary | ICD-10-CM | POA: Insufficient documentation

## 2020-07-19 DIAGNOSIS — S52121A Displaced fracture of head of right radius, initial encounter for closed fracture: Secondary | ICD-10-CM

## 2020-07-19 DIAGNOSIS — Z794 Long term (current) use of insulin: Secondary | ICD-10-CM | POA: Insufficient documentation

## 2020-07-19 DIAGNOSIS — M546 Pain in thoracic spine: Secondary | ICD-10-CM | POA: Insufficient documentation

## 2020-07-19 DIAGNOSIS — S80812A Abrasion, left lower leg, initial encounter: Secondary | ICD-10-CM | POA: Insufficient documentation

## 2020-07-19 DIAGNOSIS — S80211A Abrasion, right knee, initial encounter: Secondary | ICD-10-CM | POA: Insufficient documentation

## 2020-07-19 DIAGNOSIS — M542 Cervicalgia: Secondary | ICD-10-CM | POA: Insufficient documentation

## 2020-07-19 DIAGNOSIS — M25521 Pain in right elbow: Secondary | ICD-10-CM | POA: Insufficient documentation

## 2020-07-19 DIAGNOSIS — W19XXXA Unspecified fall, initial encounter: Secondary | ICD-10-CM

## 2020-07-19 MED ORDER — METHOCARBAMOL 500 MG PO TABS: 500.0000 mg | ORAL_TABLET | Freq: Two times a day (BID) | ORAL | 0 refills | Status: DC

## 2020-07-19 MED ORDER — HYDROCODONE-ACETAMINOPHEN 5-325 MG PO TABS
1.0000 | ORAL_TABLET | ORAL | 0 refills | Status: DC | PRN
Start: 2020-07-19 — End: 2021-06-20

## 2020-07-19 MED ORDER — KETOROLAC TROMETHAMINE 15 MG/ML IJ SOLN
15.0000 mg | Freq: Once | INTRAMUSCULAR | Status: AC
  Administered 2020-07-19: 15 mg via INTRAMUSCULAR
  Filled 2020-07-19: qty 1

## 2020-07-19 MED ORDER — IBUPROFEN 600 MG PO TABS: 600.0000 mg | ORAL_TABLET | Freq: Four times a day (QID) | ORAL | 0 refills | Status: DC | PRN

## 2020-07-19 MED ORDER — METHOCARBAMOL 500 MG PO TABS: 500.0000 mg | ORAL_TABLET | Freq: Once | ORAL | Status: DC

## 2020-07-19 NOTE — ED Triage Notes (Signed)
She took the dog out last week and he pulled her down. She sustained a hematoma to the right side of her face. Today she has pain in her right shoulder and arm. Bruise to her hip. Abrasion to her right knee.

## 2020-07-19 NOTE — ED Provider Notes (Signed)
[facility] EMERGENCY DEPARTMENT Provider Note   CSN: [id number] Arrival date & time: 07/19/20  1813     History Chief Complaint  Patient presents with  . Fall    [name] is a 54 y.o. female who presents 1 week after a fall; she states she was walking her dog who is  65 pounds when he pulled her to the ground.  She states that time she had a hematoma and bruising wound the right eye, abrasions on her right knee and shin, and right elbow pain.  She states that she fell down 3 steps during that fall.  She reports that her pain was improving in her hip and her leg, but persisting in her elbow.  Patient is not on any anticoagulation. And reports that 3 days ago she was once again pulled over by her dog, he was able to catch herself before she hit the ground.  She states that her right hip and shoulder have been more sore since this second incident.  Her primary concern is her right elbow pain that has persisted despite Tylenol and ibuprofen use.  She denies numbness or tingling in her hand, denies weakness in the hand.  Denies prior injury to this elbow.  I personally reviewed this patient's medical records.  She has a history of type 2 diabetes, hypertension, hyperlipidemia. She states she started smoking when she was 54 years old, however was able to achieve cessation 1 year ago; she is very excited about this.  HPI     Past Medical History:  Diagnosis Date  . Anxiety    At age of 57  . Gestational diabetes 64   when pregnant with daughter   . Hypertension 1999   At age of 35    Patient Active Problem List   Diagnosis Date Noted  . Controlled type 2 diabetes mellitus without complication, with long-term current use of insulin ([place]) 09/19/2019  . Carotid stenosis, left 09/19/2019  . Acute colitis 09/15/2019  . SIRS (systemic inflammatory response syndrome) ([place]) 09/15/2019  . AKI (acute kidney injury) ([place]) 09/15/2019  . Mixed hyperlipidemia 06/04/2017  .  Alcoholic hepatitis without ascites 02/23/2015  . HTN (hypertension), malignant 02/21/2015  . Current smoker 02/21/2015  . Diabetes type 2, uncontrolled ([place]) 02/21/2015  . Poor dentition 02/21/2015  . ETOH abuse 02/21/2015    Past Surgical History:  Procedure Laterality Date  . ABDOMINAL HYSTERECTOMY       OB History   No obstetric history on file.     Family History  Problem Relation Age of Onset  . Hypertension Mother   . Diabetes Father   . Hypertension Sister   . Hypertension Brother     Social History   Tobacco Use  . Smoking status: Former Smoker    Packs/day: 1.00    Types: Cigarettes  . Smokeless tobacco: Never Used  Vaping Use  . Vaping Use: Never used  Substance Use Topics  . Alcohol use: Yes    Alcohol/week: 0.0 standard drinks    Comment: 40 oz daily   . Drug use: No    Home Medications Prior to Admission medications   Medication Sig Start Date End Date Taking? Authorizing Provider  amLODipine (NORVASC) 10 MG tablet Take 1 tablet (10 mg total) by mouth at bedtime. To lower blood pressure 06/02/20   McClung, Angela M, PA-C  Blood Glucose Monitoring Suppl (TRUE METRIX METER) W/DEVICE KIT 1 each by Does not apply route as needed. 02/21/15  Boykin Nearing, MD  carvedilol (COREG) 3.125 MG tablet Take 1 tablet (3.125 mg total) by mouth 2 (two) times daily with a meal. To lower blood pressure 06/02/20   McClung, Angela M, PA-C  glucose blood (TRUE METRIX BLOOD GLUCOSE TEST) test strip 1 each by Other route 3 (three) times daily. 06/02/20   Argentina Donovan, PA-C  hydrochlorothiazide (HYDRODIURIL) 25 MG tablet Take 1 tablet (25 mg total) by mouth daily. To lower blood pressure 06/02/20   McClung, Dionne Bucy, PA-C  HYDROcodone-acetaminophen (NORCO/VICODIN) 5-325 MG tablet Take 1-2 tablets by mouth every 4 (four) hours as needed. 07/19/20   Micheline Markes, Eugene Garnet R, PA-C  ibuprofen (ADVIL) 600 MG tablet Take 1 tablet (600 mg total) by mouth every 6 (six) hours as needed.  07/19/20   Latiqua Daloia, Eugene Garnet R, PA-C  insulin glargine, 1 Unit Dial, (TOUJEO SOLOSTAR) 300 UNIT/ML Solostar Pen Inject 15 Units into the skin daily. 06/02/20 08/31/20  Argentina Donovan, PA-C  Insulin Pen Needle (B-D ULTRAFINE III SHORT PEN) 31G X 8 MM MISC 1 application by Does not apply route daily. 06/02/20   Argentina Donovan, PA-C  lisinopril (ZESTRIL) 40 MG tablet Take 1 tablet (40 mg total) by mouth daily. To lower blood pressure 06/02/20   Freeman Caldron M, PA-C  loperamide (IMODIUM) 2 MG capsule Take 1 capsule (2 mg total) by mouth as needed for diarrhea or loose stools (available over the counter). 09/17/19   Rai, Ripudeep K, MD  MELATONIN PO Take 1 tablet by mouth at bedtime as needed (sleep).     [provider]  methocarbamol (ROBAXIN) 500 MG tablet Take 1 tablet (500 mg total) by mouth 2 (two) times daily. 07/19/20   Antoni Stefan R, PA-C  ondansetron (ZOFRAN ODT) 4 MG disintegrating tablet Take 1 tablet (4 mg total) by mouth every 8 (eight) hours as needed for nausea or vomiting. Patient not taking: Reported on 06/02/2020 09/17/19   Rai, Vernelle Emerald, MD  rosuvastatin (CRESTOR) 10 MG tablet Take 2 tablets (20 mg total) by mouth daily. 06/02/20   Argentina Donovan, PA-C  traZODone (DESYREL) 50 MG tablet 1 to 2 tabs at bedtime prn insomnia 06/02/20   Argentina Donovan, PA-C  TRUEplus Lancets 28G MISC 1 each by Does not apply route 3 (three) times daily. 06/02/20   Argentina Donovan, PA-C    Allergies    Patient has no known allergies.  Review of Systems   Review of Systems  Constitutional: Negative.   HENT: Positive for facial swelling.   Eyes: Negative for photophobia, pain, redness and visual disturbance.  Respiratory: Negative.   Cardiovascular: Negative.   Gastrointestinal: Negative.   Genitourinary: Negative.   Musculoskeletal: Positive for arthralgias, joint swelling and myalgias. Negative for neck pain and neck stiffness.  Skin: Positive for wound.  Neurological:  Negative for dizziness, weakness, light-headedness and numbness.  Hematological: Negative.     Physical Exam Updated Vital Signs BP (!) 164/84   Pulse 76   Temp 98.5 F (36.9 C) (Oral)   Resp 20   Ht _0  (1.575 m)   Wt 60.3 kg   SpO2 99%   BMI 24.33 kg/m   Physical Exam HENT:     Head:   Eyes:     Extraocular Movements: Extraocular movements intact.     Conjunctiva/sclera: Conjunctivae normal.     Pupils: Pupils are equal, round, and reactive to light.  Musculoskeletal:     Right shoulder: Tenderness present. No bony tenderness. Normal range  of motion. Normal strength.     Left shoulder: No tenderness or bony tenderness. Normal range of motion. Normal strength.     Right upper arm: Tenderness present. No swelling, deformity or bony tenderness.     Left upper arm: Normal. No bony tenderness.     Right elbow: Effusion present. Decreased range of motion. Tenderness present in radial head.     Left elbow: Normal.     Right forearm: Normal.     Left forearm: Normal.     Right wrist: Normal.     Left wrist: Normal.     Right hand: Normal.     Left hand: Normal.     Cervical back: Spasms and tenderness present. No bony tenderness.     Thoracic back: Spasms and tenderness present. No bony tenderness.     Lumbar back: No tenderness or bony tenderness.       Back:     Comments: No scaphoid tenderness of the right hand.  Tenderness palpation over the proximal biceps, with no ecchymosis or erythema  Skin:    Capillary Refill: Capillary refill takes less than 2 seconds.     Findings: Abrasion present.       Neurological:     General: No focal deficit present.     Mental Status: She is oriented to person, place, and time.     Sensory: Sensation is intact.     Motor: Motor function is intact.     Coordination: Coordination is intact.     ED Results / Procedures / Treatments   Labs (all labs ordered are listed, but only abnormal results are displayed) Labs Reviewed -  No data to display  EKG None  Radiology DG Shoulder Right  Result Date: 07/19/2020 CLINICAL DATA:  Recent fall with right shoulder pain, initial encounter EXAM: RIGHT SHOULDER - 2+ VIEW COMPARISON:  None. FINDINGS: There is no evidence of fracture or dislocation. There is no evidence of arthropathy or other focal bone abnormality. Soft tissues are unremarkable. IMPRESSION: No acute abnormality noted. Electronically Signed   By: Inez Catalina M.D.   On: 07/19/2020 18:59   DG Elbow Complete Right  Result Date: 07/19/2020 CLINICAL DATA:  Recent fall with elbow pain, initial encounter EXAM: RIGHT ELBOW - COMPLETE 3+ VIEW COMPARISON:  None. FINDINGS: There is a mildly depressed radial head fracture identified. Joint effusion is seen. No other focal abnormality is noted. IMPRESSION: Mildly depressed radial head fracture with associated joint effusion. Electronically Signed   By: Inez Catalina M.D.   On: 07/19/2020 19:00   DG HIP UNILAT WITH PELVIS 2-3 VIEWS RIGHT  Result Date: 07/19/2020 CLINICAL DATA:  Recent fall with right hip pain, initial encounter EXAM: DG HIP (WITH OR WITHOUT PELVIS) 2-3V RIGHT COMPARISON:  07/23/2017 FINDINGS: There is no evidence of hip fracture or dislocation. There is no evidence of arthropathy or other focal bone abnormality. IMPRESSION: No acute abnormality Electronically Signed   By: Inez Catalina M.D.   On: 07/19/2020 19:01    Procedures Procedures (including critical care time)  Medications Ordered in ED Medications  ketorolac (TORADOL) 15 MG/ML injection 15 mg (has no administration in time range)  methocarbamol (ROBAXIN) tablet 500 mg (has no administration in time range)    ED Course  I have reviewed the triage vital signs and the nursing notes.  Pertinent labs & imaging results that were available during my care of the patient were reviewed by me and considered in my medical decision making (see chart  for details).    MDM Rules/Calculators/A&P                          Patient presents 1 week after fall secondary to being pulled to the ground by her dog.   Patient hypertensive on intake 160/80.  Vital signs otherwise reassuring.  X-ray of the right hip was negative for fracture or dislocation.  X-ray of the right shoulder negative for fracture dislocation.  Xray right elbow with mildly depressed radial head fracture with associated joint effusion.    Physical exam with tenderness palpation over the right radial head.  Otherwise reassuring, neuro exam normal. No indication for imaging of the head at this time, patient is 1 week out from incident and is not on anticoagulation.  Fracture visualized on x-ray of the right elbow: will place right arm in sugar tong splint, and sling.  Patient neurovascularly intact following placement of splint.  Will refer to hand surgeon.  Will discharge home with ibuprofen, Robaxin for spasm trapezius, few pills of Norco for fracture pain.  She may receive Toradol here prior to discharge.  At this time I do not feel any further work-up is necessary in the emergency department.  Jimesha voiced understanding of her evaluation and treatment plan.  Each of her questions were answered to her expressed satisfaction.  Strict return precautions were given.  Patient stable for discharge.  Final Clinical Impression(s) / ED Diagnoses Final diagnoses:  Fall    Rx / DC Orders ED Discharge Orders         Ordered    methocarbamol (ROBAXIN) 500 MG tablet  2 times daily        07/19/20 2334    HYDROcodone-acetaminophen (NORCO/VICODIN) 5-325 MG tablet  Every 4 hours PRN        07/19/20 2334    ibuprofen (ADVIL) 600 MG tablet  Every 6 hours PRN        07/19/20 2334           Mikella Linsley, Gypsy Balsam, PA-C 07/19/20 2346    Charlesetta Shanks, MD 07/20/20 2156

## 2020-07-19 NOTE — Discharge Instructions (Addendum)
You were seen in the emergency department today and evaluated for your right elbow and hip pain.  X-rays of your right hip and shoulder did not show any dislocation or fracture.  Unfortunately x-ray of your right elbow-year-old a fracture of the head of your radius bone (a component of your elbow).  You have been placed in a splint to immobilize your elbow joint, as well as a sling.  This will likely result in improvement in your pain, as you will no longer be moving the area that is fractured.  Below is the information for the hand surgeon, Dr. Roseanne Kaufman.  Please call in the morning and inform them that you need to schedule an appointment for an emergency room follow-up.  They should be able to view your records from this evening's visit.  I have discharged you with 3 medications.  The first is high dose ibuprofen, you only need 1 of these tablets at a time.  The second is Robaxin, which is a muscle relaxer; it is important that you do not drive or operate heavy machinery while you are taking this medication, as it can make you very sleepy. The third medication is called Norco, it is a combination medication including narcotic pain medication called hydrocodone and Tylenol.  It is also important that you do not drive while you are taking this medications, as it can make you sleepy as well.  You may follow-up with your primary care doctor for your other injuries.  Please return to [place] immediately if you develop any new numbness, tingling or weakness in your right hand, or any other new severe symptoms.

## 2020-08-02 ENCOUNTER — Other Ambulatory Visit: Payer: Self-pay

## 2020-08-02 ENCOUNTER — Ambulatory Visit: Payer: Self-pay | Attending: Family Medicine | Admitting: *Deleted

## 2020-08-02 ENCOUNTER — Encounter: Payer: Self-pay | Admitting: *Deleted

## 2020-08-02 DIAGNOSIS — R278 Other lack of coordination: Secondary | ICD-10-CM | POA: Insufficient documentation

## 2020-08-02 DIAGNOSIS — R600 Localized edema: Secondary | ICD-10-CM | POA: Insufficient documentation

## 2020-08-02 DIAGNOSIS — M6281 Muscle weakness (generalized): Secondary | ICD-10-CM | POA: Insufficient documentation

## 2020-08-02 DIAGNOSIS — M25621 Stiffness of right elbow, not elsewhere classified: Secondary | ICD-10-CM | POA: Insufficient documentation

## 2020-08-02 DIAGNOSIS — M25521 Pain in right elbow: Secondary | ICD-10-CM | POA: Insufficient documentation

## 2020-08-02 NOTE — Patient Instructions (Addendum)
WEARING SCHEDULE:  Wear splint at ALL times except for hygiene care (May remove splint for exercises and then immediately place back on asf directed by the therapist).  Remove 4-6 times a day for exercises.  PURPOSE:  To prevent movement and for protection until injury can heal  CARE OF SPLINT:  Keep splint away from heat sources including: stove, radiator or furnace, or a car in sunlight. The splint can melt and will no longer fit you properly  Keep away from pets and children  Clean the splint with rubbing alcohol or spot lean with luke warm water and soap as needed. Make sure splint and your arm is completely dry before putting it back on. * During this time, make sure you also clean your hand/arm as instructed by your therapist and/or perform dressing changes as needed. Then dry hand/arm completely before replacing splint. (When cleaning hand/arm, keep it immobilized in same position until splint is replaced)  PRECAUTIONS/POTENTIAL PROBLEMS: *If you notice or experience increased pain, swelling, numbness, or a lingering reddened area from the splint: Contact your therapist immediately by calling [phone]. You must wear the splint for protection, but we will get you scheduled for adjustments as quickly as possible.  (If only straps or hooks need to be replaced and NO adjustments to the splint need to be made, just call the office ahead and let them know you are coming in)  If you have any medical concerns or signs of infection, please call your doctor immediately   Do the following exercises as issued & shown to you by Gertie Fey, PA-C at your last doctors appointment. Try doing both of these exercises lying down on your back vs sitting up in a chair. Make sure you stretch your shoulder and do all of the exercises that you were shown at Dr Angus Palms office.  Flexion (Active)    Thumb up, rest elbow on other hand. Bend as far as possible. Hold __5__ seconds. Repeat  __15-20__ times. Do _4-6___ sessions per day.   Extension (Active)    Lying on back or sitting, raise elbow up and out. Straighten arm without moving shoulder. Can use other hand to hold upper arm steady. Hold __5__ seconds. Repeat __15-20__ times. Do __4-6__ sessions per day.     Extension (Active With Finger Extension)    With forearm on table and wrist over edge, lift hand with fingers straight. Hold __3-5__ seconds. Repeat _10___ times. Do __4-6__ sessions per day.  Copyright  VHI. All rights reserved.

## 2020-08-02 NOTE — Therapy (Signed)
[facility] [address] [place], [place], [address] Phone: [phone]   Fax:  [phone]  Occupational Therapy Evaluation  Patient Details  Name: [name] MRN: [id number] Date of Birth: 1966/09/07 Referring Provider (OT): Dr Caralyn Guile  Gertie Fey, [place])   Encounter Date: 08/02/2020   OT End of Session - 08/02/20 1313    Visit Number 1    Number of Visits 3    Date for OT Re-Evaluation 08/30/20    Authorization Type Self Pay    OT Start Time 0845    OT Stop Time 1012    OT Time Calculation (min) 87 min    Activity Tolerance Patient tolerated treatment well;No increased pain    Behavior During Therapy WFL for tasks assessed/performed           Past Medical History:  Diagnosis Date  . Anxiety    At age of 37  . Gestational diabetes 18   when pregnant with daughter   . Hypertension 1999   At age of 27    Past Surgical History:  Procedure Laterality Date  . ABDOMINAL HYSTERECTOMY      There were no vitals filed for this visit.   Subjective Assessment - 08/02/20 0848    Subjective  Pt reports that she was taking her dog outside on 07/12/2020 and he pulled her and she fell down. She had pain in shoulder, hip and elbow. A week later she fell again when her dog pulled her and she went to ER where she was dx with a R elbow radial neck fracture non-displaced. She was casted for 10 days and it was removed last Friday. She has a sling and sugar-tong splint right. She presents today for long arm splint per Gertie Fey, PA-C    Pertinent History Anxiety, HTN, Type 2 diabetes controlled, former smoker, left carotid stenosis, SIRS, hyperlipiedemia, poor dentition, h/o ETOH abuse, kidney injury. Please refer to pt chart for complete PMH.    Patient Stated Goals "I want my arm back"  "I hope it will heal without surgery b/c I don't have insurance"    Currently in Pain? Yes    Pain Score 2     Pain Location  Elbow    Pain Orientation Right    Pain Descriptors / Indicators Aching;Sore    Pain Type Acute pain    Pain Onset 1 to 4 weeks ago    Pain Frequency Intermittent    Aggravating Factors  Movement, A/ROM    Pain Relieving Factors Pt wearing sling, biofreeze, pain medication, wearing ER supgar-tong splint    Multiple Pain Sites No             [facility] OT Assessment - 08/02/20 0001      Assessment   Medical Diagnosis R radial head fracture non-displaced    Referring Provider (OT) Dr Leta Jungling, PA-C   Onset Date/Surgical Date --   DOI 07/12/2020, went to ER on 07/19/2020 for dx   Hand Dominance Right    Next MD Visit 08/16/2020   3:30pm     Precautions   Precautions Other (comment)   Protectve splinting   Precaution Comments --   As per Radial neck fracture/protocol   Required Braces or Orthoses Sling      Restrictions   Weight Bearing Restrictions Yes    RUE Weight Bearing Non weight bearing      Balance Screen   Has the patient fallen in the  past 6 months Yes    How many times? 1    Has the patient had a decrease in activity level because of a fear of falling?  Yes    Is the patient reluctant to leave their home because of a fear of falling?  No      Home  Environment   Family/patient expects to be discharged to: Private residence    Living Arrangements Spouse/significant other    Type of [place]    Additional Comments Pt reports that significant other is assisting with ADL's PRN. 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 Significant other & family assist with opening and/lifting objects etc        Mobility   Mobility Status Independent        Written Expression   Dominant Hand Right      Vision - History   Baseline Vision Wears glasses all the time      Cognition   Overall Cognitive Status Within Functional Limits for tasks assessed      Observation/Other Assessments   Observations Pt reports wearing sugar tong splint as issued in ER for comfort. Stated MD office "just put me in my spling, no splint"   Pt presents in sling right UE and sugar tong splint from ER     Sensation   Light Touch Appears Intact   Pt reports ocassional paresthesias in thumb right     Coordination   Gross Motor Movements are Fluid and Coordinated No    Fine Motor Movements are Fluid and Coordinated No   stiffness of finger joints noted as pt re-applied sugar tong     Edema   Edema Pt has Min edema noted right hand upon visual observation in clinic as compared to non injured UE. Pt is also w/o edema noted right forearm or elbow at this time.      ROM / Strength   AROM / PROM / Strength AROM;PROM;Strength      AROM   Overall AROM  Deficits;Unable to assess      PROM   Overall PROM  Deficits;Unable to assess      Strength   Overall Strength Deficits;Unable to assess      Hand Function   Comment Pt has functional A/ROM with some stiffness noted at MCP/PIP/DIP due to pt wearing sugar tong splint afte rit was d/c'd by MD office. Pt stated that she applied it for protection and comfort within sling as issued by MD office. Pt was advised that MD/PA-C most likely discharged the use of this so she didn't get stiff or contractures.                    OT Treatments/Exercises (OP) - 08/02/20 0001      ADLs   ADL Comments Pt was educated in splint use, care and precautions as well as ADL's. She reports getting assistance from family/significant other for ADL's and homemaking & work related tasks PRN as she cleans homes for a living.      Splinting   Splinting Pt sling  was removed in clinic and a custom long arm splint was fabricated for  her right UE placing her right elbow at ~90* and her forearm in neutral position. Her wrist and digits are free for active ROM within the splint. She was educated in splinting use, care and precautions and she was educated to continue her HEP as issued by Gertie Fey, PA-C in Dr Angus Palms office to include active elbow flexion, extension, active shouler flexion and extension as well as ADD/ABD, finger/tendon glides. Pt verbalized understanding of all of the above in the clinic today.                 OT Education - 08/02/20 1310    Education Details Splint use, care and precautions right dominant elbow/forearm. Continue HEP as educated by Dr Angus Palms office and reviewed in the clinic today. Pt will f/u w/ MD office on 08/16/2020 and they will progress HEP. OT for splint and 1 f/u appointment/splint check PRN.    Person(s) Educated Patient    Methods Explanation;Demonstration;Verbal cues;Handout    Comprehension Verbalized understanding;Returned demonstration            OT Short Term Goals - 08/02/20 1325      OT SHORT TERM GOAL #1   Title STG = LTG's             OT Long Term Goals - 08/02/20 1326      OT LONG TERM GOAL #1   Title Pt will be Mod I splinting use, care and precautions right UE following radial head fracture.    Time 4    Period Weeks    Status New    Target Date 08/30/20      OT LONG TERM GOAL #2   Title Pt will be Mod I initial HEP as issued by MD office and reviewed in clinic setting (see pt handout).    Time 4    Period Weeks    Status New    Target Date 08/30/20                 Plan - 08/02/20 1314    Clinical Impression Statement Pt is a pleasant 54 y/o R HD female s/p fall while walking her dog resulting in a right radial head fracture. DOI: 07/12/2020, Date of DX: 07/19/2020. She presents to out-pt OT clinic today for long arm custom splinting as well as pt education and 1-2 additional f/u visits. 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 Her PMH includes but is not limited to: HTN, Type 2 diabetes, controlled, anxiety, former smoker, left carotid stenosis, SIRS, hyperlipidemia, ETOH abuse, kidney injury. Please refer to pt chart for complete medical history. She currently has deficits in areas of A/ROM and functional use of her R dominant UE. She will f/u on 08/16/2020 with Dr Angus Palms office for further progression of HEP. She has 1 f/u visit for splint check and adjustment next week. She will be d/c from out-pt OT at that time should there not be any other concerns.    OT Occupational Profile and History Problem Focused Assessment - Including review of records relating to presenting problem    Occupational performance deficits (Please refer to evaluation for details): ADL's;Work    Body Structure / Function / Physical Skills  ADL;Strength;Pain;UE functional use;ROM;Flexibility;Coordination;Decreased knowledge of precautions;FMC    Rehab Potential Good    Clinical Decision Making Limited treatment options, no task modification necessary    Comorbidities Affecting Occupational Performance: May have comorbidities impacting occupational performance    Modification or Assistance to Complete Evaluation  No modification of tasks or assist necessary to complete eval    OT Frequency Other (comment)   Eval +1-2 additional visits for splinting check and adjustments   OT Duration 4 weeks    OT Treatment/Interventions Splinting;Patient/family education;Therapeutic exercise    Plan Right long arm splint check and adjustment followed by d/c to MD care for further progression of HEP per MD office & pt request.    Consulted and Agree with Plan of Care Patient           WEARING SCHEDULE:  Wear splint at ALL times except for hygiene  care (May remove splint for exercises and then immediately place back on asf directed by the therapist).  Remove 4-6 times a day for exercises.  PURPOSE:  To prevent movement and for protection until injury can heal  CARE OF SPLINT:  Keep splint away from heat sources including: stove, radiator or furnace, or a car in sunlight. The splint can melt and will no longer fit you properly  Keep away from pets and children  Clean the splint with rubbing alcohol or spot lean with luke warm water and soap as needed. Make sure splint and your arm is completely dry before putting it back on. * During this time, make sure you also clean your hand/arm as instructed by your therapist and/or perform dressing changes as needed. Then dry hand/arm completely before replacing splint. (When cleaning hand/arm, keep it immobilized in same position until splint is replaced)  PRECAUTIONS/POTENTIAL PROBLEMS: *If you notice or experience increased pain, swelling, numbness, or a lingering reddened area from the splint: Contact your therapist immediately by calling [phone]. You must wear the splint for protection, but we will get you scheduled for adjustments as quickly as possible.  (If only straps or hooks need to be replaced and NO adjustments to the splint need to be made, just call the office ahead and let them know you are coming in)  If you have any medical concerns or signs of infection, please call your doctor immediately   Do the following exercises as issued & shown to you by Gertie Fey, PA-C at your last doctors appointment. Try doing both of these exercises lying down on your back vs sitting up in a chair. Make sure you stretch your shoulder and do all of the exercises that you were shown at Dr Angus Palms office.  Flexion (Active)    Thumb up, rest elbow on other hand. Bend as far as possible. Hold __5__ seconds. Repeat __15-20__ times. Do _4-6___ sessions per day.   Extension  (Active)    Lying on back or sitting, raise elbow up and out. Straighten arm without moving shoulder. Can use other hand to hold upper arm steady. Hold __5__ seconds. Repeat __15-20__ times. Do __4-6__ sessions per day.     Extension (Active With Finger Extension)    With forearm on table and wrist over edge, lift hand with fingers straight. Hold __3-5__ seconds. Repeat _10___ times. Do __4-6__ sessions per day.  Copyright  VHI. All rights reserved.   Patient will benefit from skilled therapeutic intervention in order to improve the following deficits and impairments:   Body Structure / Function / Physical Skills: ADL, Strength, Pain,  UE functional use, ROM, Flexibility, Coordination, Decreased knowledge of precautions, [facility]     Visit Diagnosis: Pain in right elbow - Plan: Ot plan of care cert/re-cert  Muscle weakness (generalized) - Plan: Ot plan of care cert/re-cert  Other lack of coordination - Plan: Ot plan of care cert/re-cert  Stiffness of right elbow, not elsewhere classified - Plan: Ot plan of care cert/re-cert  Localized edema - Plan: Ot plan of care cert/re-cert    Problem List Patient Active Problem List   Diagnosis Date Noted  . Controlled type 2 diabetes mellitus without complication, with long-term current use of insulin ([place]) 09/19/2019  . Carotid stenosis, left 09/19/2019  . Acute colitis 09/15/2019  . SIRS (systemic inflammatory response syndrome) ([place]) 09/15/2019  . AKI (acute kidney injury) ([place]) 09/15/2019  . Mixed hyperlipidemia 06/04/2017  . Alcoholic hepatitis without ascites 02/23/2015  . HTN (hypertension), malignant 02/21/2015  . Current smoker 02/21/2015  . Diabetes type 2, uncontrolled ([place]) 02/21/2015  . Poor dentition 02/21/2015  . ETOH abuse 02/21/2015    Almyra Deforest, OTR/L 08/02/2020, 1:33 PM  [facility] [address] [place], [place], [address] Phone:  [phone]   Fax:  [phone]  Name: Idamae Coccia MRN: [id number] Date of Birth: 1966/07/04

## 2020-08-05 MED FILL — HYDROCHLOROTHIAZIDE 25 MG T: 25 | 30 days supply | Qty: 30 | Fill #1

## 2020-08-05 MED FILL — traZODone HCL 50 MG TABS: 50 | 30 days supply | Qty: 60 | Fill #2

## 2020-08-05 MED FILL — $TOUJEO SOLOSTAR 300 UNITS/: 300 | 30 days supply | Qty: 2 | Fill #2

## 2020-08-05 MED FILL — LISINOPRIL 40 MG TABLET: 40 | 30 days supply | Qty: 30 | Fill #1

## 2020-08-09 ENCOUNTER — Ambulatory Visit: Payer: Self-pay | Admitting: Occupational Therapy

## 2020-08-09 ENCOUNTER — Encounter: Payer: Self-pay | Admitting: Occupational Therapy

## 2020-08-09 ENCOUNTER — Other Ambulatory Visit: Payer: Self-pay

## 2020-08-09 DIAGNOSIS — M25521 Pain in right elbow: Secondary | ICD-10-CM

## 2020-08-09 DIAGNOSIS — R278 Other lack of coordination: Secondary | ICD-10-CM

## 2020-08-09 DIAGNOSIS — R600 Localized edema: Secondary | ICD-10-CM

## 2020-08-09 DIAGNOSIS — M6281 Muscle weakness (generalized): Secondary | ICD-10-CM

## 2020-08-09 DIAGNOSIS — M25621 Stiffness of right elbow, not elsewhere classified: Secondary | ICD-10-CM

## 2020-08-09 NOTE — Therapy (Signed)
[facility] [address] [place], [place], [address] Phone: [phone]   Fax:  [phone]  Occupational Therapy Treatment  Patient Details  Name: [name] MRN: [id number] Date of Birth: 1966-08-19 Referring Provider (OT): Dr Caralyn Guile  Gertie Fey, [place])   Encounter Date: 08/09/2020   OT End of Session - 08/09/20 1101    Visit Number 2    Number of Visits 3    Date for OT Re-Evaluation 08/30/20    Authorization Type Self Pay    OT Start Time 4431    OT Stop Time 1055    OT Time Calculation (min) 40 min    Activity Tolerance Patient tolerated treatment well    Behavior During Therapy [facility] for tasks assessed/performed           Past Medical History:  Diagnosis Date  . Anxiety    At age of 71  . Gestational diabetes 89   when pregnant with daughter   . Hypertension 1999   At age of 65    Past Surgical History:  Procedure Laterality Date  . ABDOMINAL HYSTERECTOMY      There were no vitals filed for this visit.   Subjective Assessment - 08/09/20 1021    Subjective  She told me to wear the sling when I clean - but that hurt my arm, so I stopped wearing it.    Pertinent History Anxiety, HTN, Type 2 diabetes controlled, former smoker, left carotid stenosis, SIRS, hyperlipiedemia, poor dentition, h/o ETOH abuse, kidney injury. Please refer to pt chart for complete PMH.    Patient Stated Goals "I want my arm back"  "I hope it will heal without surgery b/c I don't have insurance"    Currently in Pain? Yes    Pain Score 3     Pain Location Arm    Pain Orientation Right    Pain Descriptors / Indicators Aching    Pain Type Acute pain    Pain Onset 1 to 4 weeks ago    Pain Frequency Intermittent    Aggravating Factors  AROM    Pain Relieving Factors Pain medication                        OT Treatments/Exercises (OP) - 08/09/20 0001      Exercises   Exercises Elbow;Wrist      Elbow  Exercises   Other elbow exercises Reviewed elbow flexion/extension exercises as issued last week.  Patient needed min cueing for proper technique and reiterated benefit of exercising several times/day.        Splinting   Splinting Adjusted long arm splint to slightly flare distal end and add padding, and pad elbow.  Patient issued additional stockinette.                    OT Education - 08/09/20 1101    Education Details Reviewed AROM wrist and elbw    Person(s) Educated Patient    Methods Explanation;Demonstration;Verbal cues    Comprehension Verbalized understanding;Returned demonstration            OT Short Term Goals - 08/02/20 1325      OT SHORT TERM GOAL #1   Title STG = LTG's             OT Long Term Goals - 08/09/20 1103      OT LONG TERM GOAL #1   Title Pt will be Mod I splinting use,  care and precautions right UE following radial head fracture.    Time 4    Period Weeks    Status Achieved      OT LONG TERM GOAL #2   Title Pt will be Mod I initial HEP as issued by MD office and reviewed in clinic setting (see pt handout).    Time 4    Period Weeks    Status On-going                 Plan - 08/09/20 1102    Clinical Impression Statement Patient is still experiencing pain (2-3/10) with mild edema.  Patient is using pain medication sparingly.  Patient is doing AROM exercises.    OT Occupational Profile and History Problem Focused Assessment - Including review of records relating to presenting problem    Occupational performance deficits (Please refer to evaluation for details): ADL's;Work    Body Structure / Function / Physical Skills ADL;Strength;Pain;UE functional use;ROM;Flexibility;Coordination;Decreased knowledge of precautions;FMC    Rehab Potential Good    Clinical Decision Making Limited treatment options, no task modification necessary    Comorbidities Affecting Occupational Performance: May have comorbidities impacting occupational  performance    Modification or Assistance to Complete Evaluation  No modification of tasks or assist necessary to complete eval    OT Frequency Other (comment)   Eval +1-2 additional visits for splinting check and adjustments   OT Duration 4 weeks    OT Treatment/Interventions Splinting;Patient/family education;Therapeutic exercise    Plan Right long arm splint check and adjustment followed by d/c to MD care for further progression of HEP per MD office & pt request.    Consulted and Agree with Plan of Care Patient           Patient will benefit from skilled therapeutic intervention in order to improve the following deficits and impairments:   Body Structure / Function / Physical Skills: ADL, Strength, Pain, UE functional use, ROM, Flexibility, Coordination, Decreased knowledge of precautions, [facility]       Visit Diagnosis: Pain in right elbow  Muscle weakness (generalized)  Other lack of coordination  Stiffness of right elbow, not elsewhere classified  Localized edema    Problem List Patient Active Problem List   Diagnosis Date Noted  . Controlled type 2 diabetes mellitus without complication, with long-term current use of insulin ([place]) 09/19/2019  . Carotid stenosis, left 09/19/2019  . Acute colitis 09/15/2019  . SIRS (systemic inflammatory response syndrome) ([place]) 09/15/2019  . AKI (acute kidney injury) ([place]) 09/15/2019  . Mixed hyperlipidemia 06/04/2017  . Alcoholic hepatitis without ascites 02/23/2015  . HTN (hypertension), malignant 02/21/2015  . Current smoker 02/21/2015  . Diabetes type 2, uncontrolled ([place]) 02/21/2015  . Poor dentition 02/21/2015  . ETOH abuse 02/21/2015    [name], OTR/L 08/09/2020, 11:04 AM  [facility] [address] [place], [place], [address] Phone: [phone]   Fax:  [phone]  Name: [name] MRN: [id number] Date of Birth: [birth year]

## 2020-08-23 ENCOUNTER — Encounter: Payer: Self-pay | Admitting: *Deleted

## 2020-09-05 MED FILL — traZODone HCL 50 MG TABS: 50 | 30 days supply | Qty: 60 | Fill #3

## 2020-09-05 MED FILL — HYDROCHLOROTHIAZIDE 25 MG T: 25 | 30 days supply | Qty: 30 | Fill #2

## 2020-09-05 MED FILL — LISINOPRIL 40 MG TABLET: 40 | 30 days supply | Qty: 30 | Fill #2

## 2020-09-05 MED FILL — $TOUJEO SOLOSTAR 300 UNITS/: 300 | 30 days supply | Qty: 2 | Fill #3

## 2020-10-05 ENCOUNTER — Other Ambulatory Visit: Payer: Self-pay | Admitting: Physician Assistant

## 2020-10-05 DIAGNOSIS — G479 Sleep disorder, unspecified: Secondary | ICD-10-CM

## 2020-10-05 MED FILL — HYDROCHLOROTHIAZIDE 25 MG T: 25 | 30 days supply | Qty: 30 | Fill #3

## 2020-10-05 MED FILL — $TOUJEO SOLOSTAR 300 UNITS/: 300 | 30 days supply | Qty: 2 | Fill #4

## 2020-10-05 MED FILL — LISINOPRIL 40 MG TABLET: 40 | 30 days supply | Qty: 30 | Fill #3

## 2020-10-06 MED FILL — traZODone HCL 50 MG TABS: 50 | 30 days supply | Qty: 60 | Fill #0

## 2020-11-02 MED FILL — LISINOPRIL 40 MG TABLET: 40 | 30 days supply | Qty: 30 | Fill #4

## 2020-11-02 MED FILL — CARVEDILOL 3.125 MG TABLET: 3.125 | 30 days supply | Qty: 60 | Fill #1

## 2020-11-02 MED FILL — HYDROCHLOROTHIAZIDE 25 MG T: 25 | 30 days supply | Qty: 30 | Fill #4

## 2020-11-02 MED FILL — $TOUJEO SOLOSTAR 300 UNITS/: 300 | 30 days supply | Qty: 2 | Fill #5

## 2020-11-02 MED FILL — traZODone HCL 50 MG TABS: 50 | 30 days supply | Qty: 60 | Fill #1

## 2020-11-30 ENCOUNTER — Other Ambulatory Visit: Payer: Self-pay | Admitting: Physician Assistant

## 2020-11-30 DIAGNOSIS — G479 Sleep disorder, unspecified: Secondary | ICD-10-CM

## 2020-11-30 DIAGNOSIS — Z794 Long term (current) use of insulin: Secondary | ICD-10-CM

## 2020-11-30 DIAGNOSIS — E119 Type 2 diabetes mellitus without complications: Secondary | ICD-10-CM

## 2020-11-30 MED FILL — CARVEDILOL 3.125 MG TABLET: 3.125 | 30 days supply | Qty: 60 | Fill #2

## 2020-11-30 MED FILL — HYDROCHLOROTHIAZIDE 25 MG T: 25 | 30 days supply | Qty: 30 | Fill #5

## 2020-11-30 MED FILL — LISINOPRIL 40 MG TAB: 40 | 30 days supply | Qty: 30 | Fill #5

## 2020-11-30 NOTE — Telephone Encounter (Signed)
Pt has made appointment for 01/04/21. Pt needs refills of her meds to get her through to appt.

## 2020-11-30 NOTE — Telephone Encounter (Signed)
Requested medication (s) are due for refill today: yes  Requested medication (s) are on the active medication list: yes  Last refill: 11/02/2020  Future visit scheduled:  no  Notes to clinic: overdue for follow up Message has been sent for patient to contact office    Requested Prescriptions  Pending Prescriptions Disp Refills   TOUJEO SOLOSTAR 300 UNIT/ML Solostar Pen [Pharmacy Med Name: TOUJEO SOLOSTAR 300 UNITS/ 300 Solution Pen-injector] 1.5 mL 5    Sig: Inject 15 Units into the skin daily.      Endocrinology:  Diabetes - Insulins Failed - 11/30/2020 10:56 AM      Failed - HBA1C is between 0 and 7.9 and within 180 days    Hemoglobin A1C  Date Value Ref Range Status  06/02/2020 5.5 4.0 - 5.6 % Final  09/01/2019 5.2 (NE)  Final          Failed - Valid encounter within last 6 months    Recent Outpatient Visits           6 months ago Controlled type 2 diabetes mellitus without complication, with long-term current use of insulin [facility])   [place], [place], [place]   1 year ago Controlled type 2 diabetes mellitus without complication, with long-term current use of insulin ([place])   [place], [place], MD   1 year ago Carotid stenosis, left   [place] Fulp, [place], MD   2 years ago Controlled type 2 diabetes mellitus without complication, with long-term current use of insulin ([place])   [place] Fulp, [place], MD   3 years ago Controlled type 2 diabetes mellitus without complication, with long-term current use of insulin ([place])   [place], [place], FNP                  traZODone (DESYREL) 50 MG tablet [Pharmacy Med Name: traZODone HCL 50 MG TABS 50 Tablet] 60 tablet 1    Sig: TAKE 1 TO 2 TABLETS BY MOUTH AT BEDTIME AS NEEDED FOR INSOMNIA      Psychiatry: Antidepressants - Serotonin Modulator  Failed - 11/30/2020 10:56 AM      Failed - Valid encounter within last 6 months    Recent Outpatient Visits           6 months ago Controlled type 2 diabetes mellitus without complication, with long-term current use of insulin [facility])   [place], [place], [place]   1 year ago Controlled type 2 diabetes mellitus without complication, with long-term current use of insulin ([place])   [place] Fulp, [place], MD   1 year ago Carotid stenosis, left   [place], [place], MD   2 years ago Controlled type 2 diabetes mellitus without complication, with long-term current use of insulin ([place])   [place] Fulp, [place], MD   3 years ago Controlled type 2 diabetes mellitus without complication, with long-term current use of insulin [facility])   [place], [place]

## 2020-12-01 ENCOUNTER — Other Ambulatory Visit: Payer: Self-pay | Admitting: Family Medicine

## 2020-12-01 MED FILL — $TOUJEO SOLOSTAR 300 UNITS/: 300 | 30 days supply | Qty: 2 | Fill #0

## 2020-12-01 MED FILL — traZODone HCL 50 MG TABS: 50 | 30 days supply | Qty: 60 | Fill #0

## 2021-01-04 ENCOUNTER — Other Ambulatory Visit: Payer: Self-pay

## 2021-01-04 ENCOUNTER — Encounter: Payer: Self-pay | Admitting: Physician Assistant

## 2021-01-04 ENCOUNTER — Ambulatory Visit: Payer: 59 | Attending: Physician Assistant | Admitting: Physician Assistant

## 2021-01-04 ENCOUNTER — Other Ambulatory Visit: Payer: Self-pay | Admitting: Physician Assistant

## 2021-01-04 DIAGNOSIS — E782 Mixed hyperlipidemia: Secondary | ICD-10-CM

## 2021-01-04 DIAGNOSIS — Z794 Long term (current) use of insulin: Secondary | ICD-10-CM

## 2021-01-04 DIAGNOSIS — E119 Type 2 diabetes mellitus without complications: Secondary | ICD-10-CM | POA: Diagnosis not present

## 2021-01-04 DIAGNOSIS — I10 Essential (primary) hypertension: Secondary | ICD-10-CM

## 2021-01-04 DIAGNOSIS — G479 Sleep disorder, unspecified: Secondary | ICD-10-CM | POA: Diagnosis not present

## 2021-01-04 DIAGNOSIS — E1165 Type 2 diabetes mellitus with hyperglycemia: Secondary | ICD-10-CM

## 2021-01-04 DIAGNOSIS — IMO0002 Reserved for concepts with insufficient information to code with codable children: Secondary | ICD-10-CM

## 2021-01-04 MED ORDER — INSULIN GLARGINE (1 UNIT DIAL) 300 UNIT/ML ~~LOC~~ SOPN
PEN_INJECTOR | SUBCUTANEOUS | 5 refills | Status: DC
  Filled 2021-01-04: qty 1.5, 30d supply, fill #0
  Filled 2021-01-31: qty 1.5, 30d supply, fill #1
  Filled 2021-03-02: qty 1.5, 30d supply, fill #2
  Filled 2021-03-30: qty 1.5, 30d supply, fill #3
  Filled 2021-04-27 (×2): qty 3, 60d supply, fill #4

## 2021-01-04 MED ORDER — TRUE METRIX BLOOD GLUCOSE TEST VI STRP
1.0000 | ORAL_STRIP | Freq: Three times a day (TID) | 11 refills | Status: DC
  Filled 2021-01-04: qty 100, 34d supply, fill #0

## 2021-01-04 MED ORDER — CARVEDILOL 3.125 MG PO TABS
ORAL_TABLET | ORAL | 1 refills | Status: DC
  Filled 2021-01-04: qty 180, 90d supply, fill #0
  Filled 2021-01-04: qty 60, 30d supply, fill #0
  Filled 2021-05-30: qty 180, 90d supply, fill #1

## 2021-01-04 MED ORDER — LISINOPRIL 40 MG PO TABS
ORAL_TABLET | ORAL | 1 refills | Status: DC
  Filled 2021-01-04: qty 30, 30d supply, fill #0
  Filled 2021-01-31: qty 30, 30d supply, fill #1
  Filled 2021-03-02: qty 30, 30d supply, fill #2
  Filled 2021-03-30: qty 30, 30d supply, fill #3
  Filled 2021-04-27: qty 30, 30d supply, fill #4
  Filled 2021-05-30: qty 30, 30d supply, fill #5

## 2021-01-04 MED ORDER — TRAZODONE HCL 50 MG PO TABS
ORAL_TABLET | ORAL | 3 refills | Status: DC
  Filled 2021-01-04: qty 60, 30d supply, fill #0
  Filled 2021-01-31: qty 60, 30d supply, fill #1
  Filled 2021-03-02: qty 60, 30d supply, fill #2
  Filled 2021-03-30: qty 60, 30d supply, fill #3

## 2021-01-04 MED ORDER — TRUE METRIX METER W/DEVICE KIT
1.0000 | PACK | 0 refills | Status: AC | PRN
  Filled 2021-01-04: qty 1, 30d supply, fill #0

## 2021-01-04 MED ORDER — BD PEN NEEDLE SHORT U/F 31G X 8 MM MISC
1.0000 "application " | Freq: Every day | 3 refills | Status: DC
  Filled 2021-01-04: qty 100, 90d supply, fill #0

## 2021-01-04 MED ORDER — AMLODIPINE BESYLATE 10 MG PO TABS
10.0000 mg | ORAL_TABLET | Freq: Every day | ORAL | 1 refills | Status: DC
  Filled 2021-01-04: qty 30, 30d supply, fill #0
  Filled 2021-05-30: qty 30, 30d supply, fill #1

## 2021-01-04 MED ORDER — TRUEPLUS LANCETS 28G MISC
1.0000 | Freq: Three times a day (TID) | 11 refills | Status: AC
  Filled 2021-01-04: qty 100, 25d supply, fill #0

## 2021-01-04 MED ORDER — ROSUVASTATIN CALCIUM 10 MG PO TABS
20.0000 mg | ORAL_TABLET | Freq: Every day | ORAL | 1 refills | Status: DC
  Filled 2021-01-04: qty 60, 30d supply, fill #0

## 2021-01-04 MED ORDER — HYDROCHLOROTHIAZIDE 25 MG PO TABS
ORAL_TABLET | ORAL | 1 refills | Status: DC
  Filled 2021-01-04: qty 30, 30d supply, fill #0
  Filled 2021-01-31: qty 30, 30d supply, fill #1
  Filled 2021-03-02: qty 30, 30d supply, fill #2
  Filled 2021-03-30: qty 30, 30d supply, fill #3
  Filled 2021-04-27: qty 30, 30d supply, fill #4
  Filled 2021-05-30: qty 30, 30d supply, fill #5

## 2021-01-04 NOTE — Progress Notes (Signed)
Virtual Visit via Telephone Note  I connected with [name] on 01/04/21 at  8:30 AM EDT by telephone and verified that I am speaking with the correct person using two identifiers.  Location: Patient:  office Provider: [facility] office   I discussed the limitations, risks, security and privacy concerns of performing an evaluation and management service by telephone and the availability of in person appointments. I also discussed with the patient that there may be a patient responsible charge related to this service. The patient expressed understanding and agreed to proceed.   History of Present Illness: [name] needs med RF.  Her glucometer seems to be malfunctioning.  We have discussed ways to trouble shoot it during the visit and if she is unable to get it to work, I have sent her a new one.  Blood sugars running 90-175 at the highest.  Denies polyuria/polydipsia.  She checks BP occasionally out of the office and it is usu 120-130/70-80s   Observations/Objective:  NAD.  A&Ox3   Assessment and Plan: 1. Essential hypertension Controlled based on OOO readings - amLODipine (NORVASC) 10 MG tablet; Take 1 tablet (10 mg total) by mouth at bedtime. To lower blood pressure  Dispense: 90 tablet; Refill: 1 - carvedilol (COREG) 3.125 MG tablet; TAKE 1 TABLET (3.125 MG TOTAL) BY MOUTH 2 (TWO) TIMES DAILY WITH A MEAL. TO LOWER BLOOD PRESSURE  Dispense: 180 tablet; Refill: 1 - hydrochlorothiazide (HYDRODIURIL) 25 MG tablet; TAKE 1 TABLET (25 MG TOTAL) BY MOUTH DAILY. TO LOWER BLOOD PRESSURE  Dispense: 90 tablet; Refill: 1 - Comprehensive metabolic panel - CBC with Differential/Platelet  2. Controlled type 2 diabetes mellitus without complication, with long-term current use of insulin ([place]) Not at goal based on OOO readings but will make adjustments if needed after labs are back.  Last A1C=5.5 06/2020 - glucose blood (TRUE METRIX BLOOD GLUCOSE TEST) test strip; 1 each by Other route 3 (three) times  daily.  Dispense: 100 each; Refill: 11 - Insulin Pen Needle (B-D ULTRAFINE III SHORT PEN) 31G X 8 MM MISC; 1 application by Does not apply route daily.  Dispense: 90 each; Refill: 3 - rosuvastatin (CRESTOR) 10 MG tablet; Take 2 tablets (20 mg total) by mouth daily.  Dispense: 180 tablet; Refill: 1 - TRUEplus Lancets 28G MISC; 1 each by Does not apply route 3 (three) times daily.  Dispense: 100 each; Refill: 11 - insulin glargine, 1 Unit Dial, (TOUJEO) 300 UNIT/ML Solostar Pen; INJECT 15 UNITS INTO THE SKIN DAILY.  Dispense: 1.5 mL; Refill: 5 - Hemoglobin A1c - Comprehensive metabolic panel  3. Mixed hyperlipidemia - rosuvastatin (CRESTOR) 10 MG tablet; Take 2 tablets (20 mg total) by mouth daily.  Dispense: 180 tablet; Refill: 1 - Lipid panel  4. Uncontrolled hypertension See #1 - lisinopril (ZESTRIL) 40 MG tablet; TAKE 1 TABLET (40 MG TOTAL) BY MOUTH DAILY. TO LOWER BLOOD PRESSURE  Dispense: 90 tablet; Refill: 1  5. Sleeping difficulty Working well - traZODone (DESYREL) 50 MG tablet; TAKE 1 TO 2 TABLETS BY MOUTH AT BEDTIME AS NEEDED FOR INSOMNIA  Dispense: 60 tablet; Refill: 3  6. Diabetes type 2, uncontrolled ([place]) See #2 - Blood Glucose Monitoring Suppl (TRUE METRIX METER) w/Device KIT; 1 each by Does not apply route as needed.  Dispense: 1 kit; Refill: 0    Follow Up Instructions: 3 months with her PCP for DM and htn   I discussed the assessment and treatment plan with the patient. The patient was provided an opportunity to ask  questions and all were answered. The patient agreed with the plan and demonstrated an understanding of the instructions.   The patient was advised to call back or seek an in-person evaluation if the symptoms worsen or if the condition fails to improve as anticipated.  I provided 14 minutes of non-face-to-face time during this encounter.   Alexandra Caldron, PA-C  Patient ID: [name], female   DOB: 1966-07-02, 55 y.o.   MRN: [id number]

## 2021-01-04 NOTE — Telephone Encounter (Signed)
Requested medication (s) are due for refill today:  Ordered today by Freeman Caldron, PA-C  Requested medication (s) are on the active medication list:   Yes  Future visit scheduled:   No   Last ordered: Today 4/6.   True Metrix not covered by her insurance.   Requesting an alternative.   Requested Prescriptions  Pending Prescriptions Disp Refills   glucose blood (TRUE METRIX BLOOD GLUCOSE TEST) test strip 100 each 11    Sig: 1 each by Other route 3 (three) times daily.      Endocrinology: Diabetes - Testing Supplies Passed - 01/04/2021 12:47 PM      Passed - Valid encounter within last 12 months    Recent Outpatient Visits           Today Essential hypertension   [facility] [place], [place], [place]   7 months ago Controlled type 2 diabetes mellitus without complication, with long-term current use of insulin [facility])   [place], [place], [place]   1 year ago Controlled type 2 diabetes mellitus without complication, with long-term current use of insulin ([place])   [facility] [place], [place], [place]   1 year ago Carotid stenosis, left   [facility] [place], [place], [place]   2 years ago Controlled type 2 diabetes mellitus without complication, with long-term current use of insulin ([place])   [facility] [place], [place], [place]

## 2021-01-05 ENCOUNTER — Other Ambulatory Visit: Payer: Self-pay

## 2021-01-05 LAB — CBC WITH DIFFERENTIAL/PLATELET
Basophils Absolute: 0.1 10*3/uL (ref 0.0–0.2)
Basos: 1 %
EOS (ABSOLUTE): 0.1 10*3/uL (ref 0.0–0.4)
Eos: 2 %
Hematocrit: 35.5 % (ref 34.0–46.6)
Hemoglobin: 12.4 g/dL (ref 11.1–15.9)
Immature Grans (Abs): 0 10*3/uL (ref 0.0–0.1)
Immature Granulocytes: 0 %
Lymphocytes Absolute: 2.5 10*3/uL (ref 0.7–3.1)
Lymphs: 40 %
MCH: 32.3 pg (ref 26.6–33.0)
MCHC: 34.9 g/dL (ref 31.5–35.7)
MCV: 92 fL (ref 79–97)
Monocytes Absolute: 0.5 10*3/uL (ref 0.1–0.9)
Monocytes: 8 %
Neutrophils Absolute: 3 10*3/uL (ref 1.4–7.0)
Neutrophils: 49 %
Platelets: 225 10*3/uL (ref 150–450)
RBC: 3.84 x10E6/uL (ref 3.77–5.28)
RDW: 12.8 % (ref 11.7–15.4)
WBC: 6.1 10*3/uL (ref 3.4–10.8)

## 2021-01-05 LAB — HEMOGLOBIN A1C
Est. average glucose Bld gHb Est-mCnc: 128 mg/dL
Hgb A1c MFr Bld: 6.1 % — ABNORMAL HIGH (ref 4.8–5.6)

## 2021-01-05 LAB — LIPID PANEL
Chol/HDL Ratio: 5.5 ratio — ABNORMAL HIGH (ref 0.0–4.4)
Cholesterol, Total: 238 mg/dL — ABNORMAL HIGH (ref 100–199)
HDL: 43 mg/dL (ref >39)
LDL Chol Calc (NIH): 162 mg/dL — ABNORMAL HIGH (ref 0–99)
Triglycerides: 180 mg/dL — ABNORMAL HIGH (ref 0–149)
VLDL Cholesterol Cal: 33 mg/dL (ref 5–40)

## 2021-01-05 LAB — COMPREHENSIVE METABOLIC PANEL
ALT: 50 IU/L — ABNORMAL HIGH (ref 0–32)
AST: 39 IU/L (ref 0–40)
Albumin/Globulin Ratio: 2 (ref 1.2–2.2)
Albumin: 4.5 g/dL (ref 3.8–4.9)
Alkaline Phosphatase: 59 IU/L (ref 44–121)
BUN/Creatinine Ratio: 23 (ref 9–23)
BUN: 14 mg/dL (ref 6–24)
Bilirubin Total: 0.4 mg/dL (ref 0.0–1.2)
CO2: 21 mmol/L (ref 20–29)
Calcium: 8.9 mg/dL (ref 8.7–10.2)
Chloride: 99 mmol/L (ref 96–106)
Creatinine, Ser: 0.62 mg/dL (ref 0.57–1.00)
Globulin, Total: 2.2 g/dL (ref 1.5–4.5)
Glucose: 96 mg/dL (ref 65–99)
Potassium: 4.1 mmol/L (ref 3.5–5.2)
Sodium: 140 mmol/L (ref 134–144)
Total Protein: 6.7 g/dL (ref 6.0–8.5)
eGFR: 106 mL/min/{1.73_m2} (ref >59)

## 2021-01-05 MED ORDER — TRUE METRIX BLOOD GLUCOSE TEST VI STRP
1.0000 | ORAL_STRIP | Freq: Three times a day (TID) | 11 refills | Status: DC
  Filled 2021-01-05: qty 100, 34d supply, fill #0

## 2021-01-06 ENCOUNTER — Other Ambulatory Visit: Payer: Self-pay

## 2021-01-11 ENCOUNTER — Other Ambulatory Visit: Payer: Self-pay | Admitting: Physician Assistant

## 2021-01-11 ENCOUNTER — Other Ambulatory Visit: Payer: Self-pay

## 2021-01-11 DIAGNOSIS — E782 Mixed hyperlipidemia: Secondary | ICD-10-CM

## 2021-01-11 MED ORDER — ROSUVASTATIN CALCIUM 20 MG PO TABS
20.0000 mg | ORAL_TABLET | Freq: Every day | ORAL | 3 refills | Status: DC
  Filled 2021-01-11 – 2021-05-30 (×2): qty 90, 90d supply, fill #0

## 2021-01-18 ENCOUNTER — Other Ambulatory Visit: Payer: Self-pay

## 2021-01-20 ENCOUNTER — Other Ambulatory Visit: Payer: Self-pay

## 2021-01-23 ENCOUNTER — Telehealth: Payer: Self-pay

## 2021-01-23 NOTE — Telephone Encounter (Signed)
-----   Message from [place] Donovan, [place] sent at 01/04/2021  8:39 AM EDT ----- 3 months with her PCP for DM and htn

## 2021-01-23 NOTE — Telephone Encounter (Signed)
Called Pt no answer. Left vm for Pt to call [phone] to get scheduled for an appt with any provider to establish care.

## 2021-01-30 ENCOUNTER — Other Ambulatory Visit: Payer: Self-pay

## 2021-01-31 ENCOUNTER — Other Ambulatory Visit: Payer: Self-pay

## 2021-02-01 ENCOUNTER — Other Ambulatory Visit: Payer: Self-pay

## 2021-02-20 ENCOUNTER — Other Ambulatory Visit: Payer: Self-pay

## 2021-03-02 ENCOUNTER — Other Ambulatory Visit: Payer: Self-pay

## 2021-03-03 ENCOUNTER — Other Ambulatory Visit: Payer: Self-pay

## 2021-03-30 ENCOUNTER — Other Ambulatory Visit: Payer: Self-pay

## 2021-03-31 ENCOUNTER — Other Ambulatory Visit: Payer: Self-pay

## 2021-04-11 ENCOUNTER — Other Ambulatory Visit: Payer: Self-pay

## 2021-04-27 ENCOUNTER — Other Ambulatory Visit: Payer: Self-pay

## 2021-04-27 ENCOUNTER — Other Ambulatory Visit: Payer: Self-pay | Admitting: Physician Assistant

## 2021-04-27 DIAGNOSIS — G479 Sleep disorder, unspecified: Secondary | ICD-10-CM

## 2021-04-27 MED ORDER — TRAZODONE HCL 50 MG PO TABS
ORAL_TABLET | ORAL | 3 refills | Status: DC
  Filled 2021-04-27: qty 60, 30d supply, fill #0
  Filled 2021-05-30: qty 60, 30d supply, fill #1

## 2021-05-30 ENCOUNTER — Other Ambulatory Visit: Payer: Self-pay

## 2021-05-31 ENCOUNTER — Other Ambulatory Visit: Payer: Self-pay

## 2021-06-07 ENCOUNTER — Other Ambulatory Visit: Payer: Self-pay | Admitting: Nurse Practitioner

## 2021-06-20 ENCOUNTER — Other Ambulatory Visit: Payer: Self-pay

## 2021-06-20 ENCOUNTER — Other Ambulatory Visit: Payer: Self-pay | Admitting: Nurse Practitioner

## 2021-06-20 ENCOUNTER — Encounter: Payer: Self-pay | Admitting: Nurse Practitioner

## 2021-06-20 ENCOUNTER — Ambulatory Visit: Payer: 59 | Admitting: Nurse Practitioner

## 2021-06-20 ENCOUNTER — Ambulatory Visit: Payer: 59 | Attending: Nurse Practitioner | Admitting: Nurse Practitioner

## 2021-06-20 VITALS — BP 151/87 | HR 69 | Ht 62.0 in | Wt 123.5 lb

## 2021-06-20 DIAGNOSIS — E1165 Type 2 diabetes mellitus with hyperglycemia: Secondary | ICD-10-CM | POA: Diagnosis not present

## 2021-06-20 DIAGNOSIS — F5101 Primary insomnia: Secondary | ICD-10-CM

## 2021-06-20 DIAGNOSIS — Z1231 Encounter for screening mammogram for malignant neoplasm of breast: Secondary | ICD-10-CM

## 2021-06-20 DIAGNOSIS — Z23 Encounter for immunization: Secondary | ICD-10-CM | POA: Diagnosis not present

## 2021-06-20 DIAGNOSIS — Z794 Long term (current) use of insulin: Secondary | ICD-10-CM

## 2021-06-20 DIAGNOSIS — H6192 Disorder of left external ear, unspecified: Secondary | ICD-10-CM | POA: Diagnosis not present

## 2021-06-20 DIAGNOSIS — E782 Mixed hyperlipidemia: Secondary | ICD-10-CM

## 2021-06-20 DIAGNOSIS — I10 Essential (primary) hypertension: Secondary | ICD-10-CM

## 2021-06-20 DIAGNOSIS — Z1211 Encounter for screening for malignant neoplasm of colon: Secondary | ICD-10-CM

## 2021-06-20 MED ORDER — BD PEN NEEDLE SHORT U/F 31G X 8 MM MISC
1.0000 "application " | Freq: Every day | 3 refills | Status: AC
  Filled 2021-06-20: qty 100, 25d supply, fill #0

## 2021-06-20 MED ORDER — ROSUVASTATIN CALCIUM 20 MG PO TABS
20.0000 mg | ORAL_TABLET | Freq: Every day | ORAL | 3 refills | Status: DC
  Filled 2021-06-20 – 2021-06-27 (×2): qty 90, 90d supply, fill #0
  Filled 2021-09-14: qty 90, 90d supply, fill #1

## 2021-06-20 MED ORDER — INSULIN GLARGINE (1 UNIT DIAL) 300 UNIT/ML ~~LOC~~ SOPN
PEN_INJECTOR | SUBCUTANEOUS | 5 refills | Status: DC
  Filled 2021-06-20 – 2021-06-27 (×2): qty 1.5, 30d supply, fill #0
  Filled 2021-07-25: qty 1.5, 30d supply, fill #1
  Filled 2021-08-18: qty 1.5, 30d supply, fill #2
  Filled 2021-09-14: qty 1.5, 30d supply, fill #3
  Filled 2021-10-09: qty 1.5, 30d supply, fill #4
  Filled 2021-10-10: qty 1.5, 30d supply, fill #0
  Filled 2021-11-08: qty 1.5, 30d supply, fill #1

## 2021-06-20 MED ORDER — HYDROCHLOROTHIAZIDE 25 MG PO TABS
ORAL_TABLET | ORAL | 1 refills | Status: DC
  Filled 2021-06-20 – 2021-06-25 (×2): qty 90, 90d supply, fill #0
  Filled 2021-09-15: qty 90, 90d supply, fill #1

## 2021-06-20 MED ORDER — LISINOPRIL 40 MG PO TABS
ORAL_TABLET | ORAL | 1 refills | Status: DC
  Filled 2021-06-20 – 2021-06-25 (×2): qty 90, 90d supply, fill #0
  Filled 2021-09-14: qty 90, 90d supply, fill #1

## 2021-06-20 MED ORDER — CARVEDILOL 6.25 MG PO TABS
6.2500 mg | ORAL_TABLET | Freq: Two times a day (BID) | ORAL | 1 refills | Status: DC
  Filled 2021-06-20 – 2021-06-27 (×2): qty 180, 90d supply, fill #0

## 2021-06-20 MED ORDER — TRAZODONE HCL 50 MG PO TABS
ORAL_TABLET | ORAL | 3 refills | Status: DC
  Filled 2021-06-20 – 2021-06-25 (×2): qty 60, 30d supply, fill #0
  Filled 2021-07-25: qty 60, 30d supply, fill #1
  Filled 2021-08-18: qty 60, 30d supply, fill #2
  Filled 2021-09-14: qty 60, 30d supply, fill #3

## 2021-06-20 MED ORDER — AMLODIPINE BESYLATE 10 MG PO TABS
10.0000 mg | ORAL_TABLET | Freq: Every day | ORAL | 1 refills | Status: DC
  Filled 2021-06-20 – 2021-09-14 (×2): qty 90, 90d supply, fill #0

## 2021-06-20 MED ORDER — CARVEDILOL 3.125 MG PO TABS
ORAL_TABLET | ORAL | 1 refills | Status: DC
  Filled 2021-06-20: qty 180, 90d supply, fill #0

## 2021-06-20 NOTE — Progress Notes (Signed)
Assessment & Plan:  Britzy was seen today for establish care.  Diagnoses and all orders for this visit:  Primary hypertension -     amLODipine (NORVASC) 10 MG tablet; Take 1 tablet (10 mg total) by mouth at bedtime. To lower blood pressure -     carvedilol (COREG) 3.125 MG tablet; TAKE 1 TABLET (3.125 MG TOTAL) BY MOUTH 2 (TWO) TIMES DAILY WITH A MEAL. TO LOWER BLOOD PRESSURE -     hydrochlorothiazide (HYDRODIURIL) 25 MG tablet; TAKE 1 TABLET (25 MG TOTAL) BY MOUTH DAILY. TO LOWER BLOOD PRESSURE -     lisinopril (ZESTRIL) 40 MG tablet; TAKE 1 TABLET (40 MG TOTAL) BY MOUTH DAILY. TO LOWER BLOOD PRESSURE -     CMP14+EGFR Continue all antihypertensives as prescribed.  Remember to bring in your blood pressure log with you for your follow up appointment.  DASH/Mediterranean Diets are healthier choices for HTN.    Controlled type 2 diabetes mellitus without complication, with long-term current use of insulin ([facility]) -     insulin glargine, 1 Unit Dial, (TOUJEO) 300 UNIT/ML Solostar Pen; INJECT 15 UNITS INTO THE SKIN DAILY. -     Insulin Pen Needle (B-D ULTRAFINE III SHORT PEN) 31G X 8 MM MISC; use as directed daily -     Hemoglobin A1c Continue blood sugar control as discussed in office today, low carbohydrate diet, and regular physical exercise as tolerated, 150 minutes per week (30 min each day, 5 days per week, or 50 min 3 days per week). Keep blood sugar logs with fasting goal of 90-130 mg/dl, post prandial (after you eat) less than 180.  For Hypoglycemia: BS <60 and Hyperglycemia BS >400; contact the clinic ASAP. Annual eye exams and foot exams are recommended.   Skin lesion of left ear -     Ambulatory referral to Dermatology  Mixed hyperlipidemia -     rosuvastatin (CRESTOR) 20 MG tablet; Take 1 tablet (20 mg total) by mouth daily. -     Lipid panel INSTRUCTIONS: Work on a low fat, heart healthy diet and participate in regular aerobic exercise program by working out at least 150  minutes per week; 5 days a week-30 minutes per day. Avoid red meat/beef/steak,  fried foods. junk foods, sodas, sugary drinks, unhealthy snacking, alcohol and smoking.  Drink at least 80 oz of water per day and monitor your carbohydrate intake daily.    Primary insomnia -     traZODone (DESYREL) 50 MG tablet; TAKE 1 TO 2 TABLETS BY MOUTH AT BEDTIME AS NEEDED FOR INSOMNIA  Breast cancer screening by mammogram -     MM 3D SCREEN BREAST BILATERAL; Future  Colon cancer screening -     Ambulatory referral to Gastroenterology  Need for shingles vaccine -     Varicella-zoster vaccine IM   Patient has been counseled on age-appropriate routine health concerns for screening and prevention. These are reviewed and up-to-date. Referrals have been placed accordingly. Immunizations are up-to-date or declined.    Subjective:   Chief Complaint  Patient presents with   Establish Care   HPI Eleen Litz 55 y.o. female presents to office today to establish care.  She has a past medical history of Anxiety, Gestational diabetes (1993), and Hypertension (1999).   Patient has been counseled on age-appropriate routine health concerns for screening and prevention. These are reviewed and up-to-date. Referrals have been placed accordingly. Immunizations are up-to-date or declined.     PAP SMEAR: hysterectomy at 20 years ago due  to menorrhagia MAMMOGRAM: referred to Breast center COLONOSCOPY: referred to GI  DM Well controlled with Toujeo 15 units daily. States home reading this morning was 140. Cholesterol levels not at goal with rosuvastatin 20 mg daily. She denies any history of hypo or hyperglycemia.  Lab Results  Component Value Date   HGBA1C 6.1 (H) 01/04/2021   Lab Results  Component Value Date   LDLCALC 162 (H) 01/04/2021    HTN Blood pressure not well controlled. She endorses adherence taking amlodipine 10 mg daily, carvedilol 3.125 mg BID, HCTZ 25 mg daily, lisinopril 40 mg. Will increase  carvedilol to 6.25 mg BID BP Readings from Last 3 Encounters:  06/20/21 (!) 151/87  07/19/20 (!) 164/84  06/02/20 (!) 166/82    Skin Problem 6 months ago she noticed a small tan macular lesion on the inner left auricle. The lesion is scaly and itches at times. She does state that the lesion has increased in size.   Insomnia Well controlled with trazodone 50 mg daily .  Review of Systems  Constitutional:  Negative for fever, malaise/fatigue and weight loss.  HENT: Negative.  Negative for nosebleeds.   Eyes: Negative.  Negative for blurred vision, double vision and photophobia.  Respiratory: Negative.  Negative for cough and shortness of breath.   Cardiovascular: Negative.  Negative for chest pain, palpitations and leg swelling.  Gastrointestinal: Negative.  Negative for heartburn, nausea and vomiting.  Musculoskeletal: Negative.  Negative for myalgias.  Skin:        SEE HPI  Neurological: Negative.  Negative for dizziness, focal weakness, seizures and headaches.  Psychiatric/Behavioral: Negative.  Negative for suicidal ideas.    Past Medical History:  Diagnosis Date   Anxiety    At age of 67   Gestational diabetes 86   when pregnant with daughter    Hypertension 89   At age of 33    Past Surgical History:  Procedure Laterality Date   ABDOMINAL HYSTERECTOMY      Family History  Problem Relation Age of Onset   Hypertension Mother    Diabetes Father    Hypertension Sister    Hypertension Brother     Social History Reviewed with no changes to be made today.   Outpatient Medications Prior to Visit  Medication Sig Dispense Refill   Blood Glucose Monitoring Suppl (TRUE METRIX METER) w/Device KIT 1 each by Does not apply route as needed. 1 kit 0   glucose blood (TRUE METRIX BLOOD GLUCOSE TEST) test strip 1 each by Other route 3 (three) times daily. 100 each 11   MELATONIN PO Take 1 tablet by mouth at bedtime as needed (sleep).     TRUEplus Lancets 28G MISC 1 each by  Does not apply route 3 (three) times daily. 100 each 11   amLODipine (NORVASC) 10 MG tablet Take 1 tablet (10 mg total) by mouth at bedtime. To lower blood pressure 90 tablet 1   carvedilol (COREG) 3.125 MG tablet TAKE 1 TABLET (3.125 MG TOTAL) BY MOUTH 2 (TWO) TIMES DAILY WITH A MEAL. TO LOWER BLOOD PRESSURE 180 tablet 1   hydrochlorothiazide (HYDRODIURIL) 25 MG tablet TAKE 1 TABLET (25 MG TOTAL) BY MOUTH DAILY. TO LOWER BLOOD PRESSURE 90 tablet 1   insulin glargine, 1 Unit Dial, (TOUJEO) 300 UNIT/ML Solostar Pen INJECT 15 UNITS INTO THE SKIN DAILY. 1.5 mL 5   Insulin Pen Needle (B-D ULTRAFINE III SHORT PEN) 31G X 8 MM MISC use as directed daily 90 each 3   lisinopril (  ZESTRIL) 40 MG tablet TAKE 1 TABLET (40 MG TOTAL) BY MOUTH DAILY. TO LOWER BLOOD PRESSURE 90 tablet 1   rosuvastatin (CRESTOR) 20 MG tablet Take 1 tablet (20 mg total) by mouth daily. 90 tablet 3   traZODone (DESYREL) 50 MG tablet TAKE 1 TO 2 TABLETS BY MOUTH AT BEDTIME AS NEEDED FOR INSOMNIA 60 tablet 3   HYDROcodone-acetaminophen (NORCO/VICODIN) 5-325 MG tablet Take 1-2 tablets by mouth every 4 (four) hours as needed. (Patient not taking: Reported on 06/20/2021) 12 tablet 0   ibuprofen (ADVIL) 600 MG tablet Take 1 tablet (600 mg total) by mouth every 6 (six) hours as needed. (Patient not taking: Reported on 06/20/2021) 30 tablet 0   loperamide (IMODIUM) 2 MG capsule Take 1 capsule (2 mg total) by mouth as needed for diarrhea or loose stools (available over the counter). (Patient not taking: Reported on 06/20/2021) 20 capsule 0   methocarbamol (ROBAXIN) 500 MG tablet Take 1 tablet (500 mg total) by mouth 2 (two) times daily. (Patient not taking: Reported on 06/20/2021) 20 tablet 0   ondansetron (ZOFRAN ODT) 4 MG disintegrating tablet Take 1 tablet (4 mg total) by mouth every 8 (eight) hours as needed for nausea or vomiting. (Patient not taking: No sig reported) 20 tablet 0   No facility-administered medications prior to visit.    No  Known Allergies     Objective:    BP (!) 151/87   Pulse 69   Ht _0  (1.575 m)   Wt 123 lb 8 oz (56 kg)   SpO2 100%   BMI 22.59 kg/m  Wt Readings from Last 3 Encounters:  06/20/21 123 lb 8 oz (56 kg)  07/19/20 133 lb (60.3 kg)  06/02/20 130 lb 3.2 oz (59.1 kg)    Physical Exam Vitals and nursing note reviewed.  Constitutional:      Appearance: She is well-developed.  HENT:     Head: Normocephalic and atraumatic.     Ears:   Cardiovascular:     Rate and Rhythm: Normal rate and regular rhythm.     Heart sounds: Normal heart sounds. No murmur heard.   No friction rub. No gallop.  Pulmonary:     Effort: Pulmonary effort is normal. No tachypnea or respiratory distress.     Breath sounds: Normal breath sounds. No decreased breath sounds, wheezing, rhonchi or rales.  Chest:     Chest wall: No tenderness.  Abdominal:     General: Bowel sounds are normal.     Palpations: Abdomen is soft.  Musculoskeletal:        General: Normal range of motion.     Cervical back: Normal range of motion.  Skin:    General: Skin is warm and dry.  Neurological:     Mental Status: She is alert and oriented to person, place, and time.     Coordination: Coordination normal.  Psychiatric:        Behavior: Behavior normal. Behavior is cooperative.        Thought Content: Thought content normal.        Judgment: Judgment normal.         Patient has been counseled extensively about nutrition and exercise as well as the importance of adherence with medications and regular follow-up. The patient was given clear instructions to go to ER or return to medical center if symptoms don't improve, worsen or new problems develop. The patient verbalized understanding.   Follow-up: Return for physical .   Gildardo Pounds,  FNP-BC [facility] and [place], [place]   06/20/2021, 2:09 PM

## 2021-06-21 LAB — CMP14+EGFR
ALT: 23 IU/L (ref 0–32)
AST: 27 IU/L (ref 0–40)
Albumin/Globulin Ratio: 2.3 — ABNORMAL HIGH (ref 1.2–2.2)
Albumin: 4.9 g/dL (ref 3.8–4.9)
Alkaline Phosphatase: 56 IU/L (ref 44–121)
BUN/Creatinine Ratio: 36 — ABNORMAL HIGH (ref 9–23)
BUN: 22 mg/dL (ref 6–24)
Bilirubin Total: 0.4 mg/dL (ref 0.0–1.2)
CO2: 21 mmol/L (ref 20–29)
Calcium: 9.6 mg/dL (ref 8.7–10.2)
Chloride: 94 mmol/L — ABNORMAL LOW (ref 96–106)
Creatinine, Ser: 0.61 mg/dL (ref 0.57–1.00)
Globulin, Total: 2.1 g/dL (ref 1.5–4.5)
Glucose: 83 mg/dL (ref 65–99)
Potassium: 4.6 mmol/L (ref 3.5–5.2)
Sodium: 131 mmol/L — ABNORMAL LOW (ref 134–144)
Total Protein: 7 g/dL (ref 6.0–8.5)
eGFR: 106 mL/min/{1.73_m2} (ref >59)

## 2021-06-21 LAB — LIPID PANEL
Chol/HDL Ratio: 3.7 ratio (ref 0.0–4.4)
Cholesterol, Total: 146 mg/dL (ref 100–199)
HDL: 40 mg/dL (ref >39)
LDL Chol Calc (NIH): 89 mg/dL (ref 0–99)
Triglycerides: 92 mg/dL (ref 0–149)
VLDL Cholesterol Cal: 17 mg/dL (ref 5–40)

## 2021-06-21 LAB — HEMOGLOBIN A1C
Est. average glucose Bld gHb Est-mCnc: 120 mg/dL
Hgb A1c MFr Bld: 5.8 % — ABNORMAL HIGH (ref 4.8–5.6)

## 2021-06-26 ENCOUNTER — Encounter: Payer: Self-pay | Admitting: Gastroenterology

## 2021-06-26 ENCOUNTER — Other Ambulatory Visit: Payer: Self-pay

## 2021-06-27 ENCOUNTER — Other Ambulatory Visit: Payer: Self-pay

## 2021-07-25 ENCOUNTER — Other Ambulatory Visit: Payer: Self-pay

## 2021-07-26 ENCOUNTER — Other Ambulatory Visit: Payer: Self-pay

## 2021-07-27 ENCOUNTER — Ambulatory Visit (AMBULATORY_SURGERY_CENTER): Payer: Self-pay

## 2021-07-27 ENCOUNTER — Other Ambulatory Visit: Payer: Self-pay

## 2021-07-27 VITALS — Ht 62.0 in | Wt 125.0 lb

## 2021-07-27 DIAGNOSIS — Z1211 Encounter for screening for malignant neoplasm of colon: Secondary | ICD-10-CM

## 2021-07-27 DIAGNOSIS — T7840XA Allergy, unspecified, initial encounter: Secondary | ICD-10-CM

## 2021-07-27 HISTORY — DX: Allergy, unspecified, initial encounter: T78.40XA

## 2021-07-27 MED ORDER — PEG 3350-KCL-NA BICARB-NACL 420 G PO SOLR: 4000.0000 mL | Freq: Once | ORAL | 0 refills | Status: AC

## 2021-07-27 NOTE — Progress Notes (Signed)
No egg or soy allergy known to patient  No issues known to pt with past sedation with any surgeries or procedures Patient denies ever being told they had issues or difficulty with intubation  No FH of Malignant Hyperthermia Pt is not on diet pills Pt is not on  home 02  Pt is not on blood thinners  Pt denies issues with constipation  No A fib or A flutter  Pt is fully vaccinated  for Covid    NO PA's for preps discussed with pt In PV today  Discussed with pt there will be an out-of-pocket cost for prep and that varies from $0 to 70 +  dollars - pt verbalized understanding   Due to the COVID-19 pandemic we are asking patients to follow certain guidelines in PV and the [place]   Pt aware of COVID protocols and [facility] guidelines

## 2021-08-04 ENCOUNTER — Ambulatory Visit: Payer: 59 | Attending: Nurse Practitioner | Admitting: Nurse Practitioner

## 2021-08-04 ENCOUNTER — Other Ambulatory Visit: Payer: Self-pay

## 2021-08-04 ENCOUNTER — Encounter: Payer: Self-pay | Admitting: Nurse Practitioner

## 2021-08-04 VITALS — BP 148/83 | HR 70 | Ht 62.0 in | Wt 124.4 lb

## 2021-08-04 DIAGNOSIS — E871 Hypo-osmolality and hyponatremia: Secondary | ICD-10-CM | POA: Diagnosis not present

## 2021-08-04 DIAGNOSIS — Z0000 Encounter for general adult medical examination without abnormal findings: Secondary | ICD-10-CM | POA: Diagnosis not present

## 2021-08-04 DIAGNOSIS — I10 Essential (primary) hypertension: Secondary | ICD-10-CM

## 2021-08-04 NOTE — Progress Notes (Signed)
Assessment & Plan:  [name] was seen today for annual exam.  Diagnoses and all orders for this visit:  Encounter for annual physical exam  Hyponatremia -     Basic metabolic panel  Primary hypertension Continue all antihypertensives as prescribed.  Remember to bring in your blood pressure log with you for your follow up appointment.  DASH/Mediterranean Diets are healthier choices for HTN.    Patient has been counseled on age-appropriate routine health concerns for screening and prevention. These are reviewed and up-to-date. Referrals have been placed accordingly. Immunizations are up-to-date or declined.    Subjective:   Chief Complaint  Patient presents with   Annual Exam   HPI [name] 55 y.o. female presents to office today for annual physical exam.  Patient has been counseled on age-appropriate routine health concerns for screening and prevention. These are reviewed and up-to-date. Referrals have been placed accordingly. Immunizations are up-to-date or declined.     Mammogram: Referral has been placed.  She is aware that she is behind with scheduling. Colonoscopy: Scheduled for August 11, 2021   Blood pressure is elevated today.  She endorses medication adherence taking amlodipine 10 mg daily, carvedilol 6.25 mg twice daily, hydrochlorothiazide 25 mg daily and lisinopril 40 mg daily.  I have requested that she monitor her blood pressure at home over the next few weeks.  If continues elevated will need to increase carvedilol. BP Readings from Last 3 Encounters:  08/04/21 (!) 148/83  06/20/21 (!) 151/87  07/19/20 (!) 164/84     Review of Systems  Constitutional:  Negative for fever, malaise/fatigue and weight loss.  HENT: Negative.  Negative for nosebleeds.   Eyes: Negative.  Negative for blurred vision, double vision and photophobia.  Respiratory: Negative.  Negative for cough and shortness of breath.   Cardiovascular: Negative.  Negative for chest pain,  palpitations and leg swelling.  Gastrointestinal: Negative.  Negative for heartburn, nausea and vomiting.  Genitourinary: Negative.   Musculoskeletal: Negative.  Negative for myalgias.  Skin: Negative.   Neurological: Negative.  Negative for dizziness, focal weakness, seizures and headaches.  Endo/Heme/Allergies: Negative.   Psychiatric/Behavioral: Negative.  Negative for suicidal ideas.    Past Medical History:  Diagnosis Date   Allergy 07/27/2021   seasonal, environmental   Anxiety    At age of 62   GERD (gastroesophageal reflux disease)    Gestational diabetes 10/02/1991   when pregnant with daughter    Hyperlipidemia    Hypertension 10/01/1997   At age of 66    Past Surgical History:  Procedure Laterality Date   ABDOMINAL HYSTERECTOMY  1999    Family History  Problem Relation Age of Onset   Hypertension Mother    Diabetes Father    Colon polyps Sister    Hypertension Sister    Hypertension Brother    Throat cancer Maternal Uncle    Colon cancer Neg Hx    Esophageal cancer Neg Hx    Rectal cancer Neg Hx    Stomach cancer Neg Hx     Social History Reviewed with no changes to be made today.   Outpatient Medications Prior to Visit  Medication Sig Dispense Refill   amLODipine (NORVASC) 10 MG tablet Take 1 tablet (10 mg total) by mouth at bedtime. To lower blood pressure 90 tablet 1   aspirin EC 81 MG tablet Take 81 mg by mouth daily. Swallow whole.     Blood Glucose Monitoring Suppl (TRUE METRIX METER) w/Device KIT 1 each by Does  not apply route as needed. 1 kit 0   carvedilol (COREG) 6.25 MG tablet Take 1 tablet (6.25 mg total) by mouth 2 (two) times daily with a meal. 180 tablet 1   diphenhydrAMINE (BENADRYL) 25 MG tablet Take 25 mg by mouth every 6 (six) hours as needed.     glucose blood (TRUE METRIX BLOOD GLUCOSE TEST) test strip 1 each by Other route 3 (three) times daily. 100 each 11   hydrochlorothiazide (HYDRODIURIL) 25 MG tablet TAKE 1 TABLET (25 MG TOTAL)  BY MOUTH DAILY. TO LOWER BLOOD PRESSURE 90 tablet 1   ibuprofen (ADVIL) 400 MG tablet Take 400 mg by mouth every 6 (six) hours as needed.     insulin glargine, 1 Unit Dial, (TOUJEO) 300 UNIT/ML Solostar Pen INJECT 15 UNITS INTO THE SKIN DAILY. 1.5 mL 5   Insulin Pen Needle (B-D ULTRAFINE III SHORT PEN) 31G X 8 MM MISC use as directed daily 100 each 3   lisinopril (ZESTRIL) 40 MG tablet TAKE 1 TABLET (40 MG TOTAL) BY MOUTH DAILY. TO LOWER BLOOD PRESSURE 90 tablet 1   omeprazole (PRILOSEC) 10 MG capsule Take 10 mg by mouth daily.     phenylephrine (SUDAFED PE) 10 MG TABS tablet Take 10 mg by mouth every 4 (four) hours as needed.     rosuvastatin (CRESTOR) 20 MG tablet Take 1 tablet (20 mg total) by mouth daily. 90 tablet 3   traZODone (DESYREL) 50 MG tablet TAKE 1 TO 2 TABLETS BY MOUTH AT BEDTIME AS NEEDED FOR INSOMNIA 60 tablet 3   TRUEplus Lancets 28G MISC 1 each by Does not apply route 3 (three) times daily. 100 each 11   MELATONIN PO Take 1 tablet by mouth at bedtime as needed (sleep). (Patient not taking: Reported on 08/04/2021)     No facility-administered medications prior to visit.    No Known Allergies     Objective:    BP (!) 148/83   Pulse 70   Ht '5\' 2"'  (1.575 m)   Wt 124 lb 6 oz (56.4 kg)   SpO2 100%   BMI 22.75 kg/m  Wt Readings from Last 3 Encounters:  08/04/21 124 lb 6 oz (56.4 kg)  07/27/21 125 lb (56.7 kg)  06/20/21 123 lb 8 oz (56 kg)    Physical Exam Constitutional:      Appearance: She is well-developed.  HENT:     Head: Normocephalic and atraumatic.     Right Ear: Hearing, tympanic membrane, ear canal and external ear normal.     Left Ear: Hearing, tympanic membrane, ear canal and external ear normal.     Nose: Nose normal.     Mouth/Throat:     Lips: Pink.     Mouth: Mucous membranes are moist.     Pharynx: No oropharyngeal exudate.  Eyes:     General: No scleral icterus.       Right eye: No discharge.     Extraocular Movements: Extraocular movements  intact.     Conjunctiva/sclera: Conjunctivae normal.     Pupils: Pupils are equal, round, and reactive to light.  Neck:     Thyroid: No thyromegaly.     Trachea: No tracheal deviation.  Cardiovascular:     Rate and Rhythm: Normal rate and regular rhythm.     Heart sounds: Normal heart sounds. No murmur heard.   No friction rub.  Pulmonary:     Effort: Pulmonary effort is normal. No accessory muscle usage or respiratory distress.  Breath sounds: Normal breath sounds. No decreased breath sounds, wheezing, rhonchi or rales.  Chest:     Chest wall: No tenderness.  Breasts:    Breasts are symmetrical.     Right: No inverted nipple, mass, nipple discharge, skin change or tenderness.     Left: No inverted nipple, mass, nipple discharge, skin change or tenderness.  Abdominal:     General: Bowel sounds are normal. There is no distension.     Palpations: Abdomen is soft. There is no mass.     Tenderness: There is no abdominal tenderness. There is no guarding or rebound.  Musculoskeletal:        General: No tenderness or deformity. Normal range of motion.     Cervical back: Normal range of motion and neck supple.  Lymphadenopathy:     Cervical: No cervical adenopathy.  Skin:    General: Skin is warm and dry.     Findings: No erythema.  Neurological:     Mental Status: She is alert and oriented to person, place, and time.     Cranial Nerves: No cranial nerve deficit.     Sensory: Sensation is intact.     Motor: Motor function is intact.     Coordination: Coordination is intact. Coordination normal.     Gait: Gait is intact.     Deep Tendon Reflexes:     Reflex Scores:      Patellar reflexes are 1+ on the right side and 1+ on the left side. Psychiatric:        Speech: Speech normal.        Behavior: Behavior normal.        Thought Content: Thought content normal.        Judgment: Judgment normal.         Patient has been counseled extensively about nutrition and exercise as  well as the importance of adherence with medications and regular follow-up. The patient was given clear instructions to go to ER or return to medical center if symptoms don't improve, worsen or new problems develop. The patient verbalized understanding.   Follow-up: Return for tele on tuesday in 2 weeks BP check.  see me in office in 3 months.   Gildardo Pounds, FNP-BC [facility] [place], [place]   08/04/2021, 1:05 PM

## 2021-08-05 LAB — BASIC METABOLIC PANEL
BUN/Creatinine Ratio: 30 — ABNORMAL HIGH (ref 9–23)
BUN: 23 mg/dL (ref 6–24)
CO2: 22 mmol/L (ref 20–29)
Calcium: 9.7 mg/dL (ref 8.7–10.2)
Chloride: 91 mmol/L — ABNORMAL LOW (ref 96–106)
Creatinine, Ser: 0.76 mg/dL (ref 0.57–1.00)
Glucose: 86 mg/dL (ref 70–99)
Potassium: 4.8 mmol/L (ref 3.5–5.2)
Sodium: 130 mmol/L — ABNORMAL LOW (ref 134–144)
eGFR: 92 mL/min/{1.73_m2} (ref >59)

## 2021-08-08 ENCOUNTER — Telehealth: Payer: Self-pay

## 2021-08-08 NOTE — Telephone Encounter (Signed)
Left vm to return call to the office.

## 2021-08-08 NOTE — Telephone Encounter (Signed)
-----   Message from Gildardo Pounds, NP sent at 08/07/2021  1:44 PM EST ----- Sodium is low. If you drink or consume alcohol please refrain as this could also cause low sodium and affect your heart. BUN shows dehydration. Try to drink more water at least 50 oz daily. If you do not drink or consume alcohol we need to stop your HCTZ. Please let us know if you are a consumer of alcohol a few times per week or more

## 2021-08-11 ENCOUNTER — Ambulatory Visit (AMBULATORY_SURGERY_CENTER): Payer: 59 | Admitting: Gastroenterology

## 2021-08-11 ENCOUNTER — Encounter: Payer: Self-pay | Admitting: Gastroenterology

## 2021-08-11 ENCOUNTER — Other Ambulatory Visit: Payer: Self-pay

## 2021-08-11 VITALS — BP 113/59 | HR 68 | Temp 98.6°F | Resp 19 | Ht 62.0 in | Wt 125.0 lb

## 2021-08-11 DIAGNOSIS — D122 Benign neoplasm of ascending colon: Secondary | ICD-10-CM

## 2021-08-11 DIAGNOSIS — Z1211 Encounter for screening for malignant neoplasm of colon: Secondary | ICD-10-CM | POA: Diagnosis present

## 2021-08-11 DIAGNOSIS — Z8371 Family history of colonic polyps: Secondary | ICD-10-CM | POA: Diagnosis not present

## 2021-08-11 DIAGNOSIS — D125 Benign neoplasm of sigmoid colon: Secondary | ICD-10-CM

## 2021-08-11 MED ORDER — SODIUM CHLORIDE 0.9 % IV SOLN: 500.0000 mL | Freq: Once | INTRAVENOUS | Status: DC

## 2021-08-11 NOTE — Progress Notes (Signed)
Called to room to assist during endoscopic procedure.  Patient ID and intended procedure confirmed with present staff. Received instructions for my participation in the procedure from the performing physician.  

## 2021-08-11 NOTE — Progress Notes (Signed)
VS completed by DT.  Pt's states no medical or surgical changes since previsit or office visit.  

## 2021-08-11 NOTE — Progress Notes (Signed)
 Referring Provider: Fleming, Zelda W, NP Primary Care Physician:  Fleming, Zelda W, NP  Reason for Procedure:  Colon cancer screening   IMPRESSION:  Need for colon cancer screening Family history of colon polyps (sister) Appropriate candidate for monitored anesthesia care  PLAN: Colonoscopy in the [facility] today   HPI: [name] is a 55 y.o. female presents for screening colonoscopy.  No prior colonoscopy or colon cancer screening.  No baseline GI symptoms.   Sister with colon polyps. No other known family history of colon cancer or polyps. No family history of uterine/endometrial cancer, pancreatic cancer or gastric/stomach cancer.   Past Medical History:  Diagnosis Date   Allergy 07/27/2021   seasonal, environmental   Anxiety    At age of 32   GERD (gastroesophageal reflux disease)    Gestational diabetes 10/02/1991   when pregnant with daughter    Hyperlipidemia    Hypertension 10/01/1997   At age of 32    Past Surgical History:  Procedure Laterality Date   ABDOMINAL HYSTERECTOMY  1999    Current Outpatient Medications  Medication Sig Dispense Refill   amLODipine (NORVASC) 10 MG tablet Take 1 tablet (10 mg total) by mouth at bedtime. To lower blood pressure 90 tablet 1   aspirin EC 81 MG tablet Take 81 mg by mouth daily. Swallow whole.     Blood Glucose Monitoring Suppl (TRUE METRIX METER) w/Device KIT 1 each by Does not apply route as needed. 1 kit 0   carvedilol (COREG) 6.25 MG tablet Take 1 tablet (6.25 mg total) by mouth 2 (two) times daily with a meal. 180 tablet 1   diphenhydrAMINE (BENADRYL) 25 MG tablet Take 25 mg by mouth every 6 (six) hours as needed.     glucose blood (TRUE METRIX BLOOD GLUCOSE TEST) test strip 1 each by Other route 3 (three) times daily. 100 each 11   hydrochlorothiazide (HYDRODIURIL) 25 MG tablet TAKE 1 TABLET (25 MG TOTAL) BY MOUTH DAILY. TO LOWER BLOOD PRESSURE 90 tablet 1   ibuprofen (ADVIL) 400 MG tablet Take 400 mg by mouth  every 6 (six) hours as needed.     insulin glargine, 1 Unit Dial, (TOUJEO) 300 UNIT/ML Solostar Pen INJECT 15 UNITS INTO THE SKIN DAILY. 1.5 mL 5   Insulin Pen Needle (B-D ULTRAFINE III SHORT PEN) 31G X 8 MM MISC use as directed daily 100 each 3   lisinopril (ZESTRIL) 40 MG tablet TAKE 1 TABLET (40 MG TOTAL) BY MOUTH DAILY. TO LOWER BLOOD PRESSURE 90 tablet 1   omeprazole (PRILOSEC) 10 MG capsule Take 10 mg by mouth daily.     phenylephrine (SUDAFED PE) 10 MG TABS tablet Take 10 mg by mouth every 4 (four) hours as needed.     rosuvastatin (CRESTOR) 20 MG tablet Take 1 tablet (20 mg total) by mouth daily. 90 tablet 3   traZODone (DESYREL) 50 MG tablet TAKE 1 TO 2 TABLETS BY MOUTH AT BEDTIME AS NEEDED FOR INSOMNIA 60 tablet 3   TRUEplus Lancets 28G MISC 1 each by Does not apply route 3 (three) times daily. 100 each 11   MELATONIN PO Take 1 tablet by mouth at bedtime as needed (sleep). (Patient not taking: No sig reported)     Current Facility-Administered Medications  Medication Dose Route Frequency Provider Last Rate Last Admin   0.9 %  sodium chloride infusion  500 mL Intravenous Once Beavers, Kimberly, MD        Allergies as of 08/11/2021   (No   Known Allergies)    Family History  Problem Relation Age of Onset   Hypertension Mother    Diabetes Father    Colon polyps Sister    Hypertension Sister    Hypertension Brother    Throat cancer Maternal Uncle    Colon cancer Neg Hx    Esophageal cancer Neg Hx    Rectal cancer Neg Hx    Stomach cancer Neg Hx      Physical Exam: General:   Alert,  well-nourished, pleasant and cooperative in NAD Head:  Normocephalic and atraumatic. Eyes:  Sclera clear, no icterus.   Conjunctiva pink. Mouth:  No deformity or lesions.   Neck:  Supple; no masses or thyromegaly. Lungs:  Clear throughout to auscultation.   No wheezes. Heart:  Regular rate and rhythm; no murmurs. Abdomen:  Soft, non-tender, nondistended, normal bowel sounds, no rebound or  guarding.  Msk:  Symmetrical. No boney deformities LAD: No inguinal or umbilical LAD Extremities:  No clubbing or edema. Neurologic:  Alert and  oriented x4;  grossly nonfocal Skin:  No obvious rash or bruise. Psych:  Alert and cooperative. Normal mood and affect.      Kimberly L. Beavers, MD, MPH 08/11/2021, 8:33 AM      

## 2021-08-11 NOTE — Progress Notes (Signed)
Vss nad pt transferred to pacu

## 2021-08-11 NOTE — Patient Instructions (Signed)
Handouts provided on polyps, diverticulosis and high-fiber diet.   YOU HAD AN ENDOSCOPIC PROCEDURE TODAY AT [facility]:   Refer to the procedure report that was given to you for any specific questions about what was found during the examination.  If the procedure report does not answer your questions, please call your gastroenterologist to clarify.  If you requested that your care partner not be given the details of your procedure findings, then the procedure report has been included in a sealed envelope for you to review at your convenience later.  YOU SHOULD EXPECT: Some feelings of bloating in the abdomen. Passage of more gas than usual.  Walking can help get rid of the air that was put into your GI tract during the procedure and reduce the bloating. If you had a lower endoscopy (such as a colonoscopy or flexible sigmoidoscopy) you may notice spotting of blood in your stool or on the toilet paper. If you underwent a bowel prep for your procedure, you may not have a normal bowel movement for a few days.  Please Note:  You might notice some irritation and congestion in your nose or some drainage.  This is from the oxygen used during your procedure.  There is no need for concern and it should clear up in a day or so.  SYMPTOMS TO REPORT IMMEDIATELY:  Following lower endoscopy (colonoscopy or flexible sigmoidoscopy):  Excessive amounts of blood in the stool  Significant tenderness or worsening of abdominal pains  Swelling of the abdomen that is new, acute  Fever of 100F or higher  For urgent or emergent issues, a gastroenterologist can be reached at any hour by calling [phone]. Do not use MyChart messaging for urgent concerns.    DIET:  We do recommend a small meal at first, but then you may proceed to your regular diet.  Drink plenty of fluids but you should avoid alcoholic beverages for 24 hours.  ACTIVITY:  You should plan to take it easy for the rest of today and  you should NOT DRIVE or use heavy machinery until tomorrow (because of the sedation medicines used during the test).    FOLLOW UP: Our staff will call the number listed on your records 48-72 hours following your procedure to check on you and address any questions or concerns that you may have regarding the information given to you following your procedure. If we do not reach you, we will leave a message.  We will attempt to reach you two times.  During this call, we will ask if you have developed any symptoms of COVID 19. If you develop any symptoms (ie: fever, flu-like symptoms, shortness of breath, cough etc.) before then, please call [phone].  If you test positive for Covid 19 in the 2 weeks post procedure, please call and report this information to [place].    If any biopsies were taken you will be contacted by phone or by letter within the next 1-3 weeks.  Please call us at [phone] if you have not heard about the biopsies in 3 weeks.    SIGNATURES/CONFIDENTIALITY: You and/or your care partner have signed paperwork which will be entered into your electronic medical record.  These signatures attest to the fact that that the information above on your After Visit Summary has been reviewed and is understood.  Full responsibility of the confidentiality of this discharge information lies with you and/or your care-partner.

## 2021-08-11 NOTE — Op Note (Addendum)
[place] Patient Name: [name] Procedure Date: 08/11/2021 8:34 AM MRN: [id number] Endoscopist: Thornton Park MD, MD Age: 55 Referring MD:  Date of Birth: 1965-11-14 Gender: Female Account #: [id number] Procedure:                Colonoscopy Indications:              Screening for colorectal malignant neoplasm, This                            is the patient's first colonoscopy                           Sister with colon polyps                           No known family history of colon cancer Medicines:                Monitored Anesthesia Care Procedure:                Pre-Anesthesia Assessment:                           - Prior to the procedure, a History and Physical                            was performed, and patient medications and                            allergies were reviewed. The patient's tolerance of                            previous anesthesia was also reviewed. The risks                            and benefits of the procedure and the sedation                            options and risks were discussed with the patient.                            All questions were answered, and informed consent                            was obtained. Prior Anticoagulants: The patient has                            taken no previous anticoagulant or antiplatelet                            agents. ASA Grade Assessment: II - A patient with                            mild systemic disease. After reviewing the risks  and benefits, the patient was deemed in                            satisfactory condition to undergo the procedure.                           After obtaining informed consent, the colonoscope                            was passed under direct vision. Throughout the                            procedure, the patient's blood pressure, pulse, and                            oxygen saturations were monitored continuously. The                             PCF-HQ190L Colonoscope was introduced through the                            anus and advanced to the 2 cm into the ileum. A                            second forward view of the right colon was                            performed. The colonoscopy was performed without                            difficulty. The patient tolerated the procedure                            well. The quality of the bowel preparation was good                            however almost 1029mL of green, gritty stool was                            removed during the procedure. The terminal ileum,                            ileocecal valve, appendiceal orifice, and rectum                            were photographed. Scope In: 8:46:29 AM Scope Out: 9:06:29 AM Scope Withdrawal Time: 0 hours 14 minutes 13 seconds  Total Procedure Duration: 0 hours 20 minutes 0 seconds  Findings:                 The perianal and digital rectal examinations were                            normal.  Multiple small and large-mouthed diverticula were                            found in the sigmoid colon and descending colon.                           A 5 mm polyp was found in the sigmoid colon. The                            polyp was sessile. The polyp was removed with a                            cold snare. Resection and retrieval were complete.                            Estimated blood loss was minimal.                           There was a medium-sized lipoma, in the proximal                            ascending colon. The overlying mucosa had a                            slightly adenomatous appearance. However, there was                            a positive pillow sign. Tunneled biopsies were                            taken with a cold forceps for histology. Estimated                            blood loss was minimal.                           The exam was otherwise without abnormality  on                            direct and retroflexion views. Complications:            No immediate complications. Estimated blood loss:                            Minimal. Estimated Blood Loss:     Estimated blood loss was minimal. Impression:               - Diverticulosis in the sigmoid colon and in the                            descending colon.                           - One 5 mm polyp in the sigmoid colon, removed with  a cold snare. Resected and retrieved.                           - Medium-sized lipoma in the proximal ascending                            colon. Biopsied.                           - The examination was otherwise normal on direct                            and retroflexion views. Recommendation:           - Patient has a contact number available for                            emergencies. The signs and symptoms of potential                            delayed complications were discussed with the                            patient. Return to normal activities tomorrow.                            Written discharge instructions were provided to the                            patient.                           - High fiber diet.                           - Continue present medications.                           - Await pathology results.                           - Repeat colonoscopy date to be determined after                            pending pathology results are reviewed for                            surveillance. Interval should not exceed 5 years                            given the family history. Recommend 2 day bowel                            prep at that time.                           - Emerging evidence supports  eating a diet of                            fruits, vegetables, grains, calcium, and yogurt                            while reducing red meat and alcohol may reduce the                            risk of colon  cancer.                           - Thank you for allowing me to be involved in your                            colon cancer prevention. Thornton Park MD, MD 08/11/2021 9:11:07 AM This report has been signed electronically.

## 2021-08-15 ENCOUNTER — Telehealth: Payer: Self-pay | Admitting: *Deleted

## 2021-08-15 ENCOUNTER — Telehealth: Payer: Self-pay

## 2021-08-15 NOTE — Telephone Encounter (Signed)
  Follow up Call-  Call back number 08/11/2021  Post procedure Call Back phone  # [phone]  Permission to leave phone message Yes  Some recent data might be hidden     Patient questions:  Do you have a fever, pain , or abdominal swelling? No. Pain Score  0 *  Have you tolerated food without any problems? Yes.    Have you been able to return to your normal activities? Yes.    Do you have any questions about your discharge instructions: Diet   No. Medications  No. Follow up visit  No.  Do you have questions or concerns about your Care? No.  Pt. Went back to work the following day.  Has resumed her diet and activities.  Noted some blood the day after the procedure, "about a tablespoon".  Pt. Was quite sore until today.   Generalized abdominal soreness has diminished today.  Told pt. To call if [place] she has any further questions or concerns regarding her recovery.  Actions: * If pain score is 4 or above: No action needed, pain <4.

## 2021-08-15 NOTE — Telephone Encounter (Signed)
No answer left VM

## 2021-08-18 ENCOUNTER — Other Ambulatory Visit: Payer: Self-pay

## 2021-08-18 ENCOUNTER — Other Ambulatory Visit: Payer: Self-pay | Admitting: Nurse Practitioner

## 2021-08-18 ENCOUNTER — Telehealth: Payer: Self-pay | Admitting: Nurse Practitioner

## 2021-08-18 MED ORDER — OMEPRAZOLE 10 MG PO CPDR
10.0000 mg | DELAYED_RELEASE_CAPSULE | Freq: Every day | ORAL | 0 refills | Status: DC
  Filled 2021-08-18: qty 90, 90d supply, fill #0

## 2021-08-18 NOTE — Telephone Encounter (Signed)
Pt is calling to confirm that the ov scheduled with Lurena Joiner on Monday will be a telephone visit. Pt states that she was advised to write her BP done. Please advise CB- [phone]

## 2021-08-18 NOTE — Telephone Encounter (Signed)
Called patient. We rescheduled to 09/05/2021 at 0830 per pt request.

## 2021-08-21 ENCOUNTER — Other Ambulatory Visit: Payer: Self-pay

## 2021-08-21 ENCOUNTER — Ambulatory Visit: Payer: 59 | Admitting: Pharmacist

## 2021-08-24 ENCOUNTER — Encounter: Payer: Self-pay | Admitting: Gastroenterology

## 2021-09-05 ENCOUNTER — Other Ambulatory Visit: Payer: Self-pay

## 2021-09-05 ENCOUNTER — Ambulatory Visit: Payer: 59 | Attending: Nurse Practitioner | Admitting: Pharmacist

## 2021-09-05 ENCOUNTER — Encounter: Payer: Self-pay | Admitting: Pharmacist

## 2021-09-05 VITALS — BP 146/78 | HR 72

## 2021-09-05 DIAGNOSIS — I10 Essential (primary) hypertension: Secondary | ICD-10-CM

## 2021-09-05 MED ORDER — CARVEDILOL 12.5 MG PO TABS
12.5000 mg | ORAL_TABLET | Freq: Two times a day (BID) | ORAL | 3 refills | Status: DC
  Filled 2021-09-05: qty 60, 30d supply, fill #0
  Filled 2021-09-14 (×2): qty 60, 30d supply, fill #1

## 2021-09-05 MED ORDER — OMEPRAZOLE 20 MG PO CPDR
20.0000 mg | DELAYED_RELEASE_CAPSULE | Freq: Every day | ORAL | 3 refills | Status: DC
  Filled 2021-09-05: qty 30, 30d supply, fill #0
  Filled 2021-09-14 (×2): qty 30, 30d supply, fill #1
  Filled 2021-11-08: qty 30, 30d supply, fill #0

## 2021-09-05 NOTE — Progress Notes (Signed)
S:    Patient arrives in good spirits. Presents to the clinic for hypertension evaluation, counseling, and management. Patient was referred and last seen by Primary Care Provider on 08/04/2021. BP at that visit was 148/83. She endorsed compliance with her antihypertensives so she was instructed to check at home and bring readings in today.   Medication adherence reported. She has taken her medications this morning. Took around ~7:00 AM.   Current BP Medications include:  carvedilol 6.25 mg BID, amlodipine 10 mg daily, HCTZ 25 mg daily, lisinopril 40 mg daily  Dietary habits include: admits to eating out more lately. Does not add salt to food when she cooks. Drinks three 18oz coffees in the morning.  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 Very active at work but none outside of work.  Family / Social history:  - Fhx: HTN, DM  - Tobacco: former smoker (quit in 2020) - Alcohol: none reported. 5 standard drinks/wk listed on her social history in her profile    O:  Vitals:   09/05/21 0858  BP: (!) 146/78  Pulse: 72   Home BP readings:  - Wide range at home but majority of readings are close if not at goal  - 11/7: 124/77 11/8: 133/72 11/9: 159/89 11/14: 165/9. Recheck was 139/84 11/15: 129/78 11/16: 129/77 11/17: 155/91 11/26: 138/83 11/27: 126/73 11/28:131/71 12/1: 154/82 12/5: 158/84 12/6: 113/76, 137/66   HR: 70 - 91  Most of the time she does not have a chance to sit and rest for 5 minutes before taking her BP. She does sit with her back supported and both feet flat on the ground.   Last 3 Office BP readings: BP Readings from Last 3 Encounters:  09/05/21 (!) 146/78  08/11/21 (!) 113/59  08/04/21 (!) 148/83    BMET    Component Value Date/Time   NA 130 (L) 08/04/2021 1103   K 4.8 08/04/2021 1103   CL 91 (L) 08/04/2021 1103   CO2 22 08/04/2021 1103   GLUCOSE 86 08/04/2021 1103   GLUCOSE 125 (H) 09/17/2019 0544   BUN 23 08/04/2021 1103    CREATININE 0.76 08/04/2021 1103   CREATININE 0.61 02/21/2015 1032   CALCIUM 9.7 08/04/2021 1103   GFRNONAA 105 06/02/2020 0911   GFRNONAA >89 02/21/2015 1032   GFRAA 122 06/02/2020 0911   GFRAA >89 02/21/2015 1032    Renal function: CrCl cannot be calculated (Patient's most recent lab result is older than the maximum 21 days allowed.).  Clinical ASCVD: No  The 10-year ASCVD risk score (Arnett DK, et al., 2019) is: 6.4%   Values used to calculate the score:     Age: 55 years     Sex: Female     Is Non-Hispanic African American: No     Diabetic: Yes     Tobacco smoker: No     Systolic Blood Pressure: 027 mmHg     Is BP treated: Yes     HDL Cholesterol: 40 mg/dL     Total Cholesterol: 146 mg/dL   A/P: Hypertension longstanding currently uncontrolled on current medications. BP Goal = < 130/80 mmHg. Medication adherence reported. Her BP at home is closer to goal. Will have her increase carvedilol dose today.   -Increased dose of carvedilol to 12.5 mg BID. -Continue amlodipine 10 mg daily, lisinopril 40 mg daily, and HCTZ 25 mg daily -Counseled on lifestyle modifications for blood pressure control including reduced dietary sodium, increased exercise, adequate sleep.  Results reviewed and  written information provided.   Total time in face-to-face counseling 30 minutes.   F/U Clinic Visit in 1 month.  Benard Halsted, PharmD, [place], [place] [phone]

## 2021-09-06 ENCOUNTER — Other Ambulatory Visit: Payer: Self-pay | Admitting: Nurse Practitioner

## 2021-09-06 ENCOUNTER — Telehealth: Payer: Self-pay | Admitting: Pharmacist

## 2021-09-06 MED ORDER — HYDROXYZINE PAMOATE 25 MG PO CAPS: 25.0000 mg | ORAL_CAPSULE | Freq: Three times a day (TID) | ORAL | 1 refills | Status: DC | PRN

## 2021-09-06 NOTE — Telephone Encounter (Signed)
Patient was seen yesterday for BP check. She is endorsing increased stress and anxiety and tells me that she used to take hydroxyzine with good control. Will route to PCP to see if we can send in a rx for hydroxyzine to [facility] pharmacy.

## 2021-09-06 NOTE — Telephone Encounter (Signed)
Medication has been sent.  

## 2021-09-07 ENCOUNTER — Other Ambulatory Visit: Payer: Self-pay

## 2021-09-07 NOTE — Telephone Encounter (Signed)
Let pt know that Rx has been sent to the pharmacy.

## 2021-09-14 ENCOUNTER — Other Ambulatory Visit: Payer: Self-pay

## 2021-09-15 ENCOUNTER — Other Ambulatory Visit: Payer: Self-pay

## 2021-09-19 ENCOUNTER — Other Ambulatory Visit: Payer: Self-pay

## 2021-10-09 ENCOUNTER — Other Ambulatory Visit: Payer: Self-pay | Admitting: Nurse Practitioner

## 2021-10-09 DIAGNOSIS — F5101 Primary insomnia: Secondary | ICD-10-CM

## 2021-10-10 ENCOUNTER — Ambulatory Visit: Payer: 59 | Admitting: Pharmacist

## 2021-10-10 ENCOUNTER — Other Ambulatory Visit: Payer: Self-pay

## 2021-10-10 MED ORDER — TRAZODONE HCL 50 MG PO TABS
ORAL_TABLET | ORAL | 0 refills | Status: DC
  Filled 2021-10-10: qty 60, fill #0
  Filled 2021-10-10: qty 60, 30d supply, fill #0

## 2021-10-10 NOTE — Telephone Encounter (Signed)
Requested Prescriptions  Pending Prescriptions Disp Refills   traZODone (DESYREL) 50 MG tablet 60 tablet 0    Sig: TAKE 1 TO 2 TABLETS BY MOUTH AT BEDTIME AS NEEDED FOR INSOMNIA     Psychiatry: Antidepressants - Serotonin Modulator Passed - 10/09/2021  7:18 PM      Passed - Valid encounter within last 6 months    Recent Outpatient Visits          1 month ago Primary hypertension   [place], RPH-CPP   2 months ago Encounter for annual physical exam   [place], Vernia Buff, NP   3 months ago Primary hypertension   [place], Vernia Buff, NP   9 months ago Essential hypertension   [place], [place], [place]   1 year ago Controlled type 2 diabetes mellitus without complication, with long-term current use of insulin [facility])   [place], Dionne Bucy, [place]      Future Appointments            In 2 weeks [place], Jarome Matin, [place]   In 1 month Gildardo Pounds, NP [place]

## 2021-10-12 ENCOUNTER — Other Ambulatory Visit: Payer: Self-pay

## 2021-10-24 ENCOUNTER — Telehealth: Payer: Self-pay | Admitting: Nurse Practitioner

## 2021-10-24 ENCOUNTER — Ambulatory Visit: Payer: 59 | Admitting: Pharmacist

## 2021-10-24 ENCOUNTER — Other Ambulatory Visit: Payer: Self-pay

## 2021-10-24 ENCOUNTER — Telehealth: Payer: Self-pay

## 2021-10-24 NOTE — Telephone Encounter (Signed)
Called to inform pt they have 1 refill on the medications requested. Pt need to call pharmacy to get refilled.

## 2021-10-24 NOTE — Telephone Encounter (Signed)
Pt is requesting a refill on her hydroxyzine 25mg   Next visit: 11/10/21  Pharm: [facility]

## 2021-10-31 ENCOUNTER — Other Ambulatory Visit: Payer: Self-pay | Admitting: Nurse Practitioner

## 2021-10-31 NOTE — Telephone Encounter (Signed)
Requested Prescriptions  Pending Prescriptions Disp Refills   hydrOXYzine (VISTARIL) 25 MG capsule [Pharmacy Med Name: HYDROXYZINE PAM 25 MG CAP] 90 capsule 1    Sig: TAKE 1 CAPSULE BY MOUTH EVERY 8 HOURS AS NEEDED     Ear, Nose, and Throat:  Antihistamines Passed - 10/31/2021  1:53 AM      Passed - Valid encounter within last 12 months    Recent Outpatient Visits          1 month ago Primary hypertension   [place], Jarome Matin, RPH-CPP   2 months ago Encounter for annual physical exam   [place], Vernia Buff, NP   4 months ago Primary hypertension   [place], Vernia Buff, NP   10 months ago Essential hypertension   [place], [place], [place]   1 year ago Controlled type 2 diabetes mellitus without complication, with long-term current use of insulin [facility])   [place], Dionne Bucy, [place]      Future Appointments            In 1 week Gildardo Pounds, NP [place]

## 2021-11-08 ENCOUNTER — Other Ambulatory Visit: Payer: Self-pay | Admitting: Nurse Practitioner

## 2021-11-08 ENCOUNTER — Other Ambulatory Visit: Payer: Self-pay

## 2021-11-08 DIAGNOSIS — F5101 Primary insomnia: Secondary | ICD-10-CM

## 2021-11-08 MED ORDER — TRAZODONE HCL 50 MG PO TABS
ORAL_TABLET | ORAL | 0 refills | Status: DC
  Filled 2021-11-08: qty 60, 30d supply, fill #0

## 2021-11-08 NOTE — Telephone Encounter (Signed)
Requested Prescriptions  Pending Prescriptions Disp Refills   traZODone (DESYREL) 50 MG tablet 60 tablet 0    Sig: TAKE 1 TO 2 TABLETS BY MOUTH AT BEDTIME AS NEEDED FOR INSOMNIA     Psychiatry: Antidepressants - Serotonin Modulator Passed - 11/08/2021  9:53 AM      Passed - Valid encounter within last 6 months    Recent Outpatient Visits          2 months ago Primary hypertension   [place], Jarome Matin, RPH-CPP   3 months ago Encounter for annual physical exam   [place], Vernia Buff, NP   4 months ago Primary hypertension   [place], Vernia Buff, NP   10 months ago Essential hypertension   [place], [place], [place]   1 year ago Controlled type 2 diabetes mellitus without complication, with long-term current use of insulin [facility])   [place], Dionne Bucy, [place]      Future Appointments            In 2 days Gildardo Pounds, NP [place]

## 2021-11-10 ENCOUNTER — Ambulatory Visit: Payer: 59 | Attending: Nurse Practitioner | Admitting: Nurse Practitioner

## 2021-11-10 ENCOUNTER — Other Ambulatory Visit: Payer: Self-pay

## 2021-11-10 ENCOUNTER — Encounter: Payer: Self-pay | Admitting: Nurse Practitioner

## 2021-11-10 VITALS — BP 128/72 | HR 68 | Ht 62.0 in | Wt 129.2 lb

## 2021-11-10 DIAGNOSIS — Z794 Long term (current) use of insulin: Secondary | ICD-10-CM

## 2021-11-10 DIAGNOSIS — Z23 Encounter for immunization: Secondary | ICD-10-CM

## 2021-11-10 DIAGNOSIS — E1169 Type 2 diabetes mellitus with other specified complication: Secondary | ICD-10-CM

## 2021-11-10 DIAGNOSIS — K219 Gastro-esophageal reflux disease without esophagitis: Secondary | ICD-10-CM

## 2021-11-10 DIAGNOSIS — I10 Essential (primary) hypertension: Secondary | ICD-10-CM

## 2021-11-10 DIAGNOSIS — E785 Hyperlipidemia, unspecified: Secondary | ICD-10-CM

## 2021-11-10 DIAGNOSIS — F5101 Primary insomnia: Secondary | ICD-10-CM

## 2021-11-10 DIAGNOSIS — E1165 Type 2 diabetes mellitus with hyperglycemia: Secondary | ICD-10-CM

## 2021-11-10 DIAGNOSIS — F419 Anxiety disorder, unspecified: Secondary | ICD-10-CM

## 2021-11-10 DIAGNOSIS — H6192 Disorder of left external ear, unspecified: Secondary | ICD-10-CM

## 2021-11-10 DIAGNOSIS — E782 Mixed hyperlipidemia: Secondary | ICD-10-CM

## 2021-11-10 MED ORDER — ROSUVASTATIN CALCIUM 20 MG PO TABS
20.0000 mg | ORAL_TABLET | Freq: Every day | ORAL | 3 refills | Status: DC
  Filled 2021-11-10 – 2022-02-15 (×4): qty 90, 90d supply, fill #0
  Filled 2022-05-23: qty 90, 90d supply, fill #1

## 2021-11-10 MED ORDER — TRAZODONE HCL 50 MG PO TABS: 50.0000 mg | ORAL_TABLET | Freq: Every day | ORAL | 1 refills | Status: DC

## 2021-11-10 MED ORDER — OMEPRAZOLE 20 MG PO CPDR
20.0000 mg | DELAYED_RELEASE_CAPSULE | Freq: Every day | ORAL | 1 refills | Status: DC
  Filled 2021-11-10 – 2021-12-05 (×2): qty 90, 90d supply, fill #0
  Filled 2022-02-14 – 2022-02-15 (×2): qty 90, 90d supply, fill #1

## 2021-11-10 MED ORDER — AMLODIPINE BESYLATE 10 MG PO TABS
10.0000 mg | ORAL_TABLET | Freq: Every day | ORAL | 1 refills | Status: AC
  Filled 2021-11-10 – 2022-02-15 (×3): qty 90, 90d supply, fill #0
  Filled 2022-05-23: qty 90, 90d supply, fill #1

## 2021-11-10 MED ORDER — HYDROCHLOROTHIAZIDE 25 MG PO TABS
25.0000 mg | ORAL_TABLET | Freq: Every day | ORAL | 1 refills | Status: DC
  Filled 2021-11-10 – 2022-02-19 (×2): qty 90, 90d supply, fill #0
  Filled 2022-05-23: qty 90, 90d supply, fill #1

## 2021-11-10 MED ORDER — HYDROCHLOROTHIAZIDE 25 MG PO TABS: 25.0000 mg | ORAL_TABLET | Freq: Every day | ORAL | 1 refills | Status: DC

## 2021-11-10 MED ORDER — INSULIN GLARGINE (1 UNIT DIAL) 300 UNIT/ML ~~LOC~~ SOPN
15.0000 [IU] | PEN_INJECTOR | Freq: Every day | SUBCUTANEOUS | 5 refills | Status: DC
  Filled 2021-11-10: qty 4.5, fill #0
  Filled 2021-12-05: qty 4.5, 90d supply, fill #0
  Filled 2022-02-19: qty 4.5, 90d supply, fill #1
  Filled 2022-05-23: qty 4.5, 90d supply, fill #2

## 2021-11-10 MED ORDER — CARVEDILOL 12.5 MG PO TABS
12.5000 mg | ORAL_TABLET | Freq: Two times a day (BID) | ORAL | 3 refills | Status: DC
  Filled 2021-11-10 – 2021-12-05 (×2): qty 60, 30d supply, fill #0
  Filled 2021-12-26: qty 60, 30d supply, fill #1
  Filled 2022-02-07: qty 60, 30d supply, fill #2
  Filled 2022-02-19 – 2022-02-28 (×2): qty 60, 30d supply, fill #3

## 2021-11-10 MED ORDER — HYDROXYZINE PAMOATE 25 MG PO CAPS: 25.0000 mg | ORAL_CAPSULE | Freq: Three times a day (TID) | ORAL | 6 refills | Status: DC | PRN

## 2021-11-10 MED ORDER — INSULIN GLARGINE (1 UNIT DIAL) 300 UNIT/ML ~~LOC~~ SOPN: 15.0000 [IU] | PEN_INJECTOR | Freq: Every day | SUBCUTANEOUS | 5 refills | Status: DC

## 2021-11-10 MED ORDER — OMEPRAZOLE 20 MG PO CPDR: 20.0000 mg | DELAYED_RELEASE_CAPSULE | Freq: Every day | ORAL | 1 refills | Status: DC

## 2021-11-10 MED ORDER — LISINOPRIL 40 MG PO TABS: ORAL_TABLET | ORAL | 1 refills | Status: DC

## 2021-11-10 MED ORDER — TRAZODONE HCL 100 MG PO TABS
100.0000 mg | ORAL_TABLET | Freq: Every day | ORAL | 3 refills | Status: DC
  Filled 2021-11-10 – 2021-12-05 (×2): qty 90, 90d supply, fill #0
  Filled 2022-02-14: qty 90, 90d supply, fill #1
  Filled 2022-04-11 – 2022-04-23 (×2): qty 90, 90d supply, fill #2
  Filled 2022-05-23: qty 90, 90d supply, fill #3

## 2021-11-10 MED ORDER — LISINOPRIL 40 MG PO TABS
ORAL_TABLET | ORAL | 1 refills | Status: DC
  Filled 2021-11-10: qty 90, fill #0
  Filled 2021-12-26: qty 90, 90d supply, fill #0
  Filled 2022-04-11: qty 90, 90d supply, fill #1

## 2021-11-10 NOTE — Progress Notes (Signed)
Assessment & Plan:  [name] was seen today for hypertension.  Diagnoses and all orders for this visit:  Primary hypertension -     CMP14+EGFR -     Discontinue: hydrochlorothiazide (HYDRODIURIL) 25 MG tablet; Take 1 tablet (25 mg total) by mouth daily. -     Discontinue: lisinopril (ZESTRIL) 40 MG tablet; TAKE 1 TABLET (40 MG TOTAL) BY MOUTH DAILY. TO LOWER BLOOD PRESSURE -     lisinopril (ZESTRIL) 40 MG tablet; TAKE 1 TABLET (40 MG TOTAL) BY MOUTH DAILY. TO LOWER BLOOD PRESSURE -     hydrochlorothiazide (HYDRODIURIL) 25 MG tablet; Take 1 tablet (25 mg total) by mouth daily. -     carvedilol (COREG) 12.5 MG tablet; Take 1 tablet (12.5 mg total) by mouth 2 (two) times daily with a meal. -     amLODipine (NORVASC) 10 MG tablet; Take 1 tablet (10 mg total) by mouth at bedtime. To lower blood pressure Continue all antihypertensives as prescribed.  Remember to bring in your blood pressure log with you for your follow up appointment.  DASH/Mediterranean Diets are healthier choices for HTN.    Controlled type 2 diabetes mellitus with hyperglycemia, with long-term current use of insulin (HCC) -     Hemoglobin A1c -     Cancel: Ambulatory referral to Ophthalmology -     Discontinue: insulin glargine, 1 Unit Dial, (TOUJEO) 300 UNIT/ML Solostar Pen; Inject 15 Units into the skin daily. -     insulin glargine, 1 Unit Dial, (TOUJEO) 300 UNIT/ML Solostar Pen; Inject 15 Units into the skin daily.  Type 2 diabetes mellitus with hyperlipidemia (HCC) -     Lipid panel  Primary insomnia -     Discontinue: traZODone (DESYREL) 50 MG tablet; Take 1-2 tablets (50-100 mg total) by mouth at bedtime. -     traZODone (DESYREL) 100 MG tablet; Take 1 tablet (100 mg total) by mouth at bedtime.  Skin lesion of left ear -     Ambulatory referral to Dermatology SEE NOTES FROM 06-20-2021  Mixed hyperlipidemia -     rosuvastatin (CRESTOR) 20 MG tablet; Take 1 tablet (20 mg total) by mouth daily. TEACHING  INSTRUCTIONS: Work on a low fat, heart healthy diet and participate in regular aerobic exercise program by working out at least 150 minutes per week; 5 days a week-30 minutes per day. Avoid red meat/beef/steak,  fried foods. junk foods, sodas, sugary drinks, unhealthy snacking, alcohol and smoking.  Drink at least 80 oz of water per day and monitor your carbohydrate intake daily.    Need for shingles vaccine -     Varicella-zoster vaccine IM (Shingrix)  Anxiety -     hydrOXYzine (VISTARIL) 25 MG capsule; Take 1 capsule (25 mg total) by mouth every 8 (eight) hours as needed.  GERD without esophagitis -     omeprazole (PRILOSEC) 20 MG capsule; Take 1 capsule (20 mg total) by mouth daily. INSTRUCTIONS: Avoid GERD Triggers: acidic, spicy or fried foods, caffeine, coffee, sodas,  alcohol and chocolate.     Patient has been counseled on age-appropriate routine health concerns for screening and prevention. These are reviewed and up-to-date. Referrals have been placed accordingly. Immunizations are up-to-date or declined.    Subjective:   Chief Complaint  Patient presents with   Hypertension    [name] 56 y.o. female presents to office today for follow up to HTN  Past medical history significant for type 2 diabetes, dyslipidemia, alcoholic hepatitis without ascites, primary hypertension, left carotid  stenosis, tobacco dependence   HTN Currently taking amlodipine 10 mg daily, carvedilol 12.5 mg twice daily, hydrochlorothiazide 25 mg daily and lisinopril 40 daily.  Blood pressure is well controlled.  BP Readings from Last 3 Encounters:  11/10/21 128/72  09/05/21 (!) 146/78  08/11/21 (!) 113/59    GERD Symptoms well controlled with PPI   Skin Problem 6 months ago she noticed a small tan macular lesion on the inner left auricle. The lesion is scaly and itches at times. She does state that the lesion has increased in size.  Review of Systems  Constitutional:  Negative for fever,  malaise/fatigue and weight loss.  HENT: Negative.  Negative for nosebleeds.   Eyes: Negative.  Negative for blurred vision, double vision and photophobia.  Respiratory: Negative.  Negative for cough and shortness of breath.   Cardiovascular: Negative.  Negative for chest pain, palpitations and leg swelling.  Gastrointestinal:  Positive for heartburn. Negative for nausea and vomiting.  Musculoskeletal: Negative.  Negative for myalgias.  Skin:        SEE HPI  Neurological: Negative.  Negative for dizziness, focal weakness, seizures and headaches.  Psychiatric/Behavioral:  Negative for suicidal ideas. The patient is nervous/anxious and has insomnia.    Past Medical History:  Diagnosis Date   Allergy 07/27/2021   seasonal, environmental   Anxiety    At age of 55   GERD (gastroesophageal reflux disease)    Gestational diabetes 10/02/1991   when pregnant with daughter    Hyperlipidemia    Hypertension 10/01/1997   At age of 2    Past Surgical History:  Procedure Laterality Date   ABDOMINAL HYSTERECTOMY  1999    Family History  Problem Relation Age of Onset   Hypertension Mother    Diabetes Father    Colon polyps Sister    Hypertension Sister    Hypertension Brother    Throat cancer Maternal Uncle    Colon cancer Neg Hx    Esophageal cancer Neg Hx    Rectal cancer Neg Hx    Stomach cancer Neg Hx     Social History Reviewed with no changes to be made today.   Outpatient Medications Prior to Visit  Medication Sig Dispense Refill   aspirin EC 81 MG tablet Take 81 mg by mouth daily. Swallow whole.     Blood Glucose Monitoring Suppl (TRUE METRIX METER) w/Device KIT 1 each by Does not apply route as needed. 1 kit 0   glucose blood (TRUE METRIX BLOOD GLUCOSE TEST) test strip 1 each by Other route 3 (three) times daily. 100 each 11   insulin glargine, 1 Unit Dial, (TOUJEO) 300 UNIT/ML Solostar Pen INJECT 15 UNITS INTO THE SKIN DAILY. 1.5 mL 5   omeprazole (PRILOSEC) 20 MG  capsule Take 1 capsule (20 mg total) by mouth daily. 30 capsule 3   traZODone (DESYREL) 50 MG tablet TAKE 1 TO 2 TABLETS BY MOUTH AT BEDTIME AS NEEDED FOR INSOMNIA 60 tablet 0   amLODipine (NORVASC) 10 MG tablet Take 1 tablet (10 mg total) by mouth at bedtime. To lower blood pressure 90 tablet 1   carvedilol (COREG) 12.5 MG tablet Take 1 tablet (12.5 mg total) by mouth 2 (two) times daily with a meal. 60 tablet 3   hydrochlorothiazide (HYDRODIURIL) 25 MG tablet TAKE 1 TABLET (25 MG TOTAL) BY MOUTH DAILY. TO LOWER BLOOD PRESSURE 90 tablet 1   hydrOXYzine (VISTARIL) 25 MG capsule TAKE 1 CAPSULE BY MOUTH EVERY 8 HOURS AS NEEDED 90  capsule 1   lisinopril (ZESTRIL) 40 MG tablet TAKE 1 TABLET (40 MG TOTAL) BY MOUTH DAILY. TO LOWER BLOOD PRESSURE 90 tablet 1   rosuvastatin (CRESTOR) 20 MG tablet Take 1 tablet (20 mg total) by mouth daily. 90 tablet 3   ibuprofen (ADVIL) 400 MG tablet Take 400 mg by mouth every 6 (six) hours as needed. (Patient not taking: Reported on 11/10/2021)     Insulin Pen Needle (B-D ULTRAFINE III SHORT PEN) 31G X 8 MM MISC use as directed daily (Patient not taking: Reported on 11/10/2021) 100 each 3   MELATONIN PO Take 1 tablet by mouth at bedtime as needed (sleep). (Patient not taking: Reported on 11/10/2021)     phenylephrine (SUDAFED PE) 10 MG TABS tablet Take 10 mg by mouth every 4 (four) hours as needed. (Patient not taking: Reported on 11/10/2021)     TRUEplus Lancets 28G MISC 1 each by Does not apply route 3 (three) times daily. (Patient not taking: Reported on 11/10/2021) 100 each 11   No facility-administered medications prior to visit.    No Known Allergies     Objective:    BP 128/72    Pulse 68    Ht '5\' 2"'  (1.575 m)    Wt 129 lb 4 oz (58.6 kg)    SpO2 97%    BMI 23.64 kg/m  Wt Readings from Last 3 Encounters:  11/10/21 129 lb 4 oz (58.6 kg)  08/11/21 125 lb (56.7 kg)  08/04/21 124 lb 6 oz (56.4 kg)    Physical Exam Vitals and nursing note reviewed.   Constitutional:      Appearance: She is well-developed.  HENT:     Head: Normocephalic and atraumatic.  Cardiovascular:     Rate and Rhythm: Normal rate and regular rhythm.     Heart sounds: Normal heart sounds. No murmur heard.   No friction rub. No gallop.  Pulmonary:     Effort: Pulmonary effort is normal. No tachypnea or respiratory distress.     Breath sounds: Normal breath sounds. No decreased breath sounds, wheezing, rhonchi or rales.  Chest:     Chest wall: No tenderness.  Abdominal:     General: Bowel sounds are normal.     Palpations: Abdomen is soft.  Musculoskeletal:        General: Normal range of motion.     Cervical back: Normal range of motion.  Skin:    General: Skin is warm and dry.     Findings: No rash.  Neurological:     Mental Status: She is alert and oriented to person, place, and time.     Coordination: Coordination normal.  Psychiatric:        Behavior: Behavior normal. Behavior is cooperative.        Thought Content: Thought content normal.        Judgment: Judgment normal.         Patient has been counseled extensively about nutrition and exercise as well as the importance of adherence with medications and regular follow-up. The patient was given clear instructions to go to ER or return to medical center if symptoms don't improve, worsen or new problems develop. The patient verbalized understanding.   Follow-up: Return in about 3 months (around 02/07/2022).   Gildardo Pounds, FNP-BC [facility] [place], [place]   11/10/2021, 10:00 AM

## 2021-11-10 NOTE — Patient Instructions (Signed)
PLEASE CALL TO SCHEDULE MAMMOGRAM [facility] [address] [place], [place] [address] Phone [phone] Fax [phone]

## 2021-11-10 NOTE — Progress Notes (Deleted)
Established Patient Office Visit  Subjective:  Patient ID: [name], female    DOB: 12-10-1965  Age: 56 y.o. MRN: [id number]  CC:  Chief Complaint  Patient presents with   Hypertension    HPI Adamary Savary presents for FOLLOW UP TO HTN.  Past medical history significant for type 2 diabetes, dyslipidemia, alcoholic hepatitis without ascites, primary hypertension, left carotid stenosis, tobacco dependence    Currently taking amlodipine 10 mg daily, carvedilol 12.5 mg twice daily, hydrochlorothiazide 25 mg daily and lisinopril 40 daily.  We will BP Readings from Last 3 Encounters:  09/05/21 (!) 146/78  08/11/21 (!) 113/59  08/04/21 (!) 148/83     Past Medical History:  Diagnosis Date   Allergy 07/27/2021   seasonal, environmental   Anxiety    At age of 34   GERD (gastroesophageal reflux disease)    Gestational diabetes 10/02/1991   when pregnant with daughter    Hyperlipidemia    Hypertension 10/01/1997   At age of 68    Past Surgical History:  Procedure Laterality Date   ABDOMINAL HYSTERECTOMY  1999    Family History  Problem Relation Age of Onset   Hypertension Mother    Diabetes Father    Colon polyps Sister    Hypertension Sister    Hypertension Brother    Throat cancer Maternal Uncle    Colon cancer Neg Hx    Esophageal cancer Neg Hx    Rectal cancer Neg Hx    Stomach cancer Neg Hx     Social History   Socioeconomic History   Marital status: Married    Spouse name: Not on file   Number of children: 1    Years of education: 12    Highest education level: Not on file  Occupational History   Occupation: Housekeeper   Tobacco Use   Smoking status: Former    Packs/day: 1.00    Years: 38.00    Pack years: 38.00    Types: Cigarettes    Quit date: 05/15/2019    Years since quitting: 2.4    Passive exposure: Past   Smokeless tobacco: Never  Vaping Use   Vaping Use: Never used  Substance and Sexual Activity   Alcohol use: Yes     Alcohol/week: 5.0 standard drinks    Types: 3 Glasses of wine, 2 Cans of beer per week    Comment: 40 oz daily   Drug use: Never   Sexual activity: Yes    Partners: Male    Birth control/protection: Other-see comments  Other Topics Concern   Not on file  Social History Narrative   Lives with boyfriend of 6 years.    Daughter   Dog    Social Determinants of Health   Financial Resource Strain: Not on file  Food Insecurity: Not on file  Transportation Needs: Not on file  Physical Activity: Not on file  Stress: Not on file  Social Connections: Not on file  Intimate Partner Violence: Not on file    Outpatient Medications Prior to Visit  Medication Sig Dispense Refill   amLODipine (NORVASC) 10 MG tablet Take 1 tablet (10 mg total) by mouth at bedtime. To lower blood pressure 90 tablet 1   aspirin EC 81 MG tablet Take 81 mg by mouth daily. Swallow whole.     Blood Glucose Monitoring Suppl (TRUE METRIX METER) w/Device KIT 1 each by Does not apply route as needed. 1 kit 0   carvedilol (COREG) 12.5 MG tablet  Take 1 tablet (12.5 mg total) by mouth 2 (two) times daily with a meal. 60 tablet 3   glucose blood (TRUE METRIX BLOOD GLUCOSE TEST) test strip 1 each by Other route 3 (three) times daily. 100 each 11   hydrochlorothiazide (HYDRODIURIL) 25 MG tablet TAKE 1 TABLET (25 MG TOTAL) BY MOUTH DAILY. TO LOWER BLOOD PRESSURE 90 tablet 1   hydrOXYzine (VISTARIL) 25 MG capsule TAKE 1 CAPSULE BY MOUTH EVERY 8 HOURS AS NEEDED 90 capsule 1   insulin glargine, 1 Unit Dial, (TOUJEO) 300 UNIT/ML Solostar Pen INJECT 15 UNITS INTO THE SKIN DAILY. 1.5 mL 5   lisinopril (ZESTRIL) 40 MG tablet TAKE 1 TABLET (40 MG TOTAL) BY MOUTH DAILY. TO LOWER BLOOD PRESSURE 90 tablet 1   omeprazole (PRILOSEC) 20 MG capsule Take 1 capsule (20 mg total) by mouth daily. 30 capsule 3   rosuvastatin (CRESTOR) 20 MG tablet Take 1 tablet (20 mg total) by mouth daily. 90 tablet 3   traZODone (DESYREL) 50 MG tablet TAKE 1 TO 2  TABLETS BY MOUTH AT BEDTIME AS NEEDED FOR INSOMNIA 60 tablet 0   ibuprofen (ADVIL) 400 MG tablet Take 400 mg by mouth every 6 (six) hours as needed. (Patient not taking: Reported on 11/10/2021)     Insulin Pen Needle (B-D ULTRAFINE III SHORT PEN) 31G X 8 MM MISC use as directed daily (Patient not taking: Reported on 11/10/2021) 100 each 3   MELATONIN PO Take 1 tablet by mouth at bedtime as needed (sleep). (Patient not taking: Reported on 11/10/2021)     phenylephrine (SUDAFED PE) 10 MG TABS tablet Take 10 mg by mouth every 4 (four) hours as needed. (Patient not taking: Reported on 11/10/2021)     TRUEplus Lancets 28G MISC 1 each by Does not apply route 3 (three) times daily. (Patient not taking: Reported on 11/10/2021) 100 each 11   No facility-administered medications prior to visit.    No Known Allergies  ROS Review of Systems    Objective:    Physical Exam  There were no vitals taken for this visit. Wt Readings from Last 3 Encounters:  08/11/21 125 lb (56.7 kg)  08/04/21 124 lb 6 oz (56.4 kg)  07/27/21 125 lb (56.7 kg)     Health Maintenance Due  Topic Date Due   MAMMOGRAM  02/01/2016   FOOT EXAM  09/10/2020   COVID-19 Vaccine (4 - Booster for Pfizer series) 09/30/2020   Zoster Vaccines- Shingrix (2 of 2) 08/15/2021   OPHTHALMOLOGY EXAM  10/14/2021    There are no preventive care reminders to display for this patient.  Lab Results  Component Value Date   TSH 2.050 06/02/2020   Lab Results  Component Value Date   WBC 6.1 01/04/2021   HGB 12.4 01/04/2021   HCT 35.5 01/04/2021   MCV 92 01/04/2021   PLT 225 01/04/2021   Lab Results  Component Value Date   NA 130 (L) 08/04/2021   K 4.8 08/04/2021   CO2 22 08/04/2021   GLUCOSE 86 08/04/2021   BUN 23 08/04/2021   CREATININE 0.76 08/04/2021   BILITOT 0.4 06/20/2021   ALKPHOS 56 06/20/2021   AST 27 06/20/2021   ALT 23 06/20/2021   PROT 7.0 06/20/2021   ALBUMIN 4.9 06/20/2021   CALCIUM 9.7 08/04/2021   ANIONGAP  12 09/17/2019   EGFR 92 08/04/2021   Lab Results  Component Value Date   CHOL 146 06/20/2021   Lab Results  Component Value Date   HDL  40 06/20/2021   Lab Results  Component Value Date   LDLCALC 89 06/20/2021   Lab Results  Component Value Date   TRIG 92 06/20/2021   Lab Results  Component Value Date   CHOLHDL 3.7 06/20/2021   Lab Results  Component Value Date   HGBA1C 5.8 (H) 06/20/2021      Assessment & Plan:   Problem List Items Addressed This Visit       Endocrine   Controlled type 2 diabetes mellitus without complication, with long-term current use of insulin ([place]) - Primary   Relevant Orders   Hemoglobin A1c   Other Visit Diagnoses     Primary hypertension       Relevant Orders   CMP14+EGFR   Type 2 diabetes mellitus with hyperlipidemia ([place])       Relevant Orders   Lipid panel       No orders of the defined types were placed in this encounter.   Follow-up: No follow-ups on file.    Gildardo Pounds, NP

## 2021-11-11 LAB — CMP14+EGFR
ALT: 42 IU/L — ABNORMAL HIGH (ref 0–32)
AST: 39 IU/L (ref 0–40)
Albumin/Globulin Ratio: 2.5 — ABNORMAL HIGH (ref 1.2–2.2)
Albumin: 5.2 g/dL — ABNORMAL HIGH (ref 3.8–4.9)
Alkaline Phosphatase: 58 IU/L (ref 44–121)
BUN/Creatinine Ratio: 33 — ABNORMAL HIGH (ref 9–23)
BUN: 23 mg/dL (ref 6–24)
Bilirubin Total: 0.6 mg/dL (ref 0.0–1.2)
CO2: 21 mmol/L (ref 20–29)
Calcium: 9.6 mg/dL (ref 8.7–10.2)
Chloride: 100 mmol/L (ref 96–106)
Creatinine, Ser: 0.69 mg/dL (ref 0.57–1.00)
Globulin, Total: 2.1 g/dL (ref 1.5–4.5)
Glucose: 114 mg/dL — ABNORMAL HIGH (ref 70–99)
Potassium: 4.5 mmol/L (ref 3.5–5.2)
Sodium: 135 mmol/L (ref 134–144)
Total Protein: 7.3 g/dL (ref 6.0–8.5)
eGFR: 102 mL/min/{1.73_m2} (ref >59)

## 2021-11-11 LAB — LIPID PANEL
Chol/HDL Ratio: 3.8 ratio (ref 0.0–4.4)
Cholesterol, Total: 207 mg/dL — ABNORMAL HIGH (ref 100–199)
HDL: 54 mg/dL (ref >39)
LDL Chol Calc (NIH): 119 mg/dL — ABNORMAL HIGH (ref 0–99)
Triglycerides: 193 mg/dL — ABNORMAL HIGH (ref 0–149)
VLDL Cholesterol Cal: 34 mg/dL (ref 5–40)

## 2021-11-11 LAB — HEMOGLOBIN A1C
Est. average glucose Bld gHb Est-mCnc: 126 mg/dL
Hgb A1c MFr Bld: 6 % — ABNORMAL HIGH (ref 4.8–5.6)

## 2021-12-05 ENCOUNTER — Other Ambulatory Visit: Payer: Self-pay

## 2021-12-06 ENCOUNTER — Other Ambulatory Visit: Payer: Self-pay

## 2021-12-07 ENCOUNTER — Other Ambulatory Visit: Payer: Self-pay

## 2021-12-08 ENCOUNTER — Other Ambulatory Visit: Payer: Self-pay

## 2021-12-20 ENCOUNTER — Telehealth: Payer: Self-pay

## 2021-12-20 ENCOUNTER — Telehealth: Payer: Self-pay | Admitting: *Deleted

## 2021-12-20 NOTE — Telephone Encounter (Signed)
Spoke to pharmacy and they stated the pt picked up medication at 1:16 pm today. ?

## 2021-12-20 NOTE — Telephone Encounter (Signed)
Copied from [place] [id number]. Topic: General - Other ?>> Dec 20, 2021 10:38 AM Holley Dexter N wrote: ?Reason for CRM: Pt called in stating the  medication was sent to the [facility] pharmacy, and she said when she pulls it up it does show her that its there with 6 refills, but they do not refill those.  I do see on pt medication list that it looks like it was sent the correct pharmacy that she wanted, [id number] ?[address] [place] [address] ?Phone:  [phone]?Fax:  [phone] she states it is not there, and needs [facility]. Please advise. ?>> Dec 20, 2021 10:42 AM TValere Drosswrote: ?For medication hydrOXYzine (VISTARIL) 25 MG capsule  ?

## 2021-12-26 ENCOUNTER — Other Ambulatory Visit: Payer: Self-pay

## 2022-01-14 ENCOUNTER — Emergency Department
Admission: EM | Admit: 2022-01-14 | Discharge: 2022-01-14 | Disposition: A | Discharge status: Home / Self Care | Payer: 59 | Source: Home / Self Care | Attending: Family Medicine | Admitting: Family Medicine

## 2022-01-14 ENCOUNTER — Encounter: Payer: Self-pay | Admitting: Emergency Medicine

## 2022-01-14 DIAGNOSIS — R0981 Nasal congestion: Secondary | ICD-10-CM | POA: Diagnosis not present

## 2022-01-14 DIAGNOSIS — J209 Acute bronchitis, unspecified: Secondary | ICD-10-CM

## 2022-01-14 MED ORDER — PREDNISONE 20 MG PO TABS: ORAL_TABLET | ORAL | 0 refills | Status: DC

## 2022-01-14 MED ORDER — AMOXICILLIN-POT CLAVULANATE 875-125 MG PO TABS: 1.0000 | ORAL_TABLET | Freq: Two times a day (BID) | ORAL | 0 refills | Status: DC

## 2022-01-14 NOTE — ED Provider Notes (Signed)
?[place] ? ? ? ?CSN: [id number] ?Arrival date & time: 01/14/22  1456 ? ? ?  ? ?History   ?Chief Complaint ?Chief Complaint  ?Patient presents with  ? Headache  ? Cough  ? Nasal Congestion  ? ? ?HPI ?[name] is a 56 y.o. female.  ? ?HPI ? ?Patient's been sick for a week.  She has cough and cold, runny stuffy nose, postnasal drip, hoarse voice, yellow nasal congestion, for a week.  For the last couple days she has developed significant left ear pain that shoots pain off and on throughout the day.  He is worried about an ear infection.  Also complains of headache.  Is been using DayQuil and allergy pills and Tylenol, he has help briefly ?Previously a smoker ? ?Past Medical History:  ?Diagnosis Date  ? Allergy 07/27/2021  ? seasonal, environmental  ? Anxiety   ? At age of 28  ? GERD (gastroesophageal reflux disease)   ? Gestational diabetes 10/02/1991  ? when pregnant with daughter   ? Hyperlipidemia   ? Hypertension 10/01/1997  ? At age of 22  ? ? ?Patient Active Problem List  ? Diagnosis Date Noted  ? Controlled type 2 diabetes mellitus without complication, with long-term current use of insulin ([place]) 09/19/2019  ? Carotid stenosis, left 09/19/2019  ? Acute colitis 09/15/2019  ? SIRS (systemic inflammatory response syndrome) ([place]) 09/15/2019  ? AKI (acute kidney injury) ([place]) 09/15/2019  ? Mixed hyperlipidemia 06/04/2017  ? Alcoholic hepatitis without ascites 02/23/2015  ? HTN (hypertension), malignant 02/21/2015  ? Current smoker 02/21/2015  ? Diabetes type 2, uncontrolled 02/21/2015  ? Poor dentition 02/21/2015  ? ETOH abuse 02/21/2015  ? ? ?Past Surgical History:  ?Procedure Laterality Date  ? ABDOMINAL HYSTERECTOMY  1999  ? ? ?OB History   ?No obstetric history on file. ?  ? ? ? ?Home Medications   ? ?Prior to Admission medications   ?Medication Sig Start Date End Date Taking? Authorizing Provider  ?amLODipine (NORVASC) 10 MG tablet Take 1 tablet (10 mg total) by mouth at bedtime. To lower  blood pressure 11/10/21  Yes Gildardo Pounds, NP  ?amoxicillin-clavulanate (AUGMENTIN) 875-125 MG tablet Take 1 tablet by mouth every 12 (twelve) hours. 01/14/22  Yes Raylene Everts, MD  ?aspirin EC 81 MG tablet Take 81 mg by mouth daily. Swallow whole.   Yes [provider]  ?Blood Glucose Monitoring Suppl (TRUE METRIX METER) w/Device KIT 1 each by Does not apply route as needed. 01/04/21  Yes Argentina Donovan, PA-C  ?carvedilol (COREG) 12.5 MG tablet Take 1 tablet (12.5 mg total) by mouth 2 (two) times daily with a meal. 11/10/21  Yes Gildardo Pounds, NP  ?glucose blood (TRUE METRIX BLOOD GLUCOSE TEST) test strip 1 each by Other route 3 (three) times daily. 01/05/21  Yes Charlott Rakes, MD  ?hydrochlorothiazide (HYDRODIURIL) 25 MG tablet Take 1 tablet (25 mg total) by mouth daily. 11/10/21 02/08/22 Yes Gildardo Pounds, NP  ?hydrOXYzine (VISTARIL) 25 MG capsule Take 1 capsule (25 mg total) by mouth every 8 (eight) hours as needed. 11/10/21  Yes Gildardo Pounds, NP  ?ibuprofen (ADVIL) 400 MG tablet Take 400 mg by mouth every 6 (six) hours as needed.   Yes [provider]  ?insulin glargine, 1 Unit Dial, (TOUJEO) 300 UNIT/ML Solostar Pen INJECT 15 UNITS INTO THE SKIN DAILY. 06/20/21 06/20/22 Yes Gildardo Pounds, NP  ?insulin glargine, 1 Unit Dial, (TOUJEO) 300 UNIT/ML Solostar Pen Inject 15  Units into the skin daily. 11/10/21 03/07/22 Yes Gildardo Pounds, NP  ?Insulin Pen Needle (B-D ULTRAFINE III SHORT PEN) 31G X 8 MM MISC use as directed daily 06/20/21  Yes Gildardo Pounds, NP  ?lisinopril (ZESTRIL) 40 MG tablet TAKE 1 TABLET (40 MG TOTAL) BY MOUTH DAILY. TO LOWER BLOOD PRESSURE 11/10/21 11/10/22 Yes Gildardo Pounds, NP  ?MELATONIN PO Take 1 tablet by mouth at bedtime as needed (sleep).   Yes [provider]  ?omeprazole (PRILOSEC) 20 MG capsule Take 1 capsule (20 mg total) by mouth daily. 09/05/21  Yes Charlott Rakes, MD  ?omeprazole (PRILOSEC) 20 MG capsule Take 1 capsule (20 mg total) by  mouth daily. 11/10/21 03/07/22 Yes Gildardo Pounds, NP  ?phenylephrine (SUDAFED PE) 10 MG TABS tablet Take 10 mg by mouth every 4 (four) hours as needed.   Yes [provider]  ?predniSONE (DELTASONE) 20 MG tablet Take 2 tablets (40 mg) once a day 01/14/22  Yes Raylene Everts, MD  ?rosuvastatin (CRESTOR) 20 MG tablet Take 1 tablet (20 mg total) by mouth daily. 11/10/21  Yes Gildardo Pounds, NP  ?traZODone (DESYREL) 100 MG tablet Take 1 tablet (100 mg total) by mouth at bedtime. 11/10/21 03/07/22 Yes Gildardo Pounds, NP  ?traZODone (DESYREL) 50 MG tablet TAKE 1 TO 2 TABLETS BY MOUTH AT BEDTIME AS NEEDED FOR INSOMNIA 11/08/21 11/08/22 Yes Gildardo Pounds, NP  ?TRUEplus Lancets 28G MISC 1 each by Does not apply route 3 (three) times daily. 01/04/21  Yes Argentina Donovan, PA-C  ? ? ?Family History ?Family History  ?Problem Relation Age of Onset  ? Hypertension Mother   ? Diabetes Father   ? Colon polyps Sister   ? Hypertension Sister   ? Hypertension Brother   ? Throat cancer Maternal Uncle   ? Colon cancer Neg Hx   ? Esophageal cancer Neg Hx   ? Rectal cancer Neg Hx   ? Stomach cancer Neg Hx   ? ? ?Social History ?Social History  ? ?Tobacco Use  ? Smoking status: Former  ?  Packs/day: 1.00  ?  Years: 38.00  ?  Pack years: 38.00  ?  Types: Cigarettes  ?  Quit date: 05/15/2019  ?  Years since quitting: 2.6  ?  Passive exposure: Past  ? Smokeless tobacco: Never  ?Vaping Use  ? Vaping Use: Never used  ?Substance Use Topics  ? Alcohol use: Yes  ?  Alcohol/week: 5.0 standard drinks  ?  Types: 3 Glasses of wine, 2 Cans of beer per week  ?  Comment: 40 oz daily  ? Drug use: Never  ? ? ? ?Allergies   ?Patient has no known allergies. ? ? ?Review of Systems ?Review of Systems ?See HPI ? ?Physical Exam ?Triage Vital Signs ?ED Triage Vitals  ?Enc Vitals Group  ?   BP 01/14/22 1514 112/71  ?   Pulse Rate 01/14/22 1514 65  ?   Resp 01/14/22 1514 16  ?   Temp 01/14/22 1514 98.8 ?F (37.1 ?C)  ?   Temp Source 01/14/22 1514 Oral  ?    SpO2 01/14/22 1514 93 %  ?   Weight --   ?   Height --   ?   Head Circumference --   ?   Peak Flow --   ?   Pain Score 01/14/22 1511 10  ?   Pain Loc --   ?   Pain Edu? --   ?  Excl. in [place]? --   ? ?No data found. ? ?Updated Vital Signs ?BP 112/71 (BP Location: Right Arm)   Pulse 65   Temp 98.8 ?F (37.1 ?C) (Oral)   Resp 16   SpO2 93%  ?Physical Exam ?Constitutional:   ?   General: She is not in acute distress. ?   Appearance: She is well-developed and normal weight. She is ill-appearing.  ?   Comments: Voice is hoarse  ?HENT:  ?   Head: Normocephalic and atraumatic.  ?   Right Ear: Tympanic membrane and ear canal normal.  ?   Left Ear: Ear canal normal.  ?   Ears:  ?   Comments: TM is dull ?   Nose: Congestion and rhinorrhea present.  ?   Comments: Maxillary sinuses are tender ?   Mouth/Throat:  ?   Mouth: Mucous membranes are moist.  ?   Pharynx: No posterior oropharyngeal erythema.  ?Eyes:  ?   Conjunctiva/sclera: Conjunctivae normal.  ?   Pupils: Pupils are equal, round, and reactive to light.  ?Cardiovascular:  ?   Rate and Rhythm: Normal rate and regular rhythm.  ?Pulmonary:  ?   Effort: Pulmonary effort is normal. No respiratory distress.  ?   Breath sounds: Wheezing and rhonchi present.  ?   Comments: Anterior rhonchi with few scattered wheeze ?Abdominal:  ?   General: There is no distension.  ?   Palpations: Abdomen is soft.  ?Musculoskeletal:     ?   General: Normal range of motion.  ?   Cervical back: Normal range of motion.  ?Lymphadenopathy:  ?   Cervical: No cervical adenopathy.  ?Skin: ?   General: Skin is warm and dry.  ?Neurological:  ?   General: No focal deficit present.  ?   Mental Status: She is alert.  ?Psychiatric:     ?   Mood and Affect: Mood normal.     ?   Behavior: Behavior normal.  ? ? ? ?UC Treatments / Results  ?Labs ?(all labs ordered are listed, but only abnormal results are displayed) ?Labs Reviewed - No data to display ? ?EKG ? ? ?Radiology ?No results  found. ? ?Procedures ?Procedures (including critical care time) ? ?Medications Ordered in UC ?Medications - No data to display ? ?Initial Impression / Assessment and Plan / UC Course  ?I have reviewed the triage vital signs and the nu

## 2022-01-14 NOTE — Discharge Instructions (Signed)
Take the Augmentin 3 times a day ?Take the Augmentin with food ?In addition take prednisone once a day ?Drink lots of fluids call for problems ? ?

## 2022-01-14 NOTE — ED Triage Notes (Signed)
Patient presents to Urgent Care with complaints of headache, cough, yellow nasal congestion and earache since 5-6 days ago. Patient reports sharp earache. Denies any fever or chills. Tried Dayquil, Allergy pill and Tylenol.  ?

## 2022-02-01 ENCOUNTER — Emergency Department
Admission: EM | Admit: 2022-02-01 | Discharge: 2022-02-01 | Disposition: A | Discharge status: Home / Self Care | Payer: 59 | Source: Home / Self Care | Attending: Emergency Medicine | Admitting: Emergency Medicine

## 2022-02-01 ENCOUNTER — Telehealth: Payer: Self-pay | Admitting: Emergency Medicine

## 2022-02-01 ENCOUNTER — Other Ambulatory Visit: Payer: Self-pay

## 2022-02-01 ENCOUNTER — Encounter: Payer: Self-pay | Admitting: Emergency Medicine

## 2022-02-01 DIAGNOSIS — J0140 Acute pansinusitis, unspecified: Secondary | ICD-10-CM

## 2022-02-01 MED ORDER — FLUTICASONE PROPIONATE 50 MCG/ACT NA SUSP: 2.0000 | Freq: Every day | NASAL | 0 refills | Status: DC

## 2022-02-01 MED ORDER — DOXYCYCLINE HYCLATE 100 MG PO CAPS: 100.0000 mg | ORAL_CAPSULE | Freq: Two times a day (BID) | ORAL | 0 refills | Status: AC

## 2022-02-01 NOTE — ED Triage Notes (Addendum)
Nasal congestion, yellow mucus, slight cough, headache, was treated for bronchitis 3 weeks ago, started again 3 days ago. ?

## 2022-02-01 NOTE — ED Provider Notes (Signed)
HPI ? ?SUBJECTIVE: ? ?[name] is a 56 y.o. female who presents with 4 days of nasal congestion, yellow rhinorrhea, bilateral ear pain, sinus pain and pressure, postnasal drip, cough productive of the same material as her nasal congestion and sore throat starting today.  No fevers, body aches, facial swelling, abdominal pain, wheezing, shortness of breath, change in hearing, otorrhea, allergy symptoms.  She is unable to sleep at night secondary to the cough.  She reports double sickening.  She has tried Delsym, DayQuil, Tylenol/ibuprofen without improvement in her symptoms.  No aggravating factors.  She has a past medical history of hypertension, diabetes.  She is a former smoker.  No history of pulmonary disease.  She was treated with prednisone and Augmentin 3 weeks ago for bronchitis.  States that she finished the Augmentin.  [facility]: [facility]. ? ? ?Past Medical History:  ?Diagnosis Date  ? Allergy 07/27/2021  ? seasonal, environmental  ? Anxiety   ? At age of 39  ? GERD (gastroesophageal reflux disease)   ? Gestational diabetes 10/02/1991  ? when pregnant with daughter   ? Hyperlipidemia   ? Hypertension 10/01/1997  ? At age of 25  ? ? ?Past Surgical History:  ?Procedure Laterality Date  ? ABDOMINAL HYSTERECTOMY  1999  ? ? ?Family History  ?Problem Relation Age of Onset  ? Hypertension Mother   ? Diabetes Father   ? Colon polyps Sister   ? Hypertension Sister   ? Hypertension Brother   ? Throat cancer Maternal Uncle   ? Colon cancer Neg Hx   ? Esophageal cancer Neg Hx   ? Rectal cancer Neg Hx   ? Stomach cancer Neg Hx   ? ? ?Social History  ? ?Tobacco Use  ? Smoking status: Former  ?  Packs/day: 1.00  ?  Years: 38.00  ?  Pack years: 38.00  ?  Types: Cigarettes  ?  Quit date: 05/15/2019  ?  Years since quitting: 2.7  ?  Passive exposure: Past  ? Smokeless tobacco: Never  ?Vaping Use  ? Vaping Use: Never used  ?Substance Use Topics  ? Alcohol use: Yes  ?  Alcohol/week: 5.0 standard drinks  ?   Types: 3 Glasses of wine, 2 Cans of beer per week  ?  Comment: 40 oz daily  ? Drug use: Never  ? ? ?No current facility-administered medications for this encounter. ? ?Current Outpatient Medications:  ?  doxycycline (VIBRAMYCIN) 100 MG capsule, Take 1 capsule (100 mg total) by mouth 2 (two) times daily for 10 days., Disp: 20 capsule, Rfl: 0 ?  fluticasone (FLONASE) 50 MCG/ACT nasal spray, Place 2 sprays into both nostrils daily., Disp: 16 g, Rfl: 0 ?  amLODipine (NORVASC) 10 MG tablet, Take 1 tablet (10 mg total) by mouth at bedtime. To lower blood pressure, Disp: 90 tablet, Rfl: 1 ?  aspirin EC 81 MG tablet, Take 81 mg by mouth daily. Swallow whole., Disp: , Rfl:  ?  Blood Glucose Monitoring Suppl (TRUE METRIX METER) w/Device KIT, 1 each by Does not apply route as needed., Disp: 1 kit, Rfl: 0 ?  carvedilol (COREG) 12.5 MG tablet, Take 1 tablet (12.5 mg total) by mouth 2 (two) times daily with a meal., Disp: 60 tablet, Rfl: 3 ?  glucose blood (TRUE METRIX BLOOD GLUCOSE TEST) test strip, 1 each by Other route 3 (three) times daily., Disp: 100 each, Rfl: 11 ?  hydrochlorothiazide (HYDRODIURIL) 25 MG tablet, Take 1 tablet (25 mg  total) by mouth daily., Disp: 90 tablet, Rfl: 1 ?  hydrOXYzine (VISTARIL) 25 MG capsule, Take 1 capsule (25 mg total) by mouth every 8 (eight) hours as needed., Disp: 90 capsule, Rfl: 6 ?  ibuprofen (ADVIL) 400 MG tablet, Take 400 mg by mouth every 6 (six) hours as needed., Disp: , Rfl:  ?  insulin glargine, 1 Unit Dial, (TOUJEO) 300 UNIT/ML Solostar Pen, INJECT 15 UNITS INTO THE SKIN DAILY., Disp: 1.5 mL, Rfl: 5 ?  insulin glargine, 1 Unit Dial, (TOUJEO) 300 UNIT/ML Solostar Pen, Inject 15 Units into the skin daily., Disp: 4.5 mL, Rfl: 5 ?  Insulin Pen Needle (B-D ULTRAFINE III SHORT PEN) 31G X 8 MM MISC, use as directed daily, Disp: 100 each, Rfl: 3 ?  lisinopril (ZESTRIL) 40 MG tablet, TAKE 1 TABLET (40 MG TOTAL) BY MOUTH DAILY. TO LOWER BLOOD PRESSURE, Disp: 90 tablet, Rfl: 1 ?  MELATONIN  PO, Take 1 tablet by mouth at bedtime as needed (sleep)., Disp: , Rfl:  ?  omeprazole (PRILOSEC) 20 MG capsule, Take 1 capsule (20 mg total) by mouth daily., Disp: 30 capsule, Rfl: 3 ?  omeprazole (PRILOSEC) 20 MG capsule, Take 1 capsule (20 mg total) by mouth daily., Disp: 90 capsule, Rfl: 1 ?  phenylephrine (SUDAFED PE) 10 MG TABS tablet, Take 10 mg by mouth every 4 (four) hours as needed., Disp: , Rfl:  ?  rosuvastatin (CRESTOR) 20 MG tablet, Take 1 tablet (20 mg total) by mouth daily., Disp: 90 tablet, Rfl: 3 ?  traZODone (DESYREL) 100 MG tablet, Take 1 tablet (100 mg total) by mouth at bedtime., Disp: 90 tablet, Rfl: 3 ?  traZODone (DESYREL) 50 MG tablet, TAKE 1 TO 2 TABLETS BY MOUTH AT BEDTIME AS NEEDED FOR INSOMNIA, Disp: 60 tablet, Rfl: 0 ?  TRUEplus Lancets 28G MISC, 1 each by Does not apply route 3 (three) times daily., Disp: 100 each, Rfl: 11 ? ?No Known Allergies ? ? ?ROS ? ?As noted in HPI.  ? ?Physical Exam ? ?BP (!) 157/78 (BP Location: Left Arm)   Pulse 73   Temp 98.3 ?F (36.8 ?C) (Oral)   Resp 16   Ht '5\' 2"'  (1.575 m)   Wt 59.4 kg   SpO2 95%   BMI 23.96 kg/m?  ? ?Constitutional: Well developed, well nourished, no acute distress ?Eyes:  EOMI, conjunctiva normal bilaterally ?HENT: Normocephalic, atraumatic,mucus membranes moist.  TMs normal bilaterally.  Erythematous, but no swollen turbinates.  Positive mucoid nasal congestion.  Positive maxillary, frontal sinus tenderness.  Normal oropharynx, tonsils.  Uvula midline.  No obvious postnasal drip. ?Neck: No cervical lymphadenopathy ?Respiratory: Normal inspiratory effort, lungs clear bilaterally ?Cardiovascular: Normal rate, regular rhythm, no murmurs rubs or gallops ?GI: nondistended ?skin: No rash, skin intact ?Musculoskeletal: no deformities ?Neurologic: Alert & oriented x 3, no focal neuro deficits ?Psychiatric: Speech and behavior appropriate ? ? ?ED Course ? ? ?Medications - No data to display ? ?No orders of the defined types were placed  in this encounter. ? ? ?No results found for this or any previous visit (from the past 24 hour(s)). ?No results found. ? ?ED Clinical Impression ? ?1. Acute non-recurrent pansinusitis   ?  ? ?ED Assessment/Plan ? ?Previous records reviewed.  As noted in HPI. ? ?Presentation consistent with a sinusitis.  Sending home with saline nasal irrigation, Flonase, Mucinex, Promethazine DM.  She is to continue Tylenol and ibuprofen as needed for pain.  Sending home with a wait-and-see prescription of doxycycline due to the  double sickening.  Discussed indications for filling this.  Follow-up with PCP as needed. ? ?Discussed  MDM, treatment plan, and plan for follow-up with patient. patient agrees with plan.  ? ?Meds ordered this encounter  ?Medications  ? doxycycline (VIBRAMYCIN) 100 MG capsule  ?  Sig: Take 1 capsule (100 mg total) by mouth 2 (two) times daily for 10 days.  ?  Dispense:  20 capsule  ?  Refill:  0  ? fluticasone (FLONASE) 50 MCG/ACT nasal spray  ?  Sig: Place 2 sprays into both nostrils daily.  ?  Dispense:  16 g  ?  Refill:  0  ? ? ? ? ?*This clinic note was created using [employer]. Therefore, there may be occasional mistakes despite careful proofreading. ? ?? ? ?  ?Melynda Ripple, MD ?02/02/22 1235 ? ?

## 2022-02-01 NOTE — Discharge Instructions (Addendum)
Start Mucinex-D to keep the mucous thin and to decongest you.   You may take 600 mg of motrin with 1000 mg of tylenol up to 3-4 times a day as needed for pain. This is an effective combination for pain.  Most sinus infections are viral and do not need antibiotics unless you have a high fever, have had this for 10 days, or you get better and then get sick again. Use a NeilMed sinus rinse with distilled water as often as you want to to reduce nasal congestion. Follow the directions on the box.  ? ?Go to www.goodrx.com to look up your medications. This will give you a list of where you can find your prescriptions at the most affordable prices. Or you can ask the pharmacist what the cash price is. This is frequently cheaper than going through insurance.   ?

## 2022-02-01 NOTE — Telephone Encounter (Signed)
Patient wanted called into [facility], done 5:54 ?Mandy Manuela Halbur,CMA ?

## 2022-02-07 ENCOUNTER — Other Ambulatory Visit: Payer: Self-pay

## 2022-02-14 ENCOUNTER — Other Ambulatory Visit: Payer: Self-pay

## 2022-02-15 ENCOUNTER — Other Ambulatory Visit: Payer: Self-pay

## 2022-02-20 ENCOUNTER — Other Ambulatory Visit: Payer: Self-pay

## 2022-02-21 ENCOUNTER — Other Ambulatory Visit: Payer: Self-pay

## 2022-02-28 ENCOUNTER — Other Ambulatory Visit: Payer: Self-pay

## 2022-03-01 ENCOUNTER — Other Ambulatory Visit: Payer: Self-pay

## 2022-04-11 ENCOUNTER — Other Ambulatory Visit: Payer: Self-pay

## 2022-04-11 ENCOUNTER — Other Ambulatory Visit: Payer: Self-pay | Admitting: Nurse Practitioner

## 2022-04-11 DIAGNOSIS — I10 Essential (primary) hypertension: Secondary | ICD-10-CM

## 2022-04-11 MED ORDER — CARVEDILOL 12.5 MG PO TABS
12.5000 mg | ORAL_TABLET | Freq: Two times a day (BID) | ORAL | 0 refills | Status: DC
  Filled 2022-04-11: qty 60, 30d supply, fill #0

## 2022-04-12 ENCOUNTER — Other Ambulatory Visit: Payer: Self-pay

## 2022-04-23 ENCOUNTER — Other Ambulatory Visit: Payer: Self-pay

## 2022-04-26 ENCOUNTER — Other Ambulatory Visit: Payer: Self-pay

## 2022-05-10 ENCOUNTER — Other Ambulatory Visit: Payer: Self-pay

## 2022-05-10 ENCOUNTER — Other Ambulatory Visit: Payer: Self-pay | Admitting: Family Medicine

## 2022-05-10 DIAGNOSIS — I10 Essential (primary) hypertension: Secondary | ICD-10-CM

## 2022-05-10 MED ORDER — CARVEDILOL 12.5 MG PO TABS
12.5000 mg | ORAL_TABLET | Freq: Two times a day (BID) | ORAL | 0 refills | Status: AC
  Filled 2022-05-10: qty 60, 30d supply, fill #0

## 2022-05-15 ENCOUNTER — Other Ambulatory Visit: Payer: Self-pay

## 2022-05-16 ENCOUNTER — Other Ambulatory Visit: Payer: Self-pay

## 2022-05-23 ENCOUNTER — Other Ambulatory Visit: Payer: Self-pay | Admitting: Nurse Practitioner

## 2022-05-23 ENCOUNTER — Other Ambulatory Visit: Payer: Self-pay

## 2022-05-23 ENCOUNTER — Other Ambulatory Visit (HOSPITAL_COMMUNITY): Payer: Self-pay

## 2022-05-23 ENCOUNTER — Other Ambulatory Visit: Payer: Self-pay | Admitting: Family Medicine

## 2022-05-23 DIAGNOSIS — I10 Essential (primary) hypertension: Secondary | ICD-10-CM

## 2022-05-23 DIAGNOSIS — E119 Type 2 diabetes mellitus without complications: Secondary | ICD-10-CM

## 2022-05-23 DIAGNOSIS — F5101 Primary insomnia: Secondary | ICD-10-CM

## 2022-05-23 DIAGNOSIS — K219 Gastro-esophageal reflux disease without esophagitis: Secondary | ICD-10-CM

## 2022-05-23 MED ORDER — TRAZODONE HCL 50 MG PO TABS
ORAL_TABLET | ORAL | 0 refills | Status: AC
  Filled 2022-05-23: qty 60, 30d supply, fill #0

## 2022-05-23 MED ORDER — LISINOPRIL 40 MG PO TABS
ORAL_TABLET | ORAL | 0 refills | Status: AC
  Filled 2022-05-23: qty 30, fill #0

## 2022-05-23 MED ORDER — OMEPRAZOLE 20 MG PO CPDR
20.0000 mg | DELAYED_RELEASE_CAPSULE | Freq: Every day | ORAL | 0 refills | Status: AC
  Filled 2022-05-23: qty 30, 30d supply, fill #0

## 2022-05-23 MED ORDER — TRUE METRIX BLOOD GLUCOSE TEST VI STRP
1.0000 | ORAL_STRIP | Freq: Three times a day (TID) | 0 refills | Status: AC
  Filled 2022-05-23: qty 100, 34d supply, fill #0

## 2022-05-24 ENCOUNTER — Other Ambulatory Visit (HOSPITAL_COMMUNITY): Payer: Self-pay

## 2022-05-24 ENCOUNTER — Other Ambulatory Visit: Payer: Self-pay

## 2022-05-25 ENCOUNTER — Other Ambulatory Visit (HOSPITAL_COMMUNITY): Payer: Self-pay

## 2022-06-29 DIAGNOSIS — K219 Gastro-esophageal reflux disease without esophagitis: Secondary | ICD-10-CM | POA: Insufficient documentation

## 2022-06-29 DIAGNOSIS — Z90710 Acquired absence of both cervix and uterus: Secondary | ICD-10-CM | POA: Insufficient documentation

## 2022-10-22 DIAGNOSIS — G8929 Other chronic pain: Secondary | ICD-10-CM | POA: Insufficient documentation

## 2022-10-22 DIAGNOSIS — M545 Low back pain, unspecified: Secondary | ICD-10-CM | POA: Insufficient documentation

## 2022-12-17 ENCOUNTER — Inpatient Hospital Stay (HOSPITAL_BASED_OUTPATIENT_CLINIC_OR_DEPARTMENT_OTHER)
Admission: EM | Admit: 2022-12-17 | Discharge: 2022-12-20 | DRG: 439 | Disposition: A | Discharge status: Home / Self Care | Payer: BLUE CROSS/BLUE SHIELD | Attending: Internal Medicine | Admitting: Internal Medicine

## 2022-12-17 ENCOUNTER — Other Ambulatory Visit: Payer: Self-pay

## 2022-12-17 ENCOUNTER — Encounter (HOSPITAL_BASED_OUTPATIENT_CLINIC_OR_DEPARTMENT_OTHER): Payer: Self-pay

## 2022-12-17 ENCOUNTER — Emergency Department (HOSPITAL_BASED_OUTPATIENT_CLINIC_OR_DEPARTMENT_OTHER): Payer: BLUE CROSS/BLUE SHIELD

## 2022-12-17 DIAGNOSIS — K219 Gastro-esophageal reflux disease without esophagitis: Secondary | ICD-10-CM | POA: Diagnosis present

## 2022-12-17 DIAGNOSIS — F1010 Alcohol abuse, uncomplicated: Secondary | ICD-10-CM | POA: Diagnosis present

## 2022-12-17 DIAGNOSIS — E871 Hypo-osmolality and hyponatremia: Secondary | ICD-10-CM | POA: Diagnosis present

## 2022-12-17 DIAGNOSIS — Z7982 Long term (current) use of aspirin: Secondary | ICD-10-CM

## 2022-12-17 DIAGNOSIS — E782 Mixed hyperlipidemia: Secondary | ICD-10-CM | POA: Diagnosis present

## 2022-12-17 DIAGNOSIS — Z833 Family history of diabetes mellitus: Secondary | ICD-10-CM

## 2022-12-17 DIAGNOSIS — K7010 Alcoholic hepatitis without ascites: Secondary | ICD-10-CM | POA: Diagnosis present

## 2022-12-17 DIAGNOSIS — N3001 Acute cystitis with hematuria: Principal | ICD-10-CM

## 2022-12-17 DIAGNOSIS — Z8249 Family history of ischemic heart disease and other diseases of the circulatory system: Secondary | ICD-10-CM

## 2022-12-17 DIAGNOSIS — Z794 Long term (current) use of insulin: Secondary | ICD-10-CM

## 2022-12-17 DIAGNOSIS — N179 Acute kidney failure, unspecified: Secondary | ICD-10-CM

## 2022-12-17 DIAGNOSIS — K852 Alcohol induced acute pancreatitis without necrosis or infection: Principal | ICD-10-CM | POA: Diagnosis present

## 2022-12-17 DIAGNOSIS — R7401 Elevation of levels of liver transaminase levels: Secondary | ICD-10-CM

## 2022-12-17 DIAGNOSIS — R310 Gross hematuria: Secondary | ICD-10-CM | POA: Diagnosis present

## 2022-12-17 DIAGNOSIS — Z8551 Personal history of malignant neoplasm of bladder: Secondary | ICD-10-CM

## 2022-12-17 DIAGNOSIS — E119 Type 2 diabetes mellitus without complications: Secondary | ICD-10-CM | POA: Diagnosis present

## 2022-12-17 DIAGNOSIS — E878 Other disorders of electrolyte and fluid balance, not elsewhere classified: Secondary | ICD-10-CM | POA: Diagnosis present

## 2022-12-17 DIAGNOSIS — F419 Anxiety disorder, unspecified: Secondary | ICD-10-CM | POA: Diagnosis present

## 2022-12-17 DIAGNOSIS — Z87891 Personal history of nicotine dependence: Secondary | ICD-10-CM

## 2022-12-17 DIAGNOSIS — Z79899 Other long term (current) drug therapy: Secondary | ICD-10-CM

## 2022-12-17 DIAGNOSIS — K859 Acute pancreatitis without necrosis or infection, unspecified: Secondary | ICD-10-CM | POA: Diagnosis present

## 2022-12-17 DIAGNOSIS — E876 Hypokalemia: Secondary | ICD-10-CM | POA: Diagnosis present

## 2022-12-17 DIAGNOSIS — I10 Essential (primary) hypertension: Secondary | ICD-10-CM | POA: Diagnosis present

## 2022-12-17 DIAGNOSIS — C679 Malignant neoplasm of bladder, unspecified: Secondary | ICD-10-CM | POA: Diagnosis present

## 2022-12-17 DIAGNOSIS — N3289 Other specified disorders of bladder: Secondary | ICD-10-CM | POA: Diagnosis present

## 2022-12-17 DIAGNOSIS — Z808 Family history of malignant neoplasm of other organs or systems: Secondary | ICD-10-CM

## 2022-12-17 DIAGNOSIS — F1020 Alcohol dependence, uncomplicated: Secondary | ICD-10-CM | POA: Diagnosis present

## 2022-12-17 DIAGNOSIS — R1013 Epigastric pain: Secondary | ICD-10-CM | POA: Diagnosis not present

## 2022-12-17 HISTORY — DX: Type 2 diabetes mellitus without complications: E11.9

## 2022-12-17 LAB — COMPREHENSIVE METABOLIC PANEL
ALT: 67 U/L — ABNORMAL HIGH (ref 0–44)
AST: 55 U/L — ABNORMAL HIGH (ref 15–41)
Albumin: 4.6 g/dL (ref 3.5–5.0)
Alkaline Phosphatase: 72 U/L (ref 38–126)
Anion gap: 9 (ref 5–15)
BUN: 47 mg/dL — ABNORMAL HIGH (ref 6–20)
CO2: 25 mmol/L (ref 22–32)
Calcium: 9.7 mg/dL (ref 8.9–10.3)
Chloride: 95 mmol/L — ABNORMAL LOW (ref 98–111)
Creatinine, Ser: 1.07 mg/dL — ABNORMAL HIGH (ref 0.44–1.00)
GFR, Estimated: >60 mL/min (ref >60)
Glucose, Bld: 189 mg/dL — ABNORMAL HIGH (ref 70–99)
Potassium: 3 mmol/L — ABNORMAL LOW (ref 3.5–5.1)
Sodium: 129 mmol/L — ABNORMAL LOW (ref 135–145)
Total Bilirubin: 1.2 mg/dL (ref 0.3–1.2)
Total Protein: 8.2 g/dL — ABNORMAL HIGH (ref 6.5–8.1)

## 2022-12-17 LAB — URINALYSIS, ROUTINE W REFLEX MICROSCOPIC
Glucose, UA: NEGATIVE mg/dL
Ketones, ur: 15 mg/dL — AB
Nitrite: POSITIVE — AB
Protein, ur: >=300 mg/dL — AB
Specific Gravity, Urine: 1.015 (ref 1.005–1.030)
pH: 5.5 (ref 5.0–8.0)

## 2022-12-17 LAB — CBC
HCT: 35.6 % — ABNORMAL LOW (ref 36.0–46.0)
Hemoglobin: 12.7 g/dL (ref 12.0–15.0)
MCH: 31.8 pg (ref 26.0–34.0)
MCHC: 35.7 g/dL (ref 30.0–36.0)
MCV: 89 fL (ref 80.0–100.0)
Platelets: 164 10*3/uL (ref 150–400)
RBC: 4 MIL/uL (ref 3.87–5.11)
RDW: 12.4 % (ref 11.5–15.5)
WBC: 6.4 10*3/uL (ref 4.0–10.5)
nRBC: 0 % (ref 0.0–0.2)

## 2022-12-17 LAB — LIPASE, BLOOD: Lipase: 283 U/L — ABNORMAL HIGH (ref 11–51)

## 2022-12-17 LAB — URINALYSIS, MICROSCOPIC (REFLEX): RBC / HPF: >50 RBC/hpf (ref 0–5)

## 2022-12-17 MED ORDER — HYDROMORPHONE HCL 1 MG/ML IJ SOLN
0.5000 mg | Freq: Once | INTRAMUSCULAR | Status: AC
  Administered 2022-12-17: 0.5 mg via INTRAVENOUS
  Filled 2022-12-17: qty 1

## 2022-12-17 MED ORDER — MORPHINE SULFATE (PF) 4 MG/ML IV SOLN
4.0000 mg | Freq: Once | INTRAVENOUS | Status: AC
  Administered 2022-12-17: 4 mg via INTRAVENOUS
  Filled 2022-12-17: qty 1

## 2022-12-17 MED ORDER — SODIUM CHLORIDE 0.9 % IV BOLUS
1000.0000 mL | Freq: Once | INTRAVENOUS | Status: AC
  Administered 2022-12-17: 1000 mL via INTRAVENOUS

## 2022-12-17 MED ORDER — POTASSIUM CHLORIDE 10 MEQ/100ML IV SOLN
10.0000 meq | INTRAVENOUS | Status: AC
  Administered 2022-12-17 – 2022-12-18 (×3): 10 meq via INTRAVENOUS
  Filled 2022-12-17 (×3): qty 100

## 2022-12-17 MED ORDER — IOHEXOL 300 MG/ML  SOLN
100.0000 mL | Freq: Once | INTRAMUSCULAR | Status: AC | PRN
  Administered 2022-12-17: 100 mL via INTRAVENOUS

## 2022-12-17 MED ORDER — ONDANSETRON HCL 4 MG/2ML IJ SOLN
4.0000 mg | Freq: Once | INTRAMUSCULAR | Status: AC
  Administered 2022-12-17: 4 mg via INTRAVENOUS
  Filled 2022-12-17: qty 2

## 2022-12-17 MED ORDER — SODIUM CHLORIDE 0.9 % IV SOLN
1.0000 g | Freq: Once | INTRAVENOUS | Status: AC
  Administered 2022-12-17: 1 g via INTRAVENOUS
  Filled 2022-12-17: qty 10

## 2022-12-17 NOTE — ED Notes (Signed)
US at bedside

## 2022-12-17 NOTE — ED Provider Notes (Signed)
[facility] Provider Note   CSN: [id number] Arrival date & time: 12/17/22  1705     History {Add pertinent medical, surgical, social history, OB history to HPI:1} Chief Complaint  Patient presents with   Abdominal Pain    [name] is a 57 y.o. female with T2DM, HTN, EtOH abuse, HLD, alcoholic hepatitis who presents with abdominal pain.   Pt c/o 10/10 constant abdominal pain/epigastric pain that started on Thursday. Did have an episode of nausea/vomiting as well prior to the abdominal pain. Also has noted bright red blood in urine that started on Tuesday, a/w dysuria. No vaginal or rectal bleeding. Denies fevers but endorses chills. Still does feel nauseated. No history of gallstones. No ameliorating/exacerbating factors. Last BM was 6 days ago, hasn't had a bowel movement since then.    Abdominal Pain      Home Medications Prior to Admission medications   Medication Sig Start Date End Date Taking? Authorizing Provider  amLODipine (NORVASC) 10 MG tablet Take 1 tablet (10 mg total) by mouth at bedtime. To lower blood pressure 11/10/21   Gildardo Pounds, NP  aspirin EC 81 MG tablet Take 81 mg by mouth daily. Swallow whole.    [provider]  Blood Glucose Monitoring Suppl (TRUE METRIX METER) w/Device KIT 1 each by Does not apply route as needed. 01/04/21   Argentina Donovan, PA-C  carvedilol (COREG) 12.5 MG tablet Take 1 tablet (12.5 mg total) by mouth 2 (two) times daily with a meal. 05/10/22   Newlin, Charlane Ferretti, MD  fluticasone (FLONASE) 50 MCG/ACT nasal spray Place 2 sprays into both nostrils daily. 02/01/22   Melynda Ripple, MD  glucose blood (TRUE METRIX BLOOD GLUCOSE TEST) test strip Use as directed to test blood sugar 3 (three) times daily. 05/23/22   Charlott Rakes, MD  hydrochlorothiazide (HYDRODIURIL) 25 MG tablet Take 1 tablet (25 mg total) by mouth daily. 11/10/21 08/26/22  Gildardo Pounds, NP  hydrOXYzine (VISTARIL)  25 MG capsule Take 1 capsule (25 mg total) by mouth every 8 (eight) hours as needed. 11/10/21   Gildardo Pounds, NP  ibuprofen (ADVIL) 400 MG tablet Take 400 mg by mouth every 6 (six) hours as needed.    [provider]  insulin glargine, 1 Unit Dial, (TOUJEO) 300 UNIT/ML Solostar Pen INJECT 15 UNITS INTO THE SKIN DAILY. 06/20/21 06/20/22  Gildardo Pounds, NP  insulin glargine, 1 Unit Dial, (TOUJEO) 300 UNIT/ML Solostar Pen Inject 15 Units into the skin daily. 11/10/21 09/03/22  Gildardo Pounds, NP  Insulin Pen Needle (B-D ULTRAFINE III SHORT PEN) 31G X 8 MM MISC use as directed daily 06/20/21   Gildardo Pounds, NP  lisinopril (ZESTRIL) 40 MG tablet TAKE 1 TABLET (40 MG TOTAL) BY MOUTH DAILY TO LOWER BLOOD PRESSURE 05/23/22   Charlott Rakes, MD  MELATONIN PO Take 1 tablet by mouth at bedtime as needed (sleep).    [provider]  omeprazole (PRILOSEC) 20 MG capsule Take 1 capsule (20 mg total) by mouth daily. 05/23/22   Charlott Rakes, MD  phenylephrine (SUDAFED PE) 10 MG TABS tablet Take 10 mg by mouth every 4 (four) hours as needed.    [provider]  rosuvastatin (CRESTOR) 20 MG tablet Take 1 tablet (20 mg total) by mouth daily. 11/10/21   Gildardo Pounds, NP  traZODone (DESYREL) 50 MG tablet TAKE 1 TO 2 TABLETS BY MOUTH AT BEDTIME AS NEEDED FOR INSOMNIA 05/23/22   Newlin,  Enobong, MD  TRUEplus Lancets 28G MISC 1 each by Does not apply route 3 (three) times daily. 01/04/21   Argentina Donovan, PA-C      Allergies    Patient has no known allergies.    Review of Systems   Review of Systems  Gastrointestinal:  Positive for abdominal pain.   Review of systems Positive for chils.  A 10 point review of systems was performed and is negative unless otherwise reported in HPI.  Physical Exam Updated Vital Signs BP 110/89 (BP Location: Right Arm)   Pulse 81   Temp 98 F (36.7 C)   Resp 18   Ht 5\' 2"  (1.575 m)   Wt 64.9 kg   SpO2 100%   BMI 26.16 kg/m  Physical  Exam General: Normal appearing female, lying in bed.  HEENT: Sclera anicteric, MMM, trachea midline.  Cardiology: RRR, no murmurs/rubs/gallops. BL radial and DP pulses equal bilaterally.  Resp: Normal respiratory rate and effort. CTAB, no wheezes, rhonchi, crackles.  Abd: Soft, non-distended. No rebound tenderness or guarding.  GU: Deferred. MSK: No peripheral edema or signs of trauma. Extremities without deformity or TTP. No cyanosis or clubbing. Skin: warm, dry. No rashes or lesions. Back: +BL CVA tenderness Neuro: A&Ox4, CNs II-XII grossly intact. MAEs. Sensation grossly intact.  Psych: Normal mood and affect.   ED Results / Procedures / Treatments   Labs (all labs ordered are listed, but only abnormal results are displayed) Labs Reviewed  LIPASE, BLOOD - Abnormal; Notable for the following components:      Result Value   Lipase 283 (*)    All other components within normal limits  COMPREHENSIVE METABOLIC PANEL - Abnormal; Notable for the following components:   Sodium 129 (*)    Potassium 3.0 (*)    Chloride 95 (*)    Glucose, Bld 189 (*)    BUN 47 (*)    Creatinine, Ser 1.07 (*)    Total Protein 8.2 (*)    AST 55 (*)    ALT 67 (*)    All other components within normal limits  CBC - Abnormal; Notable for the following components:   HCT 35.6 (*)    All other components within normal limits  URINALYSIS, ROUTINE W REFLEX MICROSCOPIC - Abnormal; Notable for the following components:   Color, Urine RED (*)    APPearance CLOUDY (*)    Hgb urine dipstick LARGE (*)    Bilirubin Urine LARGE (*)    Ketones, ur 15 (*)    Protein, ur >=300 (*)    Nitrite POSITIVE (*)    Leukocytes,Ua LARGE (*)    All other components within normal limits  URINALYSIS, MICROSCOPIC (REFLEX) - Abnormal; Notable for the following components:   Bacteria, UA FEW (*)    All other components within normal limits    EKG None  Radiology No results found.  Procedures Procedures  {Document  cardiac monitor, telemetry assessment procedure when appropriate:1}  Medications Ordered in ED Medications  cefTRIAXone (ROCEPHIN) 1 g in sodium chloride 0.9 % 100 mL IVPB (has no administration in time range)    ED Course/ Medical Decision Making/ A&P                          Medical Decision Making Amount and/or Complexity of Data Reviewed Labs: ordered. Decision-making details documented in ED Course. Radiology: ordered.    This patient presents to the ED for concern of abdominal pain and hematuria,  this involves an extensive number of treatment options, and is a complaint that carries with it a high risk of complications and morbidity.  I considered the following differential and admission for this acute, potentially life threatening condition.   MDM:    For DDX for abdominal pain includes but is not limited to:  Abdominal exam without peritoneal signs. No evidence of acute abdomen at this time. With epigastric/RUQ pain consider pancreatitis, cholecystitis/cholelithiasis, GERD/PUD/gastritis. With blood in urine also consider cystitis, with associated bilateral flank pain consider pyelonephritis. Acute appendicitis, bowel obstruction, diverticulitis.    Clinical Course as of 12/17/22 2023  Mon Dec 17, 2022  2012 Sodium(!): 129 [HN]  2012 Potassium(!): 3.0 [HN]  2012 BUN(!): 47 [HN]  2012 Creatinine(!): 1.07 [HN]  2012 Urinalysis, Routine w reflex microscopic -Urine, Clean Catch(!) +UTI [HN]  2013 Lipase(!): 283 [HN]    Clinical Course User Index [HN] Audley Hose, MD    Labs: I Ordered, and personally interpreted labs.  The pertinent results include:  those listed above  Imaging Studies ordered: I ordered imaging studies including CT abd pelvis, RUQ [place] I independently visualized and interpreted imaging. I agree with the radiologist interpretation  Additional history obtained from chart review.   Reevaluation: After the interventions noted above, I reevaluated  the patient and found that they have :{resolved/improved/worsened:23923::"improved"}  Social Determinants of Health: Patient lives independently   Disposition:  ***  Co morbidities that complicate the patient evaluation  Past Medical History:  Diagnosis Date   Allergy 07/27/2021   seasonal, environmental   Anxiety    At age of 48   GERD (gastroesophageal reflux disease)    Gestational diabetes 10/02/1991   when pregnant with daughter    Hyperlipidemia    Hypertension 10/01/1997   At age of 75     Medicines Meds ordered this encounter  Medications   cefTRIAXone (ROCEPHIN) 1 g in sodium chloride 0.9 % 100 mL IVPB    Order Specific Question:   Antibiotic Indication:    Answer:   UTI    I have reviewed the patients home medicines and have made adjustments as needed  Problem List / ED Course: Problem List Items Addressed This Visit   None        {Document critical care time when appropriate:1} {Document review of labs and clinical decision tools ie heart score, Chads2Vasc2 etc:1}  {Document your independent review of radiology images, and any outside records:1} {Document your discussion with family members, caretakers, and with consultants:1} {Document social determinants of health affecting pt's care:1} {Document your decision making why or why not admission, treatments were needed:1}  This note was created using dictation software, which may contain spelling or grammatical errors.

## 2022-12-17 NOTE — ED Notes (Signed)
Patient transported to CT 

## 2022-12-17 NOTE — ED Triage Notes (Signed)
Pt c/o abdominal pain/epigastric pain and blood in urine that started Thursday.

## 2022-12-17 NOTE — ED Notes (Signed)
ED Provider at bedside. 

## 2022-12-18 ENCOUNTER — Other Ambulatory Visit: Payer: Self-pay

## 2022-12-18 ENCOUNTER — Encounter (HOSPITAL_COMMUNITY): Payer: Self-pay | Admitting: Internal Medicine

## 2022-12-18 DIAGNOSIS — K852 Alcohol induced acute pancreatitis without necrosis or infection: Secondary | ICD-10-CM

## 2022-12-18 DIAGNOSIS — N179 Acute kidney failure, unspecified: Secondary | ICD-10-CM | POA: Diagnosis not present

## 2022-12-18 DIAGNOSIS — Z87891 Personal history of nicotine dependence: Secondary | ICD-10-CM | POA: Diagnosis not present

## 2022-12-18 DIAGNOSIS — E876 Hypokalemia: Secondary | ICD-10-CM | POA: Diagnosis present

## 2022-12-18 DIAGNOSIS — C679 Malignant neoplasm of bladder, unspecified: Secondary | ICD-10-CM | POA: Diagnosis present

## 2022-12-18 DIAGNOSIS — N3001 Acute cystitis with hematuria: Secondary | ICD-10-CM | POA: Diagnosis not present

## 2022-12-18 DIAGNOSIS — Z8249 Family history of ischemic heart disease and other diseases of the circulatory system: Secondary | ICD-10-CM | POA: Diagnosis not present

## 2022-12-18 DIAGNOSIS — I10 Essential (primary) hypertension: Secondary | ICD-10-CM | POA: Diagnosis present

## 2022-12-18 DIAGNOSIS — F419 Anxiety disorder, unspecified: Secondary | ICD-10-CM | POA: Diagnosis present

## 2022-12-18 DIAGNOSIS — Z79899 Other long term (current) drug therapy: Secondary | ICD-10-CM | POA: Diagnosis not present

## 2022-12-18 DIAGNOSIS — Z808 Family history of malignant neoplasm of other organs or systems: Secondary | ICD-10-CM | POA: Diagnosis not present

## 2022-12-18 DIAGNOSIS — K219 Gastro-esophageal reflux disease without esophagitis: Secondary | ICD-10-CM | POA: Diagnosis present

## 2022-12-18 DIAGNOSIS — Z7982 Long term (current) use of aspirin: Secondary | ICD-10-CM | POA: Diagnosis not present

## 2022-12-18 DIAGNOSIS — F1020 Alcohol dependence, uncomplicated: Secondary | ICD-10-CM | POA: Diagnosis present

## 2022-12-18 DIAGNOSIS — E782 Mixed hyperlipidemia: Secondary | ICD-10-CM | POA: Diagnosis present

## 2022-12-18 DIAGNOSIS — K7010 Alcoholic hepatitis without ascites: Secondary | ICD-10-CM | POA: Diagnosis present

## 2022-12-18 DIAGNOSIS — E871 Hypo-osmolality and hyponatremia: Secondary | ICD-10-CM | POA: Diagnosis present

## 2022-12-18 DIAGNOSIS — Z8551 Personal history of malignant neoplasm of bladder: Secondary | ICD-10-CM | POA: Diagnosis not present

## 2022-12-18 DIAGNOSIS — E878 Other disorders of electrolyte and fluid balance, not elsewhere classified: Secondary | ICD-10-CM | POA: Diagnosis present

## 2022-12-18 DIAGNOSIS — Z794 Long term (current) use of insulin: Secondary | ICD-10-CM | POA: Diagnosis not present

## 2022-12-18 DIAGNOSIS — R1013 Epigastric pain: Secondary | ICD-10-CM | POA: Diagnosis present

## 2022-12-18 DIAGNOSIS — N3289 Other specified disorders of bladder: Secondary | ICD-10-CM | POA: Diagnosis present

## 2022-12-18 DIAGNOSIS — R310 Gross hematuria: Secondary | ICD-10-CM | POA: Diagnosis present

## 2022-12-18 DIAGNOSIS — Z833 Family history of diabetes mellitus: Secondary | ICD-10-CM | POA: Diagnosis not present

## 2022-12-18 DIAGNOSIS — E119 Type 2 diabetes mellitus without complications: Secondary | ICD-10-CM | POA: Diagnosis present

## 2022-12-18 LAB — HEMOGLOBIN A1C
Hgb A1c MFr Bld: 6.2 % — ABNORMAL HIGH (ref 4.8–5.6)
Mean Plasma Glucose: 131 mg/dL

## 2022-12-18 LAB — GLUCOSE, CAPILLARY
Glucose-Capillary: 127 mg/dL — ABNORMAL HIGH (ref 70–99)
Glucose-Capillary: 103 mg/dL — ABNORMAL HIGH (ref 70–99)
Glucose-Capillary: 104 mg/dL — ABNORMAL HIGH (ref 70–99)

## 2022-12-18 LAB — HIV ANTIBODY (ROUTINE TESTING W REFLEX): HIV Screen 4th Generation wRfx: NONREACTIVE

## 2022-12-18 MED ORDER — ASPIRIN 81 MG PO TBEC
81.0000 mg | DELAYED_RELEASE_TABLET | Freq: Every day | ORAL | Status: DC
  Administered 2022-12-18: 81 mg via ORAL
  Filled 2022-12-18: qty 1

## 2022-12-18 MED ORDER — ONDANSETRON HCL 4 MG PO TABS: 4.0000 mg | ORAL_TABLET | Freq: Four times a day (QID) | ORAL | Status: DC | PRN

## 2022-12-18 MED ORDER — ACETAMINOPHEN 325 MG PO TABS: 650.0000 mg | ORAL_TABLET | Freq: Four times a day (QID) | ORAL | Status: DC | PRN

## 2022-12-18 MED ORDER — INSULIN GLARGINE-YFGN 100 UNIT/ML ~~LOC~~ SOLN
15.0000 [IU] | Freq: Every day | SUBCUTANEOUS | Status: DC
  Administered 2022-12-20: 15 [IU] via SUBCUTANEOUS
  Filled 2022-12-18 (×3): qty 0.15

## 2022-12-18 MED ORDER — THIAMINE MONONITRATE 100 MG PO TABS
100.0000 mg | ORAL_TABLET | Freq: Every day | ORAL | Status: DC
  Administered 2022-12-18 – 2022-12-20 (×3): 100 mg via ORAL
  Filled 2022-12-18 (×3): qty 1

## 2022-12-18 MED ORDER — ROSUVASTATIN CALCIUM 20 MG PO TABS
20.0000 mg | ORAL_TABLET | Freq: Every day | ORAL | Status: DC
  Administered 2022-12-18: 20 mg via ORAL
  Filled 2022-12-18: qty 1

## 2022-12-18 MED ORDER — CARVEDILOL 12.5 MG PO TABS
12.5000 mg | ORAL_TABLET | Freq: Two times a day (BID) | ORAL | Status: DC
  Administered 2022-12-18 – 2022-12-20 (×4): 12.5 mg via ORAL
  Filled 2022-12-18 (×4): qty 1

## 2022-12-18 MED ORDER — HYDROXYZINE HCL 10 MG PO TABS
25.0000 mg | ORAL_TABLET | Freq: Three times a day (TID) | ORAL | Status: DC | PRN
  Administered 2022-12-18 – 2022-12-19 (×4): 25 mg via ORAL
  Filled 2022-12-18 (×4): qty 3

## 2022-12-18 MED ORDER — GABAPENTIN 300 MG PO CAPS
300.0000 mg | ORAL_CAPSULE | Freq: Two times a day (BID) | ORAL | Status: DC
  Administered 2022-12-19 – 2022-12-20 (×4): 300 mg via ORAL
  Filled 2022-12-18 (×4): qty 1

## 2022-12-18 MED ORDER — MORPHINE SULFATE (PF) 2 MG/ML IV SOLN
2.0000 mg | INTRAVENOUS | Status: DC | PRN
  Administered 2022-12-18 – 2022-12-19 (×9): 2 mg via INTRAVENOUS
  Filled 2022-12-18 (×9): qty 1

## 2022-12-18 MED ORDER — FLUTICASONE PROPIONATE 50 MCG/ACT NA SUSP
2.0000 | Freq: Every day | NASAL | Status: DC
  Administered 2022-12-18 – 2022-12-20 (×3): 2 via NASAL
  Filled 2022-12-18: qty 16

## 2022-12-18 MED ORDER — HYDROMORPHONE HCL 1 MG/ML IJ SOLN
1.0000 mg | Freq: Once | INTRAMUSCULAR | Status: AC
  Administered 2022-12-18: 1 mg via INTRAVENOUS
  Filled 2022-12-18: qty 1

## 2022-12-18 MED ORDER — TRAZODONE HCL 50 MG PO TABS
50.0000 mg | ORAL_TABLET | Freq: Every evening | ORAL | Status: DC | PRN
  Administered 2022-12-19: 100 mg via ORAL
  Administered 2022-12-19: 50 mg via ORAL
  Filled 2022-12-18: qty 2
  Filled 2022-12-18: qty 1

## 2022-12-18 MED ORDER — POTASSIUM CHLORIDE CRYS ER 20 MEQ PO TBCR
40.0000 meq | EXTENDED_RELEASE_TABLET | Freq: Once | ORAL | Status: AC
  Administered 2022-12-18: 40 meq via ORAL
  Filled 2022-12-18: qty 2

## 2022-12-18 MED ORDER — THIAMINE HCL 100 MG/ML IJ SOLN
100.0000 mg | Freq: Every day | INTRAMUSCULAR | Status: DC
  Filled 2022-12-18 (×2): qty 2

## 2022-12-18 MED ORDER — ADULT MULTIVITAMIN W/MINERALS CH
1.0000 | ORAL_TABLET | Freq: Every day | ORAL | Status: DC
  Administered 2022-12-18 – 2022-12-20 (×3): 1 via ORAL
  Filled 2022-12-18 (×3): qty 1

## 2022-12-18 MED ORDER — HYDROMORPHONE HCL 1 MG/ML IJ SOLN
1.0000 mg | INTRAMUSCULAR | Status: DC | PRN
  Administered 2022-12-18 (×3): 1 mg via INTRAVENOUS
  Filled 2022-12-18 (×3): qty 1

## 2022-12-18 MED ORDER — ONDANSETRON HCL 4 MG/2ML IJ SOLN
4.0000 mg | Freq: Four times a day (QID) | INTRAMUSCULAR | Status: DC | PRN
  Administered 2022-12-18 – 2022-12-19 (×3): 4 mg via INTRAVENOUS
  Filled 2022-12-18 (×3): qty 2

## 2022-12-18 MED ORDER — FOLIC ACID 1 MG PO TABS
1.0000 mg | ORAL_TABLET | Freq: Every day | ORAL | Status: DC
  Administered 2022-12-18 – 2022-12-20 (×3): 1 mg via ORAL
  Filled 2022-12-18 (×3): qty 1

## 2022-12-18 MED ORDER — ACETAMINOPHEN 650 MG RE SUPP: 650.0000 mg | Freq: Four times a day (QID) | RECTAL | Status: DC | PRN

## 2022-12-18 MED ORDER — LORAZEPAM 2 MG/ML IJ SOLN: 1.0000 mg | INTRAMUSCULAR | Status: DC | PRN

## 2022-12-18 MED ORDER — SODIUM CHLORIDE 0.9 % IV SOLN
1.0000 g | INTRAVENOUS | Status: DC
  Administered 2022-12-18 – 2022-12-19 (×2): 1 g via INTRAVENOUS
  Filled 2022-12-18 (×2): qty 10

## 2022-12-18 MED ORDER — INSULIN ASPART 100 UNIT/ML IJ SOLN
0.0000 [IU] | Freq: Three times a day (TID) | INTRAMUSCULAR | Status: DC
  Administered 2022-12-20: 3 [IU] via SUBCUTANEOUS

## 2022-12-18 MED ORDER — LACTATED RINGERS IV SOLN: INTRAVENOUS | Status: DC

## 2022-12-18 MED ORDER — HYDRALAZINE HCL 20 MG/ML IJ SOLN: 5.0000 mg | INTRAMUSCULAR | Status: DC | PRN

## 2022-12-18 MED ORDER — LORAZEPAM 1 MG PO TABS: 1.0000 mg | ORAL_TABLET | ORAL | Status: DC | PRN

## 2022-12-18 MED ORDER — ROSUVASTATIN CALCIUM 20 MG PO TABS
40.0000 mg | ORAL_TABLET | Freq: Every day | ORAL | Status: DC
  Administered 2022-12-19 – 2022-12-20 (×2): 40 mg via ORAL
  Filled 2022-12-18 (×2): qty 2

## 2022-12-18 MED ORDER — AMLODIPINE BESYLATE 10 MG PO TABS
10.0000 mg | ORAL_TABLET | Freq: Every day | ORAL | Status: DC
  Administered 2022-12-19 (×2): 10 mg via ORAL
  Filled 2022-12-18 (×2): qty 1

## 2022-12-18 MED ORDER — ONDANSETRON HCL 4 MG/2ML IJ SOLN
4.0000 mg | Freq: Four times a day (QID) | INTRAMUSCULAR | Status: DC
  Administered 2022-12-18: 4 mg via INTRAVENOUS

## 2022-12-18 NOTE — Consult Note (Signed)
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 female.   HPI:   1 - Gross Hematuria - incidetnal hematuria noted during admission 11/2022 for pancreatitis. No sig anemia or large clots.  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 Some mile left hydro raising suspiciotn of very early T3 disease. No adenopathy.  PMH sig for alcoholism, DM2 (A1c 6s), benign hyst. No ischemic CV disease / blood thinners. Her PCP is with [facility].   Today " [name] " is seen in consultation for above. On / off hematuria for few mos, no retention.   Past Medical History:  Diagnosis Date   Allergy 07/27/2021   seasonal, environmental   Anxiety    At age of 77   Diabetes ([place])    GERD (gastroesophageal reflux disease)    Gestational diabetes 10/02/1991   when pregnant with daughter    Hyperlipidemia    Hypertension 10/01/1997   At age of 45    Past Surgical History:  Procedure Laterality Date   ABDOMINAL HYSTERECTOMY  1999    Family History  Problem Relation Age of Onset   Hypertension Mother    Diabetes Father    Colon polyps Sister    Hypertension Sister    Hypertension Brother    Throat cancer Maternal Uncle    Colon cancer Neg Hx    Esophageal cancer Neg Hx    Rectal cancer Neg Hx    Stomach cancer Neg Hx     Social History:  reports that she quit smoking about 3 years ago. Her smoking use included cigarettes. She has a 38.00 pack-year smoking history. She has been exposed to tobacco smoke. She has never used smokeless tobacco. She reports current alcohol use of about 5.0 standard drinks of alcohol per week. She reports that she does not use drugs.  Allergies: No Known Allergies  Medications: I have reviewed the patient's current medications.  Results for orders placed or performed during  the hospital encounter of 12/17/22 (from the past 48 hour(s))  Lipase, blood     Status: Abnormal   Collection Time: 12/17/22  5:16 PM  Result Value Ref Range   Lipase 283 (H) 11 - 51 U/L    Comment: Performed at [facility], [address] [place], [place] [address]  Comprehensive metabolic panel     Status: Abnormal   Collection Time: 12/17/22  5:16 PM  Result Value Ref Range   Sodium 129 (L) 135 - 145 mmol/L   Potassium 3.0 (L) 3.5 - 5.1 mmol/L   Chloride 95 (L) 98 - 111 mmol/L   CO2 25 22 - 32 mmol/L   Glucose, Bld 189 (H) 70 - 99 mg/dL    Comment: Glucose reference range applies only to samples taken after fasting for at least 8 hours.   BUN 47 (H) 6 - 20 mg/dL   Creatinine, Ser 1.07 (H) 0.44 - 1.00 mg/dL   Calcium 9.7 8.9 - 10.3 mg/dL   Total Protein 8.2 (H) 6.5 - 8.1 g/dL   Albumin 4.6 3.5 - 5.0 g/dL   AST 55 (H) 15 - 41 U/L   ALT 67 (H) 0 - 44 U/L   Alkaline Phosphatase 72 38 - 126 U/L   Total Bilirubin 1.2 0.3 - 1.2 mg/dL   GFR, Estimated >60 >60 mL/min  Comment: (NOTE) Calculated using the CKD-EPI Creatinine Equation (2021)    Anion gap 9 5 - 15    Comment: Performed at [facility], [address] [place], [place] [address]  CBC     Status: Abnormal   Collection Time: 12/17/22  5:16 PM  Result Value Ref Range   WBC 6.4 4.0 - 10.5 K/uL   RBC 4.00 3.87 - 5.11 MIL/uL   Hemoglobin 12.7 12.0 - 15.0 g/dL   HCT 35.6 (L) 36.0 - 46.0 %   MCV 89.0 80.0 - 100.0 fL   MCH 31.8 26.0 - 34.0 pg   MCHC 35.7 30.0 - 36.0 g/dL   RDW 12.4 11.5 - 15.5 %   Platelets 164 150 - 400 K/uL   nRBC 0.0 0.0 - 0.2 %    Comment: Performed at [facility] [address] [place], [place] [address]  Urinalysis, Routine w reflex microscopic -Urine, Clean Catch     Status: Abnormal   Collection Time: 12/17/22  5:16 PM  Result Value Ref Range   Color, Urine RED (A) YELLOW    Comment: BIOCHEMICALS MAY BE AFFECTED BY COLOR   APPearance CLOUDY (A) CLEAR    Specific Gravity, Urine 1.015 1.005 - 1.030   pH 5.5 5.0 - 8.0   Glucose, UA NEGATIVE NEGATIVE mg/dL   Hgb urine dipstick LARGE (A) NEGATIVE   Bilirubin Urine LARGE (A) NEGATIVE   Ketones, ur 15 (A) NEGATIVE mg/dL   Protein, ur >=300 (A) NEGATIVE mg/dL   Nitrite POSITIVE (A) NEGATIVE   Leukocytes,Ua LARGE (A) NEGATIVE    Comment: Performed at [facility], [address] [place], [place] [address]  Urinalysis, Microscopic (reflex)     Status: Abnormal   Collection Time: 12/17/22  5:16 PM  Result Value Ref Range   RBC / HPF >50 0 - 5 RBC/hpf   WBC, UA 6-10 0 - 5 WBC/hpf   Bacteria, UA FEW (A) NONE SEEN   Squamous Epithelial / HPF 6-10 0 - 5 /HPF    Comment: Performed at [facility], [address] [place], [place] [address]    CT ABDOMEN PELVIS W CONTRAST  Result Date: 12/17/2022 CLINICAL DATA:  Pancreatitis, abdominal pain EXAM: CT ABDOMEN AND PELVIS WITH CONTRAST TECHNIQUE: Multidetector CT imaging of the abdomen and pelvis was performed using the standard protocol following bolus administration of intravenous contrast. RADIATION DOSE REDUCTION: This exam was performed according to the departmental dose-optimization program which includes automated exposure control, adjustment of the mA and/or kV according to patient size and/or use of iterative reconstruction technique. CONTRAST:  100 mL OMNIPAQUE IOHEXOL 300 MG/ML  SOLN COMPARISON:  09/15/2019 FINDINGS: Lower chest: No acute abnormality. Hepatobiliary: No focal liver abnormality is seen. No gallstones, gallbladder wall thickening, or biliary dilatation. Pancreas: Peripancreatic fat stranding consistent with acute pancreatitis. No pancreatic parenchymal lesions identified. Spleen: Normal in size without focal abnormality. Adrenals/Urinary Tract: Unremarkable adrenal glands. Subcentimeter cyst left kidney lower pole that may not be further imaged. No hydronephrosis or nephrolithiasis identified. Irregular 4 cm  enhancing lesion in the left side of the urinary bladder concerning for possible neoplastic process. cystoscopic correlation should be considered. Stomach/Bowel: Stomach is within normal limits. Appendix appears normal. No evidence of bowel wall thickening, distention, or inflammatory changes. Vascular/Lymphatic: Aortic atherosclerosis. No enlarged abdominal or pelvic lymph nodes. Reproductive: Status post hysterectomy. No adnexal masses. Other: No abdominal wall hernia or abnormality. No abdominopelvic ascites. Musculoskeletal: No acute  or significant osseous findings. IMPRESSION: 1. Acute pancreatitis. 2. Possible mass in the urinary bladder. Cystoscopic correlation should be considered. Electronically Signed   By: Sammie Bench M.D.   On: 12/17/2022 21:33   US Abdomen Limited RUQ (LIVER/GB)  Result Date: 12/17/2022 CLINICAL DATA:  151471 RUQ pain 151471 EXAM: ULTRASOUND ABDOMEN LIMITED RIGHT UPPER QUADRANT COMPARISON:  None Available. FINDINGS: Gallbladder: No gallstones, pericholecystic fluid or wall thickening visualized. Common bile duct: Diameter: 0.2 cm. Liver: Liver is hyperechoic consistent with fatty infiltration. No focal hepatic lesions identified. No intrahepatic ductal dilatation. Hepatopetal portal vein. IMPRESSION: Hepatic fatty infiltration. Otherwise unremarkable examination of the right upper quadrant. Electronically Signed   By: Sammie Bench M.D.   On: 12/17/2022 20:47    Review of Systems  Constitutional:  Negative for chills and fever.  Gastrointestinal:  Positive for abdominal pain.  Genitourinary:  Positive for hematuria.  All other systems reviewed and are negative.  Blood pressure 138/73, pulse 69, temperature 97.6 F (36.4 C), temperature source Oral, resp. rate 18, height 5\' 2"  (1.575 m), weight 61.2 kg, SpO2 98 %. Physical Exam Vitals reviewed.  Constitutional:      Comments: Pleasant in hospital, resting.   Pulmonary:     Effort: Pulmonary effort is normal.   Abdominal:     General: Abdomen is flat.     Comments: Mild obesity,   Genitourinary:    Comments: No CVAT at present.  Skin:    General: Skin is warm.  Neurological:     Mental Status: She is alert.     Assessment/Plan:  Discussed Likely DX of stage 1-3 (localized) bladder cancer and rec path of outpatient transurethral resection + retrogrades in elective setting in few weeks for diagnostic and theraputic intent. Risks, benefits, alternatives, expected peri-op course discussed.  She will likely continue to have on / off hematuria until resected, fortunatley no sig H/H drop or retention.  No further intervention this hospitalization.  We will request outpatient GU follow up and post for elective surgery (not this hospitalization).   Please call me directly with questions anytime.   Alexis Frock 12/18/2022, 9:42 AM

## 2022-12-18 NOTE — ED Notes (Signed)
Report given to [facility] RN

## 2022-12-18 NOTE — Progress Notes (Addendum)
  Transition of Care [facility]) Screening Note   Patient Details  Name: [name] Date of Birth: 1966-02-25   [facility]) CM/SW Contact:    Tom-Johnson, Renea Ee, RN Phone Number: 12/18/2022, 3:10 PM  Patient is admitted for abdominal/Epigastric pain and CT scan revealed Acute Pancreatitis and possible Mass in the Urinary Bladder. On IV abx. Patient has hx of T2DM, HTN, EtOH abuse, HLD, Alcoholic Hepatitis.   Patient is from home with husband, has a supportive daughter. Self employed. Independent with care prior to admission. Does not drive, husband transports to and from appointments and assists with errands. Does not have DME's at home.  PCP is Gildardo Pounds, NP and uses [facility] in [place].    Transition of Care Department [facility]) has reviewed patient and no TOC needs or recommendations have been identified at this time. TOC will continue to monitor patient advancement through interdisciplinary progression rounds. If new patient transition needs arise, please place a TOC consult.

## 2022-12-18 NOTE — ED Notes (Signed)
Called [facility] for transport talked to Kim at 11:50

## 2022-12-18 NOTE — ED Notes (Signed)
Pt continues to c/o pain , EDP made aware

## 2022-12-18 NOTE — Progress Notes (Signed)
Plan of Care Note for accepted transfer   Patient: [name] MRN: [id number]   DOA: 12/17/2022  Facility requesting transfer: [facility].  Requesting Provider: Audley Hose, MD   Reason for transfer: Acute pancreatitis,?  UTI and suspected bladder mass, AKI, hyponatremia and hypokalemia. Facility course: Sherica Frometa is a 57 y.o. female with T2DM, HTN, EtOH abuse, HLD, alcoholic hepatitis who presents with abdominal pain.    Pt c/o 10/10 constant abdominal pain/epigastric pain that started on Thursday. Did have an episode of nausea/vomiting as well prior to the abdominal pain. Also has noted bright red blood in urine that started on Tuesday, a/w dysuria. No vaginal or rectal bleeding. Denies fevers but endorses chills. Still does feel nauseated. No history of gallstones. No ameliorating/exacerbating factors. Last BM was 6 days ago, hasn't had a bowel movement since then.  When the patient came to the ER vital signs were within normal and later BP was 177/77.  Labs revealed hyponatremia, hypochloremia, hypokalemia, blood Kos 189, BUN 47 creatinine 1.07 and lipase level of 283, AST 55 ALT of 67 and total protein of 8.2.  CBC was unremarkable.  UA showed 6-10 WBCs and few bacteria with more than 300 protein with positive nitrite  Abdominal pelvic CT scan with contrast revealed the following: Acute pancreatitis and possible mass in the urinary bladder.  The patient was given 1 L bolus of IV normal saline, 4 mg of IV Zofran, 4 mg of IV morphine sulfate, 1 mg of IV Dilaudid and another 0.5 mg IV as well as 1 g of IV Rocephin and 10 mill equivalent IV potassium chloride.  Plan of care: The patient is accepted for admission to Telemetry unit, at [facility]..   The patient will be under the care and responsibility of the ER physician until arrival to [facility].  Author: Christel Mormon, MD 12/18/2022  Check [email or web address] for on-call coverage.  Nursing staff, Please call [place] number on Amion as soon as patient's arrival, so appropriate admitting provider can evaluate the pt.

## 2022-12-18 NOTE — H&P (Signed)
History and Physical    Patient: [name] [id number] DOB: 12/20/1965 DOA: 12/17/2022 DOS: the patient was seen and examined on 12/18/2022 PCP: Gildardo Pounds, NP  Patient coming from: Home - lives with husband; NOK: Husband, [phone]; Daughter, [phone]   Chief Complaint: Abdominal pain  HPI: [name] is a 57 y.o. female with medical history significant of HTN, ETOH dependence, and HLD presenting with abdominal pain.  She reports that she felt cold/hot last Monday, 8 days ago.  Early that AM, she started vomiting and it lasted through that day.  Early Tuesday AM, she got up to the bathroom and she had a little diarrhea (none since).  She was eating little through the week.  She couldn't eat Friday and laid in bed al day.  Saturday, she ate a little.  Sunday, she was back in the bed and hasn't eaten since.  She has been sore in her lower back from arthritis and then things progressed.  Thursday, they went camping and slept in the camper.  She is still feeling nauseated.   She had a little bit of blood in her urine last week and then Friday morning it turned red and stayed red.  It is a little more clear now but has some clumps of blood.  She is having pain all over, hard to differentiate.    ER Course:  [facility] to [facility] transfer, per Dr. Sidney Ace:  Pt c/o 10/10 constant abdominal pain/epigastric pain that started on Thursday. One episode of nausea/vomiting. Also with bright red blood in urine that started on Tuesday, a/w dysuria. Denies fevers but endorses chills. Still does feel nauseated. Last BM was 6 days ago.   VSS other than BP 177/77.  Labs revealed hyponatremia, hypochloremia, hypokalemia, blood Kos 189, BUN 47 creatinine 1.07 and lipase level of 283, AST 55 ALT of 67 and total protein of 8.2.  CBC was unremarkable.  UA showed 6-10 WBCs and few bacteria with more than 300 protein with positive nitrite   Abdominal pelvic CT scan with contrast revealed the following: Acute  pancreatitis and possible mass in the urinary bladder.   The patient was given 1 L bolus of IV normal saline, 4 mg of IV Zofran, 4 mg of IV morphine sulfate, 1 mg of IV Dilaudid and another 0.5 mg IV as well as 1 g of IV Rocephin and 10 mill equivalent IV potassium chloride.     Review of Systems: As mentioned in the history of present illness. All other systems reviewed and are negative. Past Medical History:  Diagnosis Date   Allergy 07/27/2021   seasonal, environmental   Anxiety    At age of 25   Diabetes [facility])    GERD (gastroesophageal reflux disease)    Gestational diabetes 10/02/1991   when pregnant with daughter    Hyperlipidemia    Hypertension 10/01/1997   At age of 79   Past Surgical History:  Procedure Laterality Date   ABDOMINAL HYSTERECTOMY  1999   Social History:  reports that she quit smoking about 3 years ago. Her smoking use included cigarettes. She has a 38.00 pack-year smoking history. She has been exposed to tobacco smoke. She has never used smokeless tobacco. She reports current alcohol use of about 5.0 standard drinks of alcohol per week. She reports that she does not use drugs.  No Known Allergies  Family History  Problem Relation Age of Onset   Hypertension Mother    Diabetes Father    Colon polyps Sister  Hypertension Sister    Hypertension Brother    Throat cancer Maternal Uncle    Colon cancer Neg Hx    Esophageal cancer Neg Hx    Rectal cancer Neg Hx    Stomach cancer Neg Hx     Prior to Admission medications   Medication Sig Start Date End Date Taking? Authorizing Provider  amLODipine (NORVASC) 10 MG tablet Take 1 tablet (10 mg total) by mouth at bedtime. To lower blood pressure 11/10/21   Gildardo Pounds, NP  aspirin EC 81 MG tablet Take 81 mg by mouth daily. Swallow whole.    [provider]  Blood Glucose Monitoring Suppl (TRUE METRIX METER) w/Device KIT 1 each by Does not apply route as needed. 01/04/21   Argentina Donovan,  PA-C  carvedilol (COREG) 12.5 MG tablet Take 1 tablet (12.5 mg total) by mouth 2 (two) times daily with a meal. 05/10/22   Newlin, Charlane Ferretti, MD  fluticasone (FLONASE) 50 MCG/ACT nasal spray Place 2 sprays into both nostrils daily. 02/01/22   Melynda Ripple, MD  glucose blood (TRUE METRIX BLOOD GLUCOSE TEST) test strip Use as directed to test blood sugar 3 (three) times daily. 05/23/22   Charlott Rakes, MD  hydrochlorothiazide (HYDRODIURIL) 25 MG tablet Take 1 tablet (25 mg total) by mouth daily. 11/10/21 08/26/22  Gildardo Pounds, NP  hydrOXYzine (VISTARIL) 25 MG capsule Take 1 capsule (25 mg total) by mouth every 8 (eight) hours as needed. 11/10/21   Gildardo Pounds, NP  ibuprofen (ADVIL) 400 MG tablet Take 400 mg by mouth every 6 (six) hours as needed.    [provider]  insulin glargine, 1 Unit Dial, (TOUJEO) 300 UNIT/ML Solostar Pen INJECT 15 UNITS INTO THE SKIN DAILY. 06/20/21 06/20/22  Gildardo Pounds, NP  insulin glargine, 1 Unit Dial, (TOUJEO) 300 UNIT/ML Solostar Pen Inject 15 Units into the skin daily. 11/10/21 09/03/22  Gildardo Pounds, NP  Insulin Pen Needle (B-D ULTRAFINE III SHORT PEN) 31G X 8 MM MISC use as directed daily 06/20/21   Gildardo Pounds, NP  lisinopril (ZESTRIL) 40 MG tablet TAKE 1 TABLET (40 MG TOTAL) BY MOUTH DAILY TO LOWER BLOOD PRESSURE 05/23/22   Charlott Rakes, MD  MELATONIN PO Take 1 tablet by mouth at bedtime as needed (sleep).    [provider]  omeprazole (PRILOSEC) 20 MG capsule Take 1 capsule (20 mg total) by mouth daily. 05/23/22   Charlott Rakes, MD  phenylephrine (SUDAFED PE) 10 MG TABS tablet Take 10 mg by mouth every 4 (four) hours as needed.    [provider]  rosuvastatin (CRESTOR) 20 MG tablet Take 1 tablet (20 mg total) by mouth daily. 11/10/21   Gildardo Pounds, NP  traZODone (DESYREL) 50 MG tablet TAKE 1 TO 2 TABLETS BY MOUTH AT BEDTIME AS NEEDED FOR INSOMNIA 05/23/22   Charlott Rakes, MD  TRUEplus Lancets 28G MISC 1 each by  Does not apply route 3 (three) times daily. 01/04/21   Argentina Donovan, PA-C    Physical Exam: Vitals:   12/18/22 0526 12/18/22 0951 12/18/22 1300 12/18/22 1641  BP: 138/73 (!) 157/80 (!) 146/82 135/78  Pulse: 69 74 72 71  Resp: 18 18  18   Temp: 97.6 F (36.4 C) 98.6 F (37 C)  98.9 F (37.2 C)  TempSrc: Oral Oral  Oral  SpO2: 98% 96%  99%  Weight:      Height:       General:  Appears calm and comfortable  and is in NAD, appear older than stated age Eyes:   EOMI, normal lids, iris ENT:  grossly normal hearing, lips & tongue, mmm; poor dentition Neck:  no LAD, masses or thyromegaly Cardiovascular:  RRR, no m/r/g. No LE edema.  Respiratory:   CTA bilaterally with no wheezes/rales/rhonchi.  Normal respiratory effort. Abdomen:  soft, diffusely TTP Skin:  no rash or induration seen on limited exam Musculoskeletal:  grossly normal tone BUE/BLE, good ROM, no bony abnormality Psychiatric:  blunted mood and affect, speech fluent and appropriate, AOx3 Neurologic:  CN 2-12 grossly intact, moves all extremities in coordinated fashion   Radiological Exams on Admission: Independently reviewed - see discussion in A/P where applicable  CT ABDOMEN PELVIS W CONTRAST  Result Date: 12/17/2022 CLINICAL DATA:  Pancreatitis, abdominal pain EXAM: CT ABDOMEN AND PELVIS WITH CONTRAST TECHNIQUE: Multidetector CT imaging of the abdomen and pelvis was performed using the standard protocol following bolus administration of intravenous contrast. RADIATION DOSE REDUCTION: This exam was performed according to the departmental dose-optimization program which includes automated exposure control, adjustment of the mA and/or kV according to patient size and/or use of iterative reconstruction technique. CONTRAST:  100 mL OMNIPAQUE IOHEXOL 300 MG/ML  SOLN COMPARISON:  09/15/2019 FINDINGS: Lower chest: No acute abnormality. Hepatobiliary: No focal liver abnormality is seen. No gallstones, gallbladder wall thickening, or  biliary dilatation. Pancreas: Peripancreatic fat stranding consistent with acute pancreatitis. No pancreatic parenchymal lesions identified. Spleen: Normal in size without focal abnormality. Adrenals/Urinary Tract: Unremarkable adrenal glands. Subcentimeter cyst left kidney lower pole that may not be further imaged. No hydronephrosis or nephrolithiasis identified. Irregular 4 cm enhancing lesion in the left side of the urinary bladder concerning for possible neoplastic process. cystoscopic correlation should be considered. Stomach/Bowel: Stomach is within normal limits. Appendix appears normal. No evidence of bowel wall thickening, distention, or inflammatory changes. Vascular/Lymphatic: Aortic atherosclerosis. No enlarged abdominal or pelvic lymph nodes. Reproductive: Status post hysterectomy. No adnexal masses. Other: No abdominal wall hernia or abnormality. No abdominopelvic ascites. Musculoskeletal: No acute or significant osseous findings. IMPRESSION: 1. Acute pancreatitis. 2. Possible mass in the urinary bladder. Cystoscopic correlation should be considered. Electronically Signed   By: Sammie Bench M.D.   On: 12/17/2022 21:33   US Abdomen Limited RUQ (LIVER/GB)  Result Date: 12/17/2022 CLINICAL DATA:  151471 RUQ pain 151471 EXAM: ULTRASOUND ABDOMEN LIMITED RIGHT UPPER QUADRANT COMPARISON:  None Available. FINDINGS: Gallbladder: No gallstones, pericholecystic fluid or wall thickening visualized. Common bile duct: Diameter: 0.2 cm. Liver: Liver is hyperechoic consistent with fatty infiltration. No focal hepatic lesions identified. No intrahepatic ductal dilatation. Hepatopetal portal vein. IMPRESSION: Hepatic fatty infiltration. Otherwise unremarkable examination of the right upper quadrant. Electronically Signed   By: Sammie Bench M.D.   On: 12/17/2022 20:47    EKG: none   Labs on Admission: I have personally reviewed the available labs and imaging studies at the time of the  admission.  Pertinent labs:    Na++ 129 K+ 3.0 Glucose 189 BUN 47/Creatinine 1.07/GFR >60; 19/0.84 on 1/22 AP 283 AST 55/ALT 67 Lipase 283 Unremarkable CBC UA: large bili, large Hgb, 15 ketones, large LE, + nitrite, >300 protein, few bacteria Urine culture pending   Assessment and Plan: Principal Problem:   Acute pancreatitis Active Problems:   ETOH abuse   Mixed hyperlipidemia   Controlled type 2 diabetes mellitus without complication, with long-term current use of insulin (HCC)   Essential hypertension   Bladder mass   Anxiety    Assessment and Plan:  Acute  alcoholic pancreatitis -Patient without prior h/o pancreatitis presenting with marked abdominal pain, minimal vomiting, poor PO intake -Now with frank pancreatitis by H&P, mildly elevated lipase, and + CT -Will admit to med surg -Strict NPO for now -Aggressive IVF hydration at least for the first 12 hours with LR at 200 cc/hr -Pain control with morphine 2 mg q2h prn.   -Nausea control with Zofran -The 4 most likely causes for pancreatitis include:             -Gallstones - not seen on RUQ [place]             -Alcohol - most likely etiology.  Patient has long-standing ETOH dependence and recognizes the importance of quitting at this time.             -Medications - She does not appear to be taking medications that place her at increased risk.                         -Hypertriglyceridemia - will check a fasting lipid profile in the AM.  Bladder mass -Patient reported gross hematuria -Imaging reviewed with Dr. Tresa Moore and he strongly believes that this is bladder cancer - nothing to do in the hospitalization but definitely needs follow up.   -Hematuria will come and go until it is removed.   -Dr. Tresa Moore has consulted and will plan to follow up as outpatient for surgical repair -He does not recommend antibiotics at this time -Hold ASA  ETOH dependence -Patient with chronic ETOH dependence -Number of drinks per day: 2-5  glasses wine/day -Number of admissions for management of alcohol withdrawal syndrome (detox): multiple -CIWA protocol -Folate, thiamine, and MVI ordered -Will provide symptom-triggered BZD (ativan per CIWA protocol) only since the patient is able to communicate; is not showing current signs of delirium; and has no history of severe withdrawal. -TOC team consult for possible inpatient treatment -Elevated LFTs are likely related to alcoholism -Consider offering a medication for Alcohol Use Disorder at the time of d/c, to include Disulfuram; Naltrexone; or Acamprosate.  HLD -Continue rosuvastatin  HTN -Continue amlodipine, carvedilol -Hold HCTZ, Lisinopril due to mildly worsened renal function compared to baseline (does not meet AKI criteria)  Anxiety -Continue hydroxyzine, trazodone  DM -Will check A1c -Continue glargine -Cover with moderate-scale SSI  -Continue gabapentin   Advance Care Planning:   Code Status: Full Code - Code status was discussed with the patient and/or family at the time of admission.  The patient would want to receive full resuscitative measures at this time.   Consults: Urology, [facility] team  DVT Prophylaxis: SCDs  Family Communication: None present; she declined to have me call her husband at the time of admission  Severity of Illness: The appropriate patient status for this patient is INPATIENT. Inpatient status is judged to be reasonable and necessary in order to provide the required intensity of service to ensure the patient's safety. The patient's presenting symptoms, physical exam findings, and initial radiographic and laboratory data in the context of their chronic comorbidities is felt to place them at high risk for further clinical deterioration. Furthermore, it is not anticipated that the patient will be medically stable for discharge from the hospital within 2 midnights of admission.   * I certify that at the point of admission it is my clinical  judgment that the patient will require inpatient hospital care spanning beyond 2 midnights from the point of admission due to high intensity of service,  high risk for further deterioration and high frequency of surveillance required.*  Author: Karmen Bongo, MD 12/18/2022 6:36 PM  For on call review [email or web address].

## 2022-12-18 NOTE — ED Notes (Signed)
Placed pt on 2L Oxygen, pt has sleep apnea and desats after pain meds given when asleep

## 2022-12-18 NOTE — Progress Notes (Signed)
Patient has arrived to unit from another hospital. [place] on call notified. Patient is lying in bed in no distress.

## 2022-12-18 NOTE — ED Notes (Signed)
Report given to [place].   

## 2022-12-19 DIAGNOSIS — K852 Alcohol induced acute pancreatitis without necrosis or infection: Secondary | ICD-10-CM | POA: Diagnosis not present

## 2022-12-19 DIAGNOSIS — N179 Acute kidney failure, unspecified: Secondary | ICD-10-CM | POA: Diagnosis not present

## 2022-12-19 DIAGNOSIS — N3001 Acute cystitis with hematuria: Secondary | ICD-10-CM | POA: Diagnosis not present

## 2022-12-19 LAB — CBC
HCT: 31 % — ABNORMAL LOW (ref 36.0–46.0)
Hemoglobin: 10.7 g/dL — ABNORMAL LOW (ref 12.0–15.0)
MCH: 31.7 pg (ref 26.0–34.0)
MCHC: 34.5 g/dL (ref 30.0–36.0)
MCV: 91.7 fL (ref 80.0–100.0)
Platelets: 174 10*3/uL (ref 150–400)
RBC: 3.38 MIL/uL — ABNORMAL LOW (ref 3.87–5.11)
RDW: 12.5 % (ref 11.5–15.5)
WBC: 6.7 10*3/uL (ref 4.0–10.5)
nRBC: 0 % (ref 0.0–0.2)

## 2022-12-19 LAB — COMPREHENSIVE METABOLIC PANEL
ALT: 47 U/L — ABNORMAL HIGH (ref 0–44)
AST: 32 U/L (ref 15–41)
Albumin: 3.5 g/dL (ref 3.5–5.0)
Alkaline Phosphatase: 54 U/L (ref 38–126)
Anion gap: 16 — ABNORMAL HIGH (ref 5–15)
BUN: 12 mg/dL (ref 6–20)
CO2: 23 mmol/L (ref 22–32)
Calcium: 8.9 mg/dL (ref 8.9–10.3)
Chloride: 95 mmol/L — ABNORMAL LOW (ref 98–111)
Creatinine, Ser: 0.6 mg/dL (ref 0.44–1.00)
GFR, Estimated: >60 mL/min (ref >60)
Glucose, Bld: 84 mg/dL (ref 70–99)
Potassium: 3.3 mmol/L — ABNORMAL LOW (ref 3.5–5.1)
Sodium: 134 mmol/L — ABNORMAL LOW (ref 135–145)
Total Bilirubin: 1 mg/dL (ref 0.3–1.2)
Total Protein: 6.1 g/dL — ABNORMAL LOW (ref 6.5–8.1)

## 2022-12-19 LAB — LIPID PANEL
Cholesterol: 151 mg/dL (ref 0–200)
HDL: 40 mg/dL — ABNORMAL LOW (ref >40)
LDL Cholesterol: 97 mg/dL (ref 0–99)
Total CHOL/HDL Ratio: 3.8 RATIO
Triglycerides: 68 mg/dL (ref <150)
VLDL: 14 mg/dL (ref 0–40)

## 2022-12-19 LAB — GLUCOSE, CAPILLARY
Glucose-Capillary: 81 mg/dL (ref 70–99)
Glucose-Capillary: 89 mg/dL (ref 70–99)
Glucose-Capillary: 88 mg/dL (ref 70–99)
Glucose-Capillary: 119 mg/dL — ABNORMAL HIGH (ref 70–99)

## 2022-12-19 NOTE — Hospital Course (Signed)
57 y.o. female with medical history significant of HTN, ETOH dependence, and HLD presenting with abdominal pain. Pt admitted for pancreatitis. Pt incidentally was found to have evidence of bladder cancer, Urology was consulted

## 2022-12-19 NOTE — Progress Notes (Signed)
  Progress Note   Patient: [name] [id number] DOB: 1966/07/09 DOA: 12/17/2022     1 DOS: the patient was seen and examined on 12/19/2022   Brief hospital course: 57 y.o. female with medical history significant of HTN, ETOH dependence, and HLD presenting with abdominal pain. Pt admitted for pancreatitis. Pt incidentally was found to have evidence of bladder cancer, Urology was consulted  Assessment and Plan: Acute alcoholic pancreatitis -Patient remote h/o pancreatitis presenting with marked abdominal pain, minimal vomiting, poor PO intake -Noted to have evidence of pancreatitis on CT -pt continued with IVF hydration -This AM reports pain improving, eager to advance diet. Place on clears   Bladder mass -Patient reported gross hematuria -Dr. Tresa Moore consulted at presentation with imaging reviewed. Concern for bladder cancer with recs to f/u closely as outpatient -Hold ASA   ETOH dependence -Patient with chronic ETOH dependence -Number of drinks per day: 2-5 glasses wine/day -Number of admissions for management of alcohol withdrawal syndrome (detox): multiple -Continue CIWA protocol   HLD -Continue rosuvastatin   HTN -Continue amlodipine, carvedilol -Hold HCTZ, Lisinopril due to mildly worsened renal function compared to baseline (does not meet AKI criteria) -BP remains stable   Anxiety -Continue hydroxyzine, trazodone   DM -A1c 6.2 -Continue glargine -Cover with moderate-scale SSI  -Continue gabapentin   Subjective: Reports abd pain is improved, eager to begin advancing deit  Physical Exam: Vitals:   12/18/22 1641 12/18/22 2117 12/19/22 0514 12/19/22 0906  BP: 135/78 (!) 141/67 (!) 136/56 128/65  Pulse: 71 68 64 68  Resp: 18 18 18 16   Temp: 98.9 F (37.2 C) 99.3 F (37.4 C) 97.8 F (36.6 C) 98.2 F (36.8 C)  TempSrc: Oral Oral Oral Oral  SpO2: 99% 98% 98% 98%  Weight:      Height:       General exam: Awake, laying in bed, in nad Respiratory system:  Normal respiratory effort, no wheezing Cardiovascular system: regular rate, s1, s2 Gastrointestinal system: Soft, nondistended, positive BS Central nervous system: CN2-12 grossly intact, strength intact Extremities: Perfused, no clubbing Skin: Normal skin turgor, no notable skin lesions seen Psychiatry: Mood normal // no visual hallucinations   Data Reviewed:  Labs reviewed: na 134, K 3.3, Cr 0.6, WBC 6.7, Hgb 10.7  Family Communication: Pt in room, family not at bedside  Disposition: Status is: Inpatient Remains inpatient appropriate because: Severity of illness  Planned Discharge Destination: Home     Author: Marylu Lund, MD 12/19/2022 2:33 PM  For on call review [email or web address].

## 2022-12-20 ENCOUNTER — Other Ambulatory Visit: Payer: Self-pay | Admitting: Nurse Practitioner

## 2022-12-20 DIAGNOSIS — N3001 Acute cystitis with hematuria: Secondary | ICD-10-CM | POA: Diagnosis not present

## 2022-12-20 DIAGNOSIS — R310 Gross hematuria: Secondary | ICD-10-CM

## 2022-12-20 DIAGNOSIS — N179 Acute kidney failure, unspecified: Secondary | ICD-10-CM | POA: Diagnosis not present

## 2022-12-20 DIAGNOSIS — K852 Alcohol induced acute pancreatitis without necrosis or infection: Secondary | ICD-10-CM | POA: Diagnosis not present

## 2022-12-20 LAB — COMPREHENSIVE METABOLIC PANEL
ALT: 40 U/L (ref 0–44)
AST: 29 U/L (ref 15–41)
Albumin: 3.3 g/dL — ABNORMAL LOW (ref 3.5–5.0)
Alkaline Phosphatase: 60 U/L (ref 38–126)
Anion gap: 7 (ref 5–15)
BUN: 6 mg/dL (ref 6–20)
CO2: 28 mmol/L (ref 22–32)
Calcium: 8.7 mg/dL — ABNORMAL LOW (ref 8.9–10.3)
Chloride: 100 mmol/L (ref 98–111)
Creatinine, Ser: 0.58 mg/dL (ref 0.44–1.00)
GFR, Estimated: >60 mL/min (ref >60)
Glucose, Bld: 94 mg/dL (ref 70–99)
Potassium: 2.9 mmol/L — ABNORMAL LOW (ref 3.5–5.1)
Sodium: 135 mmol/L (ref 135–145)
Total Bilirubin: 0.7 mg/dL (ref 0.3–1.2)
Total Protein: 5.9 g/dL — ABNORMAL LOW (ref 6.5–8.1)

## 2022-12-20 LAB — URINE CULTURE: Culture: 20000 — AB

## 2022-12-20 LAB — CBC
HCT: 31.1 % — ABNORMAL LOW (ref 36.0–46.0)
Hemoglobin: 10.8 g/dL — ABNORMAL LOW (ref 12.0–15.0)
MCH: 31.7 pg (ref 26.0–34.0)
MCHC: 34.7 g/dL (ref 30.0–36.0)
MCV: 91.2 fL (ref 80.0–100.0)
Platelets: 185 10*3/uL (ref 150–400)
RBC: 3.41 MIL/uL — ABNORMAL LOW (ref 3.87–5.11)
RDW: 12.4 % (ref 11.5–15.5)
WBC: 5.6 10*3/uL (ref 4.0–10.5)
nRBC: 0 % (ref 0.0–0.2)

## 2022-12-20 LAB — MAGNESIUM: Magnesium: 1.4 mg/dL — ABNORMAL LOW (ref 1.7–2.4)

## 2022-12-20 LAB — GLUCOSE, CAPILLARY
Glucose-Capillary: 108 mg/dL — ABNORMAL HIGH (ref 70–99)
Glucose-Capillary: 153 mg/dL — ABNORMAL HIGH (ref 70–99)

## 2022-12-20 LAB — LIPASE, BLOOD: Lipase: 142 U/L — ABNORMAL HIGH (ref 11–51)

## 2022-12-20 MED ORDER — MAGNESIUM SULFATE 4 GM/100ML IV SOLN
4.0000 g | Freq: Once | INTRAVENOUS | Status: AC
  Administered 2022-12-20: 4 g via INTRAVENOUS
  Filled 2022-12-20: qty 100

## 2022-12-20 MED ORDER — ROSUVASTATIN CALCIUM 40 MG PO TABS: 40.0000 mg | ORAL_TABLET | Freq: Every day | ORAL | 0 refills | Status: AC

## 2022-12-20 MED ORDER — POTASSIUM CHLORIDE CRYS ER 20 MEQ PO TBCR
60.0000 meq | EXTENDED_RELEASE_TABLET | ORAL | Status: AC
  Administered 2022-12-20 (×2): 60 meq via ORAL
  Filled 2022-12-20 (×2): qty 3

## 2022-12-20 NOTE — Discharge Summary (Signed)
Physician Discharge Summary   Patient: [name] MRN: [id number] DOB: [birth year]  Admit date:     12/17/2022  Discharge date: 12/20/22  Discharge Physician: Marylu Lund   PCP: Gildardo Pounds, NP   Recommendations at discharge:    Follow up with PCP in 1-2 weeks Follow up with Urology as scheduled  Discharge Diagnoses: Principal Problem:   Acute pancreatitis Active Problems:   ETOH abuse   Mixed hyperlipidemia   Controlled type 2 diabetes mellitus without complication, with long-term current use of insulin (Levy)   Essential hypertension   Bladder mass   Anxiety  Resolved Problems:   * No resolved hospital problems. *  Hospital Course: 57 y.o. female with medical history significant of HTN, ETOH dependence, and HLD presenting with abdominal pain. Pt admitted for pancreatitis. Pt incidentally was found to have evidence of bladder cancer, Urology was consulted  Assessment and Plan: Acute alcoholic pancreatitis -Patient remote h/o pancreatitis presenting with marked abdominal pain, minimal vomiting, poor PO intake -Noted to have evidence of pancreatitis on CT -improved with IVF hydration -This AM reports pain improved, advanced to solid diet   Bladder mass -Patient reported gross hematuria -Dr. Tresa Moore consulted at presentation with imaging reviewed. Concern for bladder cancer with recs to f/u closely as outpatient -Holding ASA given concerns of hematuria   ETOH dependence -Patient with chronic ETOH dependence -Number of drinks per day: 2-5 glasses wine/day -Number of admissions for management of alcohol withdrawal syndrome (detox) -Pt declined ETOH program resources at time of d/c   HLD -Continue rosuvastatin   HTN -Continue amlodipine, carvedilol -Hold HCTZ, Lisinopril due to mildly worsened renal function compared to baseline (does not meet AKI criteria) -BP remained stable   Anxiety -Continue hydroxyzine, trazodone   DM -A1c 6.2 -Continued  glargine -Continued gabapentin -cont home meds on d/c     Consultants: Urology Procedures performed:   Disposition: Home Diet recommendation:  Carb modified diet DISCHARGE MEDICATION: Allergies as of 12/20/2022   No Known Allergies      Medication List     STOP taking these medications    aspirin EC 81 MG tablet   hydrochlorothiazide 25 MG tablet Commonly known as: HYDRODIURIL   ibuprofen 400 MG tablet Commonly known as: ADVIL       TAKE these medications    amLODipine 10 MG tablet Commonly known as: NORVASC Take 1 tablet (10 mg total) by mouth at bedtime. To lower blood pressure   B-D ULTRAFINE III SHORT PEN 31G X 8 MM Misc Generic drug: Insulin Pen Needle use as directed daily   carvedilol 12.5 MG tablet Commonly known as: COREG Take 1 tablet (12.5 mg total) by mouth 2 (two) times daily with a meal.   fluticasone 50 MCG/ACT nasal spray Commonly known as: FLONASE Place 2 sprays into both nostrils daily.   gabapentin 300 MG capsule Commonly known as: NEURONTIN Take 300 mg by mouth in the morning and at bedtime.   hydrOXYzine 25 MG capsule Commonly known as: VISTARIL Take 1 capsule (25 mg total) by mouth every 8 (eight) hours as needed.   lisinopril 40 MG tablet Commonly known as: ZESTRIL TAKE 1 TABLET (40 MG TOTAL) BY MOUTH DAILY TO LOWER BLOOD PRESSURE   omeprazole 20 MG capsule Commonly known as: PRILOSEC Take 1 capsule (20 mg total) by mouth daily.   phenylephrine 10 MG Tabs tablet Commonly known as: SUDAFED PE Take 10 mg by mouth every 4 (four) hours as needed.   rosuvastatin 40 MG tablet  Commonly known as: CRESTOR Take by mouth. What changed: Another medication with the same name was removed. Continue taking this medication, and follow the directions you see here.   Toujeo SoloStar 300 UNIT/ML Solostar Pen Generic drug: insulin glargine (1 Unit Dial) Inject 15 Units into the skin daily.   traZODone 50 MG tablet Commonly known as:  DESYREL TAKE 1 TO 2 TABLETS BY MOUTH AT BEDTIME AS NEEDED FOR INSOMNIA   True Metrix Blood Glucose Test test strip Generic drug: glucose blood Use as directed to test blood sugar 3 (three) times daily.   True Metrix Meter w/Device Kit 1 each by Does not apply route as needed.   TRUEplus Lancets 28G Misc 1 each by Does not apply route 3 (three) times daily.        Follow-up Information     [facility] Follow up.   Why: Please call office to arrange followup for likely bladder cancer. Contact information: [place]        Gildardo Pounds, NP Follow up in 1 week(s).   Specialty: Nurse Practitioner Why: Hospital follow up Contact information: [place] [phone]                Discharge Exam: Danley Danker Weights   12/17/22 1712 12/18/22 0518  Weight: 64.9 kg 61.2 kg   General exam: Awake, laying in bed, in nad Respiratory system: Normal respiratory effort, no wheezing Cardiovascular system: regular rate, s1, s2 Gastrointestinal system: Soft, nondistended, positive BS Central nervous system: CN2-12 grossly intact, strength intact Extremities: Perfused, no clubbing Skin: Normal skin turgor, no notable skin lesions seen Psychiatry: Mood normal // no visual hallucinations   Condition at discharge: fair  The results of significant diagnostics from this hospitalization (including imaging, microbiology, ancillary and laboratory) are listed below for reference.   Imaging Studies: CT ABDOMEN PELVIS W CONTRAST  Result Date: 12/17/2022 CLINICAL DATA:  Pancreatitis, abdominal pain EXAM: CT ABDOMEN AND PELVIS WITH CONTRAST TECHNIQUE: Multidetector CT imaging of the abdomen and pelvis was performed using the standard protocol following bolus administration of intravenous contrast. RADIATION DOSE REDUCTION: This exam was performed according to the departmental  dose-optimization program which includes automated exposure control, adjustment of the mA and/or kV according to patient size and/or use of iterative reconstruction technique. CONTRAST:  100 mL OMNIPAQUE IOHEXOL 300 MG/ML  SOLN COMPARISON:  09/15/2019 FINDINGS: Lower chest: No acute abnormality. Hepatobiliary: No focal liver abnormality is seen. No gallstones, gallbladder wall thickening, or biliary dilatation. Pancreas: Peripancreatic fat stranding consistent with acute pancreatitis. No pancreatic parenchymal lesions identified. Spleen: Normal in size without focal abnormality. Adrenals/Urinary Tract: Unremarkable adrenal glands. Subcentimeter cyst left kidney lower pole that may not be further imaged. No hydronephrosis or nephrolithiasis identified. Irregular 4 cm enhancing lesion in the left side of the urinary bladder concerning for possible neoplastic process. cystoscopic correlation should be considered. Stomach/Bowel: Stomach is within normal limits. Appendix appears normal. No evidence of bowel wall thickening, distention, or inflammatory changes. Vascular/Lymphatic: Aortic atherosclerosis. No enlarged abdominal or pelvic lymph nodes. Reproductive: Status post hysterectomy. No adnexal masses. Other: No abdominal wall hernia or abnormality. No abdominopelvic ascites. Musculoskeletal: No acute or significant osseous findings. IMPRESSION: 1. Acute pancreatitis. 2. Possible mass in the urinary bladder. Cystoscopic correlation should be considered. Electronically Signed   By: Sammie Bench M.D.   On: 12/17/2022 21:33   US Abdomen Limited RUQ (LIVER/GB)  Result Date: 12/17/2022 CLINICAL  DATA:  [id number] RUQ pain [id number] EXAM: ULTRASOUND ABDOMEN LIMITED RIGHT UPPER QUADRANT COMPARISON:  None Available. FINDINGS: Gallbladder: No gallstones, pericholecystic fluid or wall thickening visualized. Common bile duct: Diameter: 0.2 cm. Liver: Liver is hyperechoic consistent with fatty infiltration. No focal hepatic  lesions identified. No intrahepatic ductal dilatation. Hepatopetal portal vein. IMPRESSION: Hepatic fatty infiltration. Otherwise unremarkable examination of the right upper quadrant. Electronically Signed   By: Sammie Bench M.D.   On: 12/17/2022 20:47    Microbiology: Results for orders placed or performed during the hospital encounter of 12/17/22  Urine Culture (for pregnant, neutropenic or urologic patients or patients with an indwelling urinary catheter)     Status: Abnormal   Collection Time: 12/17/22  9:53 PM   Specimen: Urine, Clean Catch  Result Value Ref Range Status   Specimen Description   Final    URINE, CLEAN CATCH Performed at [facility], [address] [place], [place] [address]    Special Requests   Final    NONE Performed at [facility], [address] [place], [place] [address]    Culture 20,000 COLONIES/mL ESCHERICHIA COLI (A)  Final   Report Status 12/20/2022 FINAL  Final   Organism ID, Bacteria ESCHERICHIA COLI (A)  Final      Susceptibility   Escherichia coli - MIC*    AMPICILLIN >=32 RESISTANT Resistant     CEFAZOLIN 8 SENSITIVE Sensitive     CEFEPIME <=0.12 SENSITIVE Sensitive     CIPROFLOXACIN <=0.25 SENSITIVE Sensitive     GENTAMICIN <=1 SENSITIVE Sensitive     IMIPENEM <=0.25 SENSITIVE Sensitive     NITROFURANTOIN <=16 SENSITIVE Sensitive     TRIMETH/SULFA <=20 SENSITIVE Sensitive     AMPICILLIN/SULBACTAM 16 INTERMEDIATE Intermediate     PIP/TAZO <=4 SENSITIVE Sensitive     * 20,000 COLONIES/mL ESCHERICHIA COLI    Labs: CBC: Recent Labs  Lab 12/17/22 1716 12/19/22 0142 12/20/22 0100  WBC 6.4 6.7 5.6  HGB 12.7 10.7* 10.8*  HCT 35.6* 31.0* 31.1*  MCV 89.0 91.7 91.2  PLT 164 174 [id number]   Basic Metabolic Panel: Recent Labs  Lab 12/17/22 1716 12/19/22 0142 12/20/22 0100  NA 129* 134* 135  K 3.0* 3.3* 2.9*  CL 95* 95* 100  CO2 25 23 28   GLUCOSE 189* 84 94  BUN 47* 12 6  CREATININE 1.07* 0.60 0.58  CALCIUM 9.7  8.9 8.7*  MG  --   --  1.4*   Liver Function Tests: Recent Labs  Lab 12/17/22 1716 12/19/22 0142 12/20/22 0100  AST 55* 32 29  ALT 67* 47* 40  ALKPHOS 72 54 60  BILITOT 1.2 1.0 0.7  PROT 8.2* 6.1* 5.9*  ALBUMIN 4.6 3.5 3.3*   CBG: Recent Labs  Lab 12/19/22 1132 12/19/22 1648 12/19/22 2111 12/20/22 0720 12/20/22 1126  GLUCAP 89 88 119* 108* 153*    Discharge time spent: less than 30 minutes.  Signed: Marylu Lund, MD [facility] 12/20/2022

## 2022-12-20 NOTE — Discharge Instructions (Signed)
Intensive Outpatient Programs   [facility]                                  [facility] [address]                                                       [address] [place],  [place]                                                           [place],  [phone]                                                              [phone]   [facility]                [facility]         (Inpatient and outpatient)                                           [phone] (Suboxone and Methadone) [address]                                                                                                                 [phone]                                                                                                     [facility]                                               [facility] - Intensive Outpatient [address]                                                            [address] [place], [place] [address]                                                 [place], [place]  [phone]                                                              [phone]   [facility] (Outpatient, Inpatient, Chemical       [facility] (Groups and [facility]idental) (insurance only) [phone]                                              [place], [place]                                                                                                             [phone]                                                    [facility]                                       [facility] (for caregivers and family) [address]                                                    [address] [place], [place]                                                          [place], [place] [phone]                                                               [phone]   Residential Treatment Programs   [facility]            [facility](2 years) Residential: 90 days)                 [facility] [address]                                                            [address] [place], [place]                                                    [place], [name] [phone]                                                              [phone] or [phone]   [facility]                                              [facility] [address]                                                               [address] [place], [place]                                                          , [place] [phone]                                                              [phone]   [facility]                                [facility] ([facility] [address]                                                           [address] [place], [place] [address]                                                   [place], [place] [phone]                                                              [phone] Admissions: 8am-3pm M-F   [facility]: Residential Program (90 Days)                     [facility]: [facility]  [place], [place]                                                    [place], [place]  [phone] or [phone]                                             (Walk in Hours over the weekend or by referral)     [facility]: [facility]:[phone] (for crisis response 24 hours a day)     Last Modified by Smith, Jessica, LCSWA at 06/17/21 1447    

## 2022-12-20 NOTE — TOC Transition Note (Signed)
Transition of Care [facility]) - CM/SW Discharge Note   Patient Details  Name: [name] MRN: [id number] Date of Birth: 17-Apr-1966  [facility]) CM/SW Contact:  Verdell Carmine, RN Phone Number: 12/20/2022, 1:51 PM   Clinical Narrative:    SA resources placed on AVS for patient. NO other needs identified. Will be DC today         Patient Goals and CMS Choice      Discharge Placement                         Discharge Plan and Services Additional resources added to the After Visit Summary for                                       Social Determinants of Health (SDOH) Interventions SDOH Screenings   Food Insecurity: No Food Insecurity (12/18/2022)  Housing: Low Risk  (12/18/2022)  Transportation Needs: No Transportation Needs (12/18/2022)  Utilities: Not At Risk (12/18/2022)  Depression (PHQ2-9): Medium Risk (08/04/2021)  Tobacco Use: Medium Risk (12/18/2022)     Readmission Risk Interventions     No data to display

## 2022-12-24 ENCOUNTER — Telehealth: Payer: Self-pay

## 2022-12-24 NOTE — Transitions of Care (Post Inpatient/ED Visit) (Signed)
   12/24/2022  Name: [name] MRN: [id number] DOB: 1966-04-21  Today's TOC FU Call Status: Today's TOC FU Call Status:: Unsuccessul Call (1st Attempt) Unsuccessful Call (1st Attempt) Date: 12/24/22  Attempted to reach the patient regarding the most recent Inpatient/ED visit.  Follow Up Plan: Additional outreach attempts will be made to reach the patient to complete the Transitions of Care (Post Inpatient/ED visit) call.   Signature  Eden Lathe, RN

## 2022-12-25 ENCOUNTER — Telehealth: Payer: Self-pay

## 2022-12-25 NOTE — Transitions of Care (Post Inpatient/ED Visit) (Signed)
   12/25/2022  Name: [name] MRN: [id number] DOB: May 21, 1966  Today's TOC FU Call Status: Today's TOC FU Call Status:: Unsuccessful Call (2nd Attempt) Unsuccessful Call (1st Attempt) Date: 12/24/22 Unsuccessful Call (2nd Attempt) Date: 12/25/22  Attempted to reach the patient regarding the most recent Inpatient/ED visit.  Follow Up Plan: Additional outreach attempts will be made to reach the patient to complete the Transitions of Care (Post Inpatient/ED visit) call.   Signature  Eden Lathe, RN

## 2022-12-26 ENCOUNTER — Telehealth: Payer: Self-pay

## 2022-12-26 NOTE — Transitions of Care (Post Inpatient/ED Visit) (Signed)
   12/26/2022  Name: [name] MRN: [id number] DOB: 01-Mar-1966  Today's TOC FU Call Status: Today's TOC FU Call Status:: Unsuccessful Call (3rd Attempt) Unsuccessful Call (1st Attempt) Date: 12/24/22 Unsuccessful Call (2nd Attempt) Date: 12/25/22 Unsuccessful Call (3rd Attempt) Date: 12/26/22  Attempted to reach the patient regarding the most recent Inpatient/ED visit.  Follow Up Plan: No further outreach attempts will be made at this time. We have been unable to contact the patient.  It is  noted in Epic that the patient has an appointment in 01/02/2023 with an  [place] practice.   Signature Eden Lathe, RN

## 2023-01-24 ENCOUNTER — Encounter (HOSPITAL_BASED_OUTPATIENT_CLINIC_OR_DEPARTMENT_OTHER): Payer: Self-pay | Admitting: Pediatrics

## 2023-01-24 ENCOUNTER — Emergency Department (HOSPITAL_BASED_OUTPATIENT_CLINIC_OR_DEPARTMENT_OTHER): Payer: BLUE CROSS/BLUE SHIELD

## 2023-01-24 ENCOUNTER — Emergency Department (HOSPITAL_BASED_OUTPATIENT_CLINIC_OR_DEPARTMENT_OTHER)
Admission: EM | Admit: 2023-01-24 | Discharge: 2023-01-24 | Disposition: A | Discharge status: Home / Self Care | Payer: BLUE CROSS/BLUE SHIELD | Attending: Emergency Medicine | Admitting: Emergency Medicine

## 2023-01-24 ENCOUNTER — Other Ambulatory Visit: Payer: Self-pay

## 2023-01-24 DIAGNOSIS — I10 Essential (primary) hypertension: Secondary | ICD-10-CM | POA: Insufficient documentation

## 2023-01-24 DIAGNOSIS — E119 Type 2 diabetes mellitus without complications: Secondary | ICD-10-CM | POA: Diagnosis not present

## 2023-01-24 DIAGNOSIS — G8918 Other acute postprocedural pain: Secondary | ICD-10-CM | POA: Insufficient documentation

## 2023-01-24 DIAGNOSIS — R1030 Lower abdominal pain, unspecified: Secondary | ICD-10-CM | POA: Diagnosis present

## 2023-01-24 DIAGNOSIS — Z8551 Personal history of malignant neoplasm of bladder: Secondary | ICD-10-CM | POA: Diagnosis not present

## 2023-01-24 DIAGNOSIS — K5900 Constipation, unspecified: Secondary | ICD-10-CM | POA: Insufficient documentation

## 2023-01-24 LAB — URINALYSIS, ROUTINE W REFLEX MICROSCOPIC
Glucose, UA: NEGATIVE mg/dL
Ketones, ur: NEGATIVE mg/dL
Nitrite: NEGATIVE
Protein, ur: >=300 mg/dL — AB
Specific Gravity, Urine: 1.015 (ref 1.005–1.030)
pH: 6 (ref 5.0–8.0)

## 2023-01-24 LAB — COMPREHENSIVE METABOLIC PANEL
ALT: 38 U/L (ref 0–44)
AST: 39 U/L (ref 15–41)
Albumin: 4.1 g/dL (ref 3.5–5.0)
Alkaline Phosphatase: 53 U/L (ref 38–126)
Anion gap: 10 (ref 5–15)
BUN: 18 mg/dL (ref 6–20)
CO2: 20 mmol/L — ABNORMAL LOW (ref 22–32)
Calcium: 8.6 mg/dL — ABNORMAL LOW (ref 8.9–10.3)
Chloride: 100 mmol/L (ref 98–111)
Creatinine, Ser: 0.59 mg/dL (ref 0.44–1.00)
GFR, Estimated: >60 mL/min (ref >60)
Glucose, Bld: 129 mg/dL — ABNORMAL HIGH (ref 70–99)
Potassium: 3.3 mmol/L — ABNORMAL LOW (ref 3.5–5.1)
Sodium: 130 mmol/L — ABNORMAL LOW (ref 135–145)
Total Bilirubin: 0.8 mg/dL (ref 0.3–1.2)
Total Protein: 7 g/dL (ref 6.5–8.1)

## 2023-01-24 LAB — CBC WITH DIFFERENTIAL/PLATELET
Abs Immature Granulocytes: 0.04 10*3/uL (ref 0.00–0.07)
Basophils Absolute: 0 10*3/uL (ref 0.0–0.1)
Basophils Relative: 0 %
Eosinophils Absolute: 0.2 10*3/uL (ref 0.0–0.5)
Eosinophils Relative: 2 %
HCT: 32.1 % — ABNORMAL LOW (ref 36.0–46.0)
Hemoglobin: 10.9 g/dL — ABNORMAL LOW (ref 12.0–15.0)
Immature Granulocytes: 0 %
Lymphocytes Relative: 24 %
Lymphs Abs: 2.1 10*3/uL (ref 0.7–4.0)
MCH: 31.2 pg (ref 26.0–34.0)
MCHC: 34 g/dL (ref 30.0–36.0)
MCV: 92 fL (ref 80.0–100.0)
Monocytes Absolute: 0.7 10*3/uL (ref 0.1–1.0)
Monocytes Relative: 8 %
Neutro Abs: 5.9 10*3/uL (ref 1.7–7.7)
Neutrophils Relative %: 66 %
Platelets: 183 10*3/uL (ref 150–400)
RBC: 3.49 MIL/uL — ABNORMAL LOW (ref 3.87–5.11)
RDW: 13.4 % (ref 11.5–15.5)
WBC: 8.9 10*3/uL (ref 4.0–10.5)
nRBC: 0 % (ref 0.0–0.2)

## 2023-01-24 LAB — URINALYSIS, MICROSCOPIC (REFLEX)

## 2023-01-24 LAB — LIPASE, BLOOD: Lipase: 22 U/L (ref 11–51)

## 2023-01-24 MED ORDER — SODIUM CHLORIDE 0.9 % IV BOLUS
500.0000 mL | Freq: Once | INTRAVENOUS | Status: AC
  Administered 2023-01-24: 500 mL via INTRAVENOUS

## 2023-01-24 MED ORDER — TRAMADOL HCL 50 MG PO TABS: 50.0000 mg | ORAL_TABLET | Freq: Four times a day (QID) | ORAL | 0 refills | Status: DC | PRN

## 2023-01-24 MED ORDER — IOHEXOL 300 MG/ML  SOLN
100.0000 mL | Freq: Once | INTRAMUSCULAR | Status: AC | PRN
  Administered 2023-01-24: 100 mL via INTRAVENOUS

## 2023-01-24 MED ORDER — SENNOSIDES-DOCUSATE SODIUM 8.6-50 MG PO TABS: 1.0000 | ORAL_TABLET | Freq: Every evening | ORAL | 0 refills | Status: DC | PRN

## 2023-01-24 MED ORDER — MORPHINE SULFATE (PF) 4 MG/ML IV SOLN
4.0000 mg | Freq: Once | INTRAVENOUS | Status: AC
  Administered 2023-01-24: 4 mg via INTRAVENOUS
  Filled 2023-01-24: qty 1

## 2023-01-24 MED ORDER — ONDANSETRON HCL 4 MG/2ML IJ SOLN
4.0000 mg | Freq: Once | INTRAMUSCULAR | Status: AC
  Administered 2023-01-24: 4 mg via INTRAVENOUS
  Filled 2023-01-24: qty 2

## 2023-01-24 NOTE — ED Notes (Signed)
Pts foley flushed with sterile water, total appx 300 mL, until water returned clear.  Able to aspirate multiple clots.  Pts standard drainage bag changed, per her request.  Pt educated on proper bag exchange procedure, provided with home supplies.

## 2023-01-24 NOTE — ED Triage Notes (Signed)
C/O lower abdominal pain. Reported had bladder procedure 3 days ago; and was discharge with indwelling catheter in place. C/O pain in the general groin area. Stated she was constipated and has had some painful BM x 2. C/O that there was some leaking around the catheter.

## 2023-01-24 NOTE — ED Provider Notes (Signed)
Emergency Department Provider Note   I have reviewed the triage vital signs and the nursing notes.   HISTORY  Chief Complaint Post-op Problem   HPI [name] is a 57 y.o. female with PMH of HTN, HLD, DM, and bladder cancer followed by Urology at [facility] s/p cystoscopy with with bladder tumor resection on 4/22 with Dr. Sherlon Handing presents emergency department with lower abdominal and back pain intermittently over the past 24 hours.  She has had intermittent issues with clogged Foley catheter as well.  She continues to have hematuria after her procedure which has not changed.  Her catheter started draining once again but pain continues.  He is also been dealing with constipation.  She did have a bowel movement this morning which was fairly large after taking MiraLAX but pain has returned. No fever or chills.    Past Medical History:  Diagnosis Date   Allergy 07/27/2021   seasonal, environmental   Anxiety    At age of 19   Diabetes [facility])    GERD (gastroesophageal reflux disease)    Gestational diabetes 10/02/1991   when pregnant with daughter    Hyperlipidemia    Hypertension 10/01/1997   At age of 60    Review of Systems  Constitutional: No fever/chills Cardiovascular: Denies chest pain. Respiratory: Denies shortness of breath. Gastrointestinal: Positive lower abdominal pain.  No nausea, no vomiting.   Genitourinary: Negative for dysuria. Musculoskeletal: Positive for back pain. Skin: Negative for rash. Neurological: Negative for headaches.   ____________________________________________   PHYSICAL EXAM:  VITAL SIGNS: ED Triage Vitals  Enc Vitals Group     BP 01/24/23 1659 (!) 140/77     Pulse Rate 01/24/23 1659 73     Resp 01/24/23 1659 18     Temp 01/24/23 1659 98.2 F (36.8 C)     Temp Source 01/24/23 1659 Oral     SpO2 01/24/23 1659 98 %     Weight 01/24/23 1701 137 lb (62.1 kg)     Height 01/24/23 1701  (1.575 m)   Constitutional: Alert and  oriented. Well appearing and in no acute distress. Eyes: Conjunctivae are normal. Head: Atraumatic. Nose: No congestion/rhinnorhea. Mouth/Throat: Mucous membranes are moist.   Neck: No stridor.   Cardiovascular: Normal rate, regular rhythm. Good peripheral circulation. Grossly normal heart sounds.   Respiratory: Normal respiratory effort.  No retractions. Lungs CTAB. Gastrointestinal: Soft and nontender. No distention.  Musculoskeletal: No lower extremity tenderness nor edema. No gross deformities of extremities. Neurologic:  Normal speech and language. No gross focal neurologic deficits are appreciated.  Skin:  Skin is warm, dry and intact. No rash noted.  ____________________________________________   LABS (all labs ordered are listed, but only abnormal results are displayed)  Labs Reviewed  COMPREHENSIVE METABOLIC PANEL - Abnormal; Notable for the following components:      Result Value   Sodium 130 (*)    Potassium 3.3 (*)    CO2 20 (*)    Glucose, Bld 129 (*)    Calcium 8.6 (*)    All other components within normal limits  CBC WITH DIFFERENTIAL/PLATELET - Abnormal; Notable for the following components:   RBC 3.49 (*)    Hemoglobin 10.9 (*)    HCT 32.1 (*)    All other components within normal limits  URINALYSIS, ROUTINE W REFLEX MICROSCOPIC - Abnormal; Notable for the following components:   Color, Urine RED (*)    APPearance CLOUDY (*)    Hgb urine dipstick LARGE (*)  Bilirubin Urine SMALL (*)    Protein, ur >=300 (*)    Leukocytes,Ua SMALL (*)    All other components within normal limits  URINALYSIS, MICROSCOPIC (REFLEX) - Abnormal; Notable for the following components:   Bacteria, UA FEW (*)    All other components within normal limits  URINE CULTURE  LIPASE, BLOOD    _______________________________________  RADIOLOGY  CT ABDOMEN PELVIS W CONTRAST  Result Date: 01/24/2023 CLINICAL DATA:  Acute abdominal pain EXAM: CT ABDOMEN AND PELVIS WITH CONTRAST  TECHNIQUE: Multidetector CT imaging of the abdomen and pelvis was performed using the standard protocol following bolus administration of intravenous contrast. RADIATION DOSE REDUCTION: This exam was performed according to the departmental dose-optimization program which includes automated exposure control, adjustment of the mA and/or kV according to patient size and/or use of iterative reconstruction technique. CONTRAST:  OMNIPAQUE IOHEXOL 300 MG/ML  SOLN COMPARISON:  12/17/2022 FINDINGS: Lower chest: Dependent hypoventilatory changes within the lower lobes. Hepatobiliary: Hepatic steatosis. No focal liver abnormality. Gallbladder is unremarkable. No biliary duct dilation. Pancreas: Unremarkable. No pancreatic ductal dilatation or surrounding inflammatory changes. Spleen: Normal in size without focal abnormality. Adrenals/Urinary Tract: The kidneys enhance normally. No urinary tract calculi. There is mild left hydronephrosis. There is moderate left-sided hydroureter, with abrupt change in caliber of the left ureter at the UVJ, reference image 72/2, unchanged since prior exam. The fungating mass arising from the left posterolateral bladder wall on prior study has been resected in the interim, with indwelling Foley catheter in place. Gas in the bladder lumen consistent with catheter patency. Bladder is only minimally distended, which limits its evaluation. Mild irregular bladder wall thickening could reflect postoperative change or superimposed cystitis. Mild perivesicular fat stranding. The adrenals are unremarkable. Stomach/Bowel: No bowel obstruction or ileus. Normal appendix right lower quadrant. Moderate retained stool within the colon consistent with constipation. No bowel wall thickening. Vascular/Lymphatic: Stable aortic atherosclerosis. Stable fusiform 1.9 cm right common iliac artery aneurysm. No pathologic adenopathy. Reproductive: Status post hysterectomy. No adnexal masses. Other: Trace pelvic free  fluid. No free intraperitoneal gas. No abdominal wall hernia. Musculoskeletal: No acute or destructive bony lesions. Reconstructed images demonstrate no additional findings. IMPRESSION: 1. Interval resection of the fungating mass along the left posterolateral bladder wall. The bladder is only minimally distended, limiting its evaluation. Irregular bladder wall thickening and mild perivesicular fat stranding could reflect postsurgical change versus superimposed cystitis. Please correlate with urinalysis. 2. Chronic left hydroureter and mild left hydronephrosis, with abrupt change in caliber of the ureter near the left UVJ. The left hydronephrosis has developed in the interim. 3. Moderate retained stool within the colon consistent with constipation. No bowel obstruction or ileus. 4. Hepatic steatosis. 5.  Aortic Atherosclerosis (ICD10-I70.0). 6. Stable fusiform 1.9 cm right common iliac artery aneurysm. Electronically Signed   By: Sharlet Salina M.D.   On: 01/24/2023 19:11    ____________________________________________   PROCEDURES  Procedure(s) performed:   Procedures  None  ____________________________________________   INITIAL IMPRESSION / ASSESSMENT AND PLAN / ED COURSE  Pertinent labs & imaging results that were available during my care of the patient were reviewed by me and considered in my medical decision making (see chart for details).   This patient is Presenting for Evaluation of abdominal pain, which does require a range of treatment options, and is a complaint that involves a high risk of morbidity and mortality.  The Differential Diagnoses includes but is not exclusive to acute cholecystitis, intrathoracic causes for epigastric abdominal pain, gastritis, duodenitis,  pancreatitis, small bowel or large bowel obstruction, abdominal aortic aneurysm, hernia, gastritis, etc.   Critical Interventions-    Medications  sodium chloride 0.9 % bolus 500 mL (0 mLs Intravenous Stopped  01/24/23 1939)  morphine (PF) 4 MG/ML injection 4 mg (4 mg Intravenous Given 01/24/23 1745)  ondansetron (ZOFRAN) injection 4 mg (4 mg Intravenous Given 01/24/23 1745)  iohexol (OMNIPAQUE) 300 MG/ML solution 100 mL (100 mLs Intravenous Contrast Given 01/24/23 1840)    Reassessment after intervention: symptoms improved. Foley flushed and flowing well. No retention.    I decided to review pertinent External Data, and in summary patient with recent Urology procedure.   Clinical Laboratory Tests Ordered, included UA with hematuria. No clear infection but difficulty to fully exclude due to blood. No AKI. No leukocytosis.   Radiologic Tests Ordered, included CT abdomen/pelvis. I independently interpreted the images and agree with radiology interpretation.   Cardiac Monitor Tracing which shows NSR.    Social Determinants of Health Risk patient is not an active smoker.   Medical Decision Making: Summary:  Patient presents emergency department with lower abdominal pain, hematuria, constipation.  Gross hematuria on Foley cath but this is unchanged since her surgery.  After flushing, the foley is flowing well. No indication to switch foley or irrigate further at this time. Plan for CT and reassess.   Reevaluation with update and discussion with patient. CT changes discussed. She will call and discuss with Urology in the AM. Has follow up with them on Monday to remove foley. Discussed strict ED return precautions.    Patient's presentation is most consistent with acute presentation with potential threat to life or bodily function.   Disposition: discharge  ____________________________________________  FINAL CLINICAL IMPRESSION(S) / ED DIAGNOSES  Final diagnoses:  Post-operative pain  Constipation, unspecified constipation type     NEW OUTPATIENT MEDICATIONS STARTED DURING THIS VISIT:  Discharge Medication List as of 01/24/2023  7:39 PM     START taking these medications   Details   senna-docusate (SENOKOT-S) 8.6-50 MG tablet Take 1 tablet by mouth at bedtime as needed for moderate constipation., Starting Thu 01/24/2023, Normal    traMADol (ULTRAM) 50 MG tablet Take 1 tablet (50 mg total) by mouth every 6 (six) hours as needed for severe pain., Starting Thu 01/24/2023, Normal        Note:  This document was prepared using Dragon voice recognition software and may include unintentional dictation errors.  Alona Bene, MD, [facility] Emergency Medicine    Caylor Tallarico, Arlyss Repress, MD 01/24/23 [phone]

## 2023-01-24 NOTE — Discharge Instructions (Signed)
Please follow closely with your primary care doctor as well as your urologist in the [facility] system.  I would like for them to review your CT scan and lab work from this visit and coordinate follow-up in the coming week.  Return to the emergency department with any new or suddenly worsening symptoms.

## 2023-01-24 NOTE — ED Notes (Signed)
Cannot discharge, registration in chart.   

## 2023-01-24 NOTE — ED Notes (Signed)

## 2023-01-25 LAB — URINE CULTURE: Culture: NO GROWTH

## 2023-02-22 ENCOUNTER — Emergency Department (HOSPITAL_COMMUNITY)
Admission: EM | Admit: 2023-02-22 | Discharge: 2023-02-22 | Disposition: A | Discharge status: Home / Self Care | Payer: BLUE CROSS/BLUE SHIELD | Attending: Emergency Medicine | Admitting: Emergency Medicine

## 2023-02-22 ENCOUNTER — Other Ambulatory Visit: Payer: Self-pay

## 2023-02-22 ENCOUNTER — Encounter (HOSPITAL_COMMUNITY): Payer: Self-pay | Admitting: Emergency Medicine

## 2023-02-22 DIAGNOSIS — I10 Essential (primary) hypertension: Secondary | ICD-10-CM | POA: Insufficient documentation

## 2023-02-22 DIAGNOSIS — F418 Other specified anxiety disorders: Secondary | ICD-10-CM | POA: Insufficient documentation

## 2023-02-22 DIAGNOSIS — R7401 Elevation of levels of liver transaminase levels: Secondary | ICD-10-CM | POA: Diagnosis not present

## 2023-02-22 DIAGNOSIS — Z794 Long term (current) use of insulin: Secondary | ICD-10-CM | POA: Diagnosis not present

## 2023-02-22 DIAGNOSIS — Y908 Blood alcohol level of 240 mg/100 ml or more: Secondary | ICD-10-CM | POA: Diagnosis not present

## 2023-02-22 DIAGNOSIS — F10920 Alcohol use, unspecified with intoxication, uncomplicated: Secondary | ICD-10-CM

## 2023-02-22 DIAGNOSIS — Z79899 Other long term (current) drug therapy: Secondary | ICD-10-CM | POA: Diagnosis not present

## 2023-02-22 DIAGNOSIS — E119 Type 2 diabetes mellitus without complications: Secondary | ICD-10-CM | POA: Diagnosis not present

## 2023-02-22 DIAGNOSIS — F10129 Alcohol abuse with intoxication, unspecified: Secondary | ICD-10-CM | POA: Diagnosis present

## 2023-02-22 LAB — SALICYLATE LEVEL: Salicylate Lvl: <7 mg/dL — ABNORMAL LOW (ref 7.0–30.0)

## 2023-02-22 LAB — CBC WITH DIFFERENTIAL/PLATELET
Abs Immature Granulocytes: 0.01 10*3/uL (ref 0.00–0.07)
Basophils Absolute: 0.1 10*3/uL (ref 0.0–0.1)
Basophils Relative: 1 %
Eosinophils Absolute: 0.3 10*3/uL (ref 0.0–0.5)
Eosinophils Relative: 6 %
HCT: 39.9 % (ref 36.0–46.0)
Hemoglobin: 13.1 g/dL (ref 12.0–15.0)
Immature Granulocytes: 0 %
Lymphocytes Relative: 40 %
Lymphs Abs: 2 10*3/uL (ref 0.7–4.0)
MCH: 30.7 pg (ref 26.0–34.0)
MCHC: 32.8 g/dL (ref 30.0–36.0)
MCV: 93.4 fL (ref 80.0–100.0)
Monocytes Absolute: 0.3 10*3/uL (ref 0.1–1.0)
Monocytes Relative: 7 %
Neutro Abs: 2.3 10*3/uL (ref 1.7–7.7)
Neutrophils Relative %: 46 %
Platelets: 123 10*3/uL — ABNORMAL LOW (ref 150–400)
RBC: 4.27 MIL/uL (ref 3.87–5.11)
RDW: 14.8 % (ref 11.5–15.5)
WBC: 5 10*3/uL (ref 4.0–10.5)
nRBC: 0 % (ref 0.0–0.2)

## 2023-02-22 LAB — COMPREHENSIVE METABOLIC PANEL
ALT: 62 U/L — ABNORMAL HIGH (ref 0–44)
AST: 73 U/L — ABNORMAL HIGH (ref 15–41)
Albumin: 4.2 g/dL (ref 3.5–5.0)
Alkaline Phosphatase: 72 U/L (ref 38–126)
Anion gap: 12 (ref 5–15)
BUN: 13 mg/dL (ref 6–20)
CO2: 21 mmol/L — ABNORMAL LOW (ref 22–32)
Calcium: 8.3 mg/dL — ABNORMAL LOW (ref 8.9–10.3)
Chloride: 107 mmol/L (ref 98–111)
Creatinine, Ser: 0.55 mg/dL (ref 0.44–1.00)
GFR, Estimated: >60 mL/min (ref >60)
Glucose, Bld: 86 mg/dL (ref 70–99)
Potassium: 4 mmol/L (ref 3.5–5.1)
Sodium: 140 mmol/L (ref 135–145)
Total Bilirubin: 0.6 mg/dL (ref 0.3–1.2)
Total Protein: 7.8 g/dL (ref 6.5–8.1)

## 2023-02-22 LAB — URINALYSIS, ROUTINE W REFLEX MICROSCOPIC
Bacteria, UA: NONE SEEN
Bilirubin Urine: NEGATIVE
Glucose, UA: NEGATIVE mg/dL
Ketones, ur: NEGATIVE mg/dL
Nitrite: NEGATIVE
Protein, ur: 30 mg/dL — AB
Specific Gravity, Urine: 1.012 (ref 1.005–1.030)
pH: 6 (ref 5.0–8.0)

## 2023-02-22 LAB — RAPID URINE DRUG SCREEN, HOSP PERFORMED
Amphetamines: NOT DETECTED
Barbiturates: NOT DETECTED
Benzodiazepines: NOT DETECTED
Cocaine: NOT DETECTED
Opiates: NOT DETECTED
Tetrahydrocannabinol: NOT DETECTED

## 2023-02-22 LAB — ETHANOL: Alcohol, Ethyl (B): 324 mg/dL (ref <10)

## 2023-02-22 LAB — ACETAMINOPHEN LEVEL: Acetaminophen (Tylenol), Serum: <10 ug/mL — ABNORMAL LOW (ref 10–30)

## 2023-02-22 MED ORDER — HYDROXYZINE HCL 25 MG PO TABS
25.0000 mg | ORAL_TABLET | Freq: Three times a day (TID) | ORAL | Status: DC | PRN
  Administered 2023-02-22: 25 mg via ORAL
  Filled 2023-02-22: qty 1

## 2023-02-22 MED ORDER — SODIUM CHLORIDE 0.9 % IV BOLUS
1000.0000 mL | Freq: Once | INTRAVENOUS | Status: AC
  Administered 2023-02-22: 1000 mL via INTRAVENOUS

## 2023-02-22 NOTE — ED Provider Notes (Signed)
[place] EMERGENCY DEPARTMENT AT [facility] Provider Note   CSN: [id number] Arrival date & time: 02/22/23  1812     History HTN, DM Chief Complaint  Patient presents with   Depression   Anxiety    [name] is a 57 y.o. female.  57 year old female with a past medical history of hypertension, diabetes, new diagnosis of stage IV bladder cancer presents to the ED with a chief complaint of binge drinking and depression.  Patient reports she was diagnosed last Tuesday, states since finding out these new she has been drinking 10 glasses of wine daily.  She reports she has barely had any oral intake aside from wine.  She feels that "her life is ending ".  She does have an appointment scheduled for her bladder cancer follow-up on June 11.  She does not have any prior history of alcohol abuse but does drink 4 glasses of wine nightly. Reports she has been feeling very "depressed ".  She is not endorsing any SI, no HI, no visual or auditory hallucinations. She reports hurting all over, currently prescribed gabapentin when she is taking.  Denies any abdominal pain, nausea, vomiting, chest pain, shortness of breath.  The history is provided by the patient and medical records.  Depression Pertinent negatives include no chest pain, no abdominal pain and no shortness of breath.  Anxiety Pertinent negatives include no chest pain, no abdominal pain and no shortness of breath.       Home Medications Prior to Admission medications   Medication Sig Start Date End Date Taking? Authorizing Provider  amLODipine (NORVASC) 10 MG tablet Take 1 tablet (10 mg total) by mouth at bedtime. To lower blood pressure 11/10/21   Claiborne Rigg, NP  Blood Glucose Monitoring Suppl (TRUE METRIX METER) w/Device KIT 1 each by Does not apply route as needed. 01/04/21   Anders Simmonds, PA-C  carvedilol (COREG) 12.5 MG tablet Take 1 tablet (12.5 mg total) by mouth 2 (two) times daily with a meal. 05/10/22    Newlin, Odette Horns, MD  fluticasone (FLONASE) 50 MCG/ACT nasal spray Place 2 sprays into both nostrils daily. 02/01/22   Domenick Gong, MD  gabapentin (NEURONTIN) 300 MG capsule Take 300 mg by mouth in the morning and at bedtime. 10/22/22   [provider]  glucose blood (TRUE METRIX BLOOD GLUCOSE TEST) test strip Use as directed to test blood sugar 3 (three) times daily. 05/23/22   Hoy Register, MD  hydrOXYzine (VISTARIL) 25 MG capsule Take 1 capsule (25 mg total) by mouth every 8 (eight) hours as needed. 11/10/21   Claiborne Rigg, NP  insulin glargine, 1 Unit Dial, (TOUJEO SOLOSTAR) 300 UNIT/ML Solostar Pen Inject 15 Units into the skin daily. 06/29/22   [provider]  Insulin Pen Needle (B-D ULTRAFINE III SHORT PEN) 31G X 8 MM MISC use as directed daily 06/20/21   Claiborne Rigg, NP  lisinopril (ZESTRIL) 40 MG tablet TAKE 1 TABLET (40 MG TOTAL) BY MOUTH DAILY TO LOWER BLOOD PRESSURE 05/23/22   Hoy Register, MD  omeprazole (PRILOSEC) 20 MG capsule Take 1 capsule (20 mg total) by mouth daily. 05/23/22   Hoy Register, MD  phenylephrine (SUDAFED PE) 10 MG TABS tablet Take 10 mg by mouth every 4 (four) hours as needed.    [provider]  rosuvastatin (CRESTOR) 40 MG tablet Take 1 tablet (40 mg total) by mouth daily. 12/20/22 01/19/23  Jerald Kief, MD  senna-docusate (SENOKOT-S) 8.6-50 MG tablet Take  1 tablet by mouth at bedtime as needed for moderate constipation. 01/24/23   Long, Arlyss Repress, MD  traMADol (ULTRAM) 50 MG tablet Take 1 tablet (50 mg total) by mouth every 6 (six) hours as needed for severe pain. 01/24/23   Long, Arlyss Repress, MD  traZODone (DESYREL) 50 MG tablet TAKE 1 TO 2 TABLETS BY MOUTH AT BEDTIME AS NEEDED FOR INSOMNIA 05/23/22   Hoy Register, MD  TRUEplus Lancets 28G MISC 1 each by Does not apply route 3 (three) times daily. 01/04/21   Anders Simmonds, PA-C      Allergies    Patient has no known allergies.    Review of Systems   Review of Systems   Constitutional:  Negative for chills and fever.  Respiratory:  Negative for shortness of breath.   Cardiovascular:  Negative for chest pain.  Gastrointestinal:  Negative for abdominal pain, nausea and vomiting.  Genitourinary:  Negative for flank pain.  Psychiatric/Behavioral:  Positive for depression. The patient is nervous/anxious.   All other systems reviewed and are negative.   Physical Exam Updated Vital Signs BP (!) 143/83   Pulse 71   Temp 98.3 F (36.8 C) (Oral)   Resp 16   SpO2 92%  Physical Exam Vitals and nursing note reviewed.  Constitutional:      Appearance: Normal appearance.     Comments: Clinically intoxicated  HENT:     Head: Normocephalic and atraumatic.     Mouth/Throat:     Mouth: Mucous membranes are dry.  Eyes:     Pupils: Pupils are equal, round, and reactive to light.  Cardiovascular:     Rate and Rhythm: Normal rate.  Pulmonary:     Effort: Pulmonary effort is normal.  Abdominal:     General: Abdomen is flat.  Musculoskeletal:     Cervical back: Normal range of motion and neck supple.  Skin:    General: Skin is warm and dry.  Neurological:     Mental Status: She is alert and oriented to person, place, and time.     ED Results / Procedures / Treatments   Labs (all labs ordered are listed, but only abnormal results are displayed) Labs Reviewed  COMPREHENSIVE METABOLIC PANEL - Abnormal; Notable for the following components:      Result Value   CO2 21 (*)    Calcium 8.3 (*)    AST 73 (*)    ALT 62 (*)    All other components within normal limits  ETHANOL - Abnormal; Notable for the following components:   Alcohol, Ethyl (B) 324 (*)    All other components within normal limits  CBC WITH DIFFERENTIAL/PLATELET - Abnormal; Notable for the following components:   Platelets 123 (*)    All other components within normal limits  ACETAMINOPHEN LEVEL - Abnormal; Notable for the following components:   Acetaminophen (Tylenol), Serum <10 (*)     All other components within normal limits  SALICYLATE LEVEL - Abnormal; Notable for the following components:   Salicylate Lvl <7.0 (*)    All other components within normal limits  URINALYSIS, ROUTINE W REFLEX MICROSCOPIC - Abnormal; Notable for the following components:   Hgb urine dipstick SMALL (*)    Protein, ur 30 (*)    Leukocytes,Ua TRACE (*)    All other components within normal limits  RAPID URINE DRUG SCREEN, HOSP PERFORMED    EKG None  Radiology No results found.  Procedures Procedures    Medications Ordered in ED Medications  hydrOXYzine (ATARAX) tablet 25 mg (25 mg Oral Given 02/22/23 2143)  sodium chloride 0.9 % bolus 1,000 mL (0 mLs Intravenous Stopped 02/22/23 2207)    ED Course/ Medical Decision Making/ A&P Clinical Course as of 02/22/23 2240  Fri Feb 22, 2023  2036 Alcohol, Ethyl (B)(!!): 324 Not a daily alcohol user, reports out of normal after new CA diagnosis.  [JS]  2216 WBC, UA: 11-20 [JS]  2216 Leukocytes,Ua(!): TRACE [JS]    Clinical Course User Index [JS] Claude Manges, PA-C                             Medical Decision Making Amount and/or Complexity of Data Reviewed Labs: ordered. Decision-making details documented in ED Course.  Risk Prescription drug management.   This patient presents to the ED for concern of depression, this involves a number of treatment options, and is a complaint that carries with it a high risk of complications and morbidity.  The differential diagnosis includes SI, HI or depression.    Co morbidities: Discussed in HPI   Brief History:  See HPI.  EMR reviewed including pt PMHx, past surgical history and past visits to ER.   See HPI for more details   Lab Tests:  I ordered and independently interpreted labs.  The pertinent results include:    I personally reviewed all laboratory work and imaging. Metabolic panel without any acute abnormality specifically kidney function within normal limits and no  significant electrolyte abnormalities. CBC without leukocytosis or significant anemia.  Slight elevation of her LFTs, AST 73, ALT 62.  However does not have any guarding on my exam.  Ethanol level was 324, not a daily drinker but reports that she was upset after her new diagnosis of cancer.   Imaging Studies:  No imaging studies ordered for this patient  Medicines ordered:  I ordered medication including bolus  for metabolize Reevaluation of the patient after these medicines showed that the patient improved I have reviewed the patients home medicines and have made adjustments as needed  Reevaluation:  After the interventions noted above I re-evaluated patient and found that they have :improved   Social Determinants of Health:  The patient's social determinants of health were a factor in the care of this patient  Problem List / ED Course:  Patient presents to the ED with chief complaint of depression, newly diagnosed last Tuesday with stage IV bladder cancer, she reports her new treatment was started on June 11.  She felt subset about the diagnoses therefore she began drinking wine daily, has not had any oral intake over the last couple days, reports that she has not been eating.  She has been drinking approximately 10 glasses of wine daily.  She is not a known alcoholic.  She arrived to the ED hemodynamically stable, is very upset during my evaluation and teary-eyed.  Denies any SI, HI, visual or auditory hallucinations. Labs remarkable for some slight elevation in her LFTs, however no guarding on my exam, denies any abdominal pain today.  Ethanol is 324, given fluids to help with metabolites. Patient coming in the room to let me know that she was going into withdrawals, however her alcohol level was 324, no tachycardia, no hypertension, she is requesting her anxiety medication therefore given her oral dose of Atarax. Patient ambulatory in the ED with a steady gait, reports she will call  her husband to pick her up.  We discussed appropriate follow-up  with primary care physician.  Patient is hemodynamically stable for discharge.  Dispostion:  After consideration of the diagnostic results and the patients response to treatment, I feel that the patent would benefit from follow up with her Oncologist for further management.    Portions of this note were generated with Scientist, clinical (histocompatibility and immunogenetics). Dictation errors may occur despite best attempts at proofreading.   Final Clinical Impression(s) / ED Diagnoses Final diagnoses:  Alcoholic intoxication without complication  [facility])    Rx / DC Orders ED Discharge Orders     None         Claude Manges, PA-C 02/22/23 2240    Arby Barrette, MD 02/26/23 1610

## 2023-02-22 NOTE — ED Triage Notes (Addendum)
Patient BIB EMS c/o anxiety and depression. Patient report she recently got diagnosed with bladder cancer stage 4 last Tuesday. Pt report she's been drinking 10 glass a day since. Pt denies SI/HI.

## 2023-02-22 NOTE — ED Notes (Signed)
Pt ambulated to and from bathroom x 3. Tolerated well.

## 2023-11-20 LAB — HEMOGLOBIN A1C: Hemoglobin A1C: 6.1

## 2023-11-30 DIAGNOSIS — Z419 Encounter for procedure for purposes other than remedying health state, unspecified: Secondary | ICD-10-CM | POA: Diagnosis not present

## 2024-01-10 ENCOUNTER — Ambulatory Visit (INDEPENDENT_AMBULATORY_CARE_PROVIDER_SITE_OTHER): Admitting: Family

## 2024-01-10 ENCOUNTER — Encounter: Payer: Self-pay | Admitting: Family

## 2024-01-10 VITALS — BP 98/62 | HR 75 | Ht 62.0 in | Wt 125.6 lb

## 2024-01-10 DIAGNOSIS — F419 Anxiety disorder, unspecified: Secondary | ICD-10-CM | POA: Diagnosis not present

## 2024-01-10 DIAGNOSIS — Z794 Long term (current) use of insulin: Secondary | ICD-10-CM

## 2024-01-10 DIAGNOSIS — E119 Type 2 diabetes mellitus without complications: Secondary | ICD-10-CM | POA: Diagnosis not present

## 2024-01-10 DIAGNOSIS — H6192 Disorder of left external ear, unspecified: Secondary | ICD-10-CM

## 2024-01-10 MED ORDER — FLUTICASONE PROPIONATE 50 MCG/ACT NA SUSP: 2.0000 | Freq: Every day | NASAL | 6 refills | Status: AC

## 2024-01-10 MED ORDER — HYDROXYZINE PAMOATE 25 MG PO CAPS: 25.0000 mg | ORAL_CAPSULE | Freq: Three times a day (TID) | ORAL | 1 refills | Status: AC | PRN

## 2024-01-10 MED ORDER — GABAPENTIN 300 MG PO CAPS: 600.0000 mg | ORAL_CAPSULE | Freq: Every day | ORAL | 0 refills | Status: AC

## 2024-01-10 NOTE — Progress Notes (Signed)
 [name] is a 58 y.o. female with the following history as recorded in Epic[employer]:  Patient Active Problem List   Diagnosis Date Noted   Essential hypertension 12/18/2022   Bladder mass 12/18/2022   Anxiety 12/18/2022   Acute pancreatitis 12/17/2022   Chronic bilateral low back pain without sciatica 10/22/2022   Gastroesophageal reflux disease 06/29/2022   History of hysterectomy 06/29/2022   Controlled type 2 diabetes mellitus without complication, with long-term current use of insulin (HCC) 09/19/2019   Carotid stenosis, left 09/19/2019   Acute colitis 09/15/2019   SIRS (systemic inflammatory response syndrome) (HCC) 09/15/2019   AKI (acute kidney injury) (HCC) 09/15/2019   Mixed hyperlipidemia 06/04/2017   Alcoholic hepatitis without ascites 02/23/2015   HTN (hypertension), malignant 02/21/2015   Current smoker 02/21/2015   Diabetes type 2, uncontrolled 02/21/2015   Poor dentition 02/21/2015   ETOH abuse 02/21/2015    Current Outpatient Medications  Medication Sig Dispense Refill   amLODipine (NORVASC) 10 MG tablet Take 1 tablet (10 mg total) by mouth at bedtime. To lower blood pressure 90 tablet 1   BELSOMRA 10 MG TABS Take 1 tablet by mouth at bedtime as needed.     Blood Glucose Monitoring Suppl (TRUE METRIX METER) w/Device KIT 1 each by Does not apply route as needed. 1 kit 0   carvedilol (COREG) 12.5 MG tablet Take 1 tablet (12.5 mg total) by mouth 2 (two) times daily with a meal. 60 tablet 0   escitalopram (LEXAPRO) 20 MG tablet Take 1 tablet by mouth daily.     glucose blood (TRUE METRIX BLOOD GLUCOSE TEST) test strip Use as directed to test blood sugar 3 (three) times daily. 100 each 0   insulin glargine, 1 Unit Dial, (TOUJEO SOLOSTAR) 300 UNIT/ML Solostar Pen Inject 15 Units into the skin daily.     Insulin Pen Needle (B-D ULTRAFINE III SHORT PEN) 31G X 8 MM MISC use as directed daily 100 each 3   lisinopril (ZESTRIL) 40 MG tablet TAKE 1 TABLET (40 MG TOTAL) BY MOUTH  DAILY TO LOWER BLOOD PRESSURE 30 tablet 0   omeprazole (PRILOSEC) 20 MG capsule Take 1 capsule (20 mg total) by mouth daily. 30 capsule 0   ondansetron (ZOFRAN) 8 MG tablet Take 8 mg by mouth every 8 (eight) hours as needed for nausea or vomiting.     traZODone (DESYREL) 50 MG tablet TAKE 1 TO 2 TABLETS BY MOUTH AT BEDTIME AS NEEDED FOR INSOMNIA 60 tablet 0   TRUEplus Lancets 28G MISC 1 each by Does not apply route 3 (three) times daily. 100 each 11   fluticasone (FLONASE) 50 MCG/ACT nasal spray Place 2 sprays into both nostrils daily. 16 g 6   gabapentin (NEURONTIN) 300 MG capsule Take 2 capsules (600 mg total) by mouth at bedtime. 180 capsule 0   hydrOXYzine (VISTARIL) 25 MG capsule Take 1 capsule (25 mg total) by mouth every 8 (eight) hours as needed. 270 capsule 1   rosuvastatin (CRESTOR) 40 MG tablet Take 1 tablet (40 mg total) by mouth daily. 30 tablet 0   No current facility-administered medications for this visit.    Allergies: Patient has no known allergies.  Past Medical History:  Diagnosis Date   Allergy 07/27/2021   seasonal, environmental   Anxiety    At age of 27   Diabetes [facility])    GERD (gastroesophageal reflux disease)    Gestational diabetes 10/02/1991   when pregnant with daughter    Hyperlipidemia    Hypertension  10/01/1997   At age of 24    Past Surgical History:  Procedure Laterality Date   ABDOMINAL HYSTERECTOMY  1999    Family History  Problem Relation Age of Onset   Hypertension Mother    Diabetes Father    Colon polyps Sister    Hypertension Sister    Hypertension Brother    Throat cancer Maternal Uncle    Colon cancer Neg Hx    Esophageal cancer Neg Hx    Rectal cancer Neg Hx    Stomach cancer Neg Hx     Social History   Tobacco Use   Smoking status: Former    Current packs/day: 0.00    Average packs/day: 1 pack/day for 38.0 years (38.0 ttl pk-yrs)    Types: Cigarettes    Start date: 05/14/1981    Quit date: 05/15/2019    Years since  quitting: 4.6    Passive exposure: Past   Smokeless tobacco: Never  Substance Use Topics   Alcohol use: Yes    Alcohol/week: 5.0 standard drinks of alcohol    Types: 3 Glasses of wine, 2 Cans of beer per week    Comment: 40 oz daily - a glass or two of wine daily up to 4-5 glasses    Subjective:   Presents today as a new patient; has had to transfer care due to her insurance not being accepted by former PCP;  Is continuing to work with oncology for management of bladder cancer- cancer free; scheduled to follow up there again in May 2025;  Did have ovaries and fallopian tubes removed along with bladder in October 2024;      Objective:  Vitals:   01/10/24 1036  BP: 98/62  Pulse: 75  SpO2: 94%  Weight: 125 lb 9.6 oz (57 kg)  Height: 5\' 2"  (1.575 m)    General: Well developed, well nourished, in no acute distress  Skin : Warm and dry.  Head: Normocephalic and atraumatic  Eyes: Sclera and conjunctiva clear; pupils round and reactive to light; extraocular movements intact  Ears: External normal; canals clear; tympanic membranes normal  Oropharynx: Pink, supple. No suspicious lesions  Neck: Supple without thyromegaly, adenopathy  Lungs: Respirations unlabored; clear to auscultation bilaterally without wheeze, rales, rhonchi  CVS exam: normal rate and regular rhythm.  Neurologic: Alert and oriented; speech intact; face symmetrical; moves all extremities well; CNII-XII intact without focal deficit   Assessment:  1. Skin lesion of left ear   2. Anxiety   3. Controlled type 2 diabetes mellitus without complication, with long-term current use of insulin (HCC)     Plan:   Refer to dermatology;  Refills updated as requested;  Follow up in 3 months.   Return in about 3 months (around 04/10/2024) for follow up.  Orders Placed This Encounter  Procedures   Ambulatory referral to Dermatology    Referral Priority:   Routine    Referral Type:   Consultation    Referral Reason:    Specialty Services Required    Requested Specialty:   Dermatology    Number of Visits Requested:   1    Requested Prescriptions   Signed Prescriptions Disp Refills   fluticasone (FLONASE) 50 MCG/ACT nasal spray 16 g 6    Sig: Place 2 sprays into both nostrils daily.   hydrOXYzine (VISTARIL) 25 MG capsule 270 capsule 1    Sig: Take 1 capsule (25 mg total) by mouth every 8 (eight) hours as needed.   gabapentin (NEURONTIN) 300  MG capsule 180 capsule 0    Sig: Take 2 capsules (600 mg total) by mouth at bedtime.

## 2024-01-11 DIAGNOSIS — Z419 Encounter for procedure for purposes other than remedying health state, unspecified: Secondary | ICD-10-CM | POA: Diagnosis not present

## 2024-01-21 ENCOUNTER — Telehealth: Payer: Self-pay

## 2024-01-21 DIAGNOSIS — Z436 Encounter for attention to other artificial openings of urinary tract: Secondary | ICD-10-CM | POA: Diagnosis not present

## 2024-01-21 NOTE — Telephone Encounter (Signed)
 Called Crystal back and left a VM, leaving a verbal. Also advised Crystal the form requested has been faxed back over as well.

## 2024-01-21 NOTE — Telephone Encounter (Signed)
 Copied from CRM [id number]. Topic: General - Other >> Jan 21, 2024  9:12 AM Freya Jesus wrote: Reason for CRM: Crystal from [facility]  - [phone] extension [phone]. - Called regarding patient needing diabetic test supplies. Requesting a verbal for glucometer test strips lancets and lancing device.

## 2024-01-22 DIAGNOSIS — E119 Type 2 diabetes mellitus without complications: Secondary | ICD-10-CM | POA: Diagnosis not present

## 2024-01-23 ENCOUNTER — Telehealth: Payer: Self-pay | Admitting: Family

## 2024-01-23 NOTE — Telephone Encounter (Signed)
 Can you please call her and let her know I am going to be late getting to office on Tuesday? I noticed she has an appointment to discuss Belsomra. Does she just need a refill? I wanted to try to not inconvenience her if possible.

## 2024-01-24 ENCOUNTER — Other Ambulatory Visit: Payer: Self-pay | Admitting: Family

## 2024-01-24 MED ORDER — BELSOMRA 10 MG PO TABS: 1.0000 | ORAL_TABLET | Freq: Every evening | ORAL | 0 refills | Status: DC | PRN

## 2024-01-24 NOTE — Telephone Encounter (Signed)
 Rx has been sent in, pt is aware and expressed understanding, Pt cancelled upcoming appointment.

## 2024-01-24 NOTE — Telephone Encounter (Signed)
 Copied from CRM [id number]. Topic: Clinical - Medication Refill >> Jan 24, 2024  1:14 PM Dimple Francis wrote: Most Recent Primary Care Visit:  Provider: [facility]  Department: [facility]  Visit Type: NEW PT - OFFICE VISIT  Date: 01/10/2024  Medication: BELSOMRA 10 MG TABS  Has the patient contacted their pharmacy? Yes (Agent: If no, request that the patient contact the pharmacy for the refill. If patient does not wish to contact the pharmacy document the reason why and proceed with request.) (Agent: If yes, when and what did the pharmacy advise?)  Is this the correct pharmacy for this prescription? Yes If no, delete pharmacy and type the correct one.  This is the patient's preferred pharmacy:  [id number] - [place], [place] - [address] [place] [address] Phone: [phone] Fax: [phone]   Has the prescription been filled recently? Yes  Is the patient out of the medication? Yes  Has the patient been seen for an appointment in the last year OR does the patient have an upcoming appointment? Yes  Can we respond through MyChart? Yes  Agent: Please be advised that Rx refills may take up to 3 business days. We ask that you follow-up with your pharmacy.

## 2024-01-24 NOTE — Telephone Encounter (Signed)
 Good morning [name]! I hope you are doing well, just wanted to reach out to inform you that [name] will be late into the office on Tuesday. We can see your visit in regards to your medication Belsomra, is it a refill you are needed and we can go ahead and get it sent into your pharmacy that way we do not inconvenience you on Tuesday. Just let us  know works best for you, take care!  Thanks, Camilo Cella CMA

## 2024-01-27 ENCOUNTER — Other Ambulatory Visit: Payer: Self-pay | Admitting: Family

## 2024-01-27 ENCOUNTER — Telehealth: Payer: Self-pay

## 2024-01-27 NOTE — Telephone Encounter (Signed)
 Spoke w/ Pt- informed that we are making change, can we send a prescription for the correct dose please?

## 2024-01-27 NOTE — Telephone Encounter (Signed)
 Copied from CRM [id number]. Topic: Clinical - Prescription Issue >> Jan 27, 2024 11:18 AM Danika B wrote: Reason for CRM: Patient called stating that Rx BELSOMRA 10 MG TABS was previously updated to 20mg . Patient has been receiving the 20mg  dose for some time but on this last refill the Pharmacy dispensed 10mg  and stated patient needed to reach out to PCP to confirm or change dosage to 20mg .   Callback [phone]

## 2024-01-28 ENCOUNTER — Other Ambulatory Visit: Payer: Self-pay | Admitting: Family

## 2024-01-28 ENCOUNTER — Telehealth: Payer: Self-pay

## 2024-01-28 ENCOUNTER — Ambulatory Visit: Admitting: Family

## 2024-01-28 MED ORDER — BELSOMRA 20 MG PO TABS
20.0000 mg | ORAL_TABLET | Freq: Every evening | ORAL | 1 refills | Status: DC | PRN
Start: 2024-01-28 — End: 2024-01-31

## 2024-01-28 NOTE — Telephone Encounter (Signed)
 Spoke with pt, pt states she was advised by pharmacy a prior authorization is needed for medication.

## 2024-01-28 NOTE — Telephone Encounter (Signed)
 Copied from CRM [id number]. Topic: Clinical - Prescription Issue >> Jan 28, 2024  2:45 PM Juleen Oakland F wrote: Reason for CRM: Patient called to follow up on BELSOMRA refill, I let her know that it was sent to the pharmacy this morning at 9am, she said she spoke to the pharmacy and they need authorization for the medication. Please advise.

## 2024-01-28 NOTE — Telephone Encounter (Signed)
 Called pt and left a VM asking pt to give the office a call back regarding medication.

## 2024-01-29 ENCOUNTER — Other Ambulatory Visit (HOSPITAL_COMMUNITY): Payer: Self-pay

## 2024-01-29 ENCOUNTER — Telehealth: Payer: Self-pay

## 2024-01-29 NOTE — Telephone Encounter (Signed)
 Pharmacy Patient Advocate Encounter   Received notification from Pt Calls Messages that prior authorization for Belsomra 20MG  tablets is required/requested.   Insurance verification completed.   The patient is insured through [facility] [place] .   Per test claim: PA required; PA submitted to above mentioned insurance via CoverMyMeds Key/confirmation #/EOC [id number] Status is pending

## 2024-01-29 NOTE — Telephone Encounter (Signed)
 PA request has been Submitted. New Encounter has been or will be created for follow up. For additional info see Pharmacy Prior Auth telephone encounter from 01-29-2024.

## 2024-01-29 NOTE — Telephone Encounter (Signed)
 Pharmacy Patient Advocate Encounter  Received notification from [facility] that Prior Authorization for Belsomra 20MG  tablets has been DENIED.  Full denial letter will be uploaded to the media tab. See denial reason below.   PA #/Case ID/Reference #: [id number]

## 2024-01-29 NOTE — Telephone Encounter (Signed)
 Pharmacy Patient Advocate Encounter   Received notification from CoverMyMeds that prior authorization for  Belsomra 20MG  tablets is required/requested.   Insurance verification completed.   The patient is insured through [facility] [place] .   Per test claim: PA required; PA submitted to above mentioned insurance via CoverMyMeds Key/confirmation #/EOC [id number] Status is pending

## 2024-01-30 ENCOUNTER — Ambulatory Visit: Payer: Self-pay

## 2024-01-30 NOTE — Telephone Encounter (Signed)
 Copied from CRM [id number]. Topic: Clinical - Medication Question >> Jan 30, 2024 10:47 AM Alyse July wrote: Reason for CRM: Patient informed that prior authorization was denied due to at least 2 preferred drugs not being tried prior to medication Belsomra  20MG  tablets being requested. Patient would like a call back to discuss the best alternative medication to help her sleep. [phone].

## 2024-01-30 NOTE — Telephone Encounter (Signed)
 This RN attempted 2nd call to patient. No answer. LVM.

## 2024-01-30 NOTE — Telephone Encounter (Signed)
 Pt reports insurance will no longer pay for her Belsomra , she is requesting and alternative medication for sleep. Pt notes she is unable to take Trazadone as it doesn't work well for her. She would like provider recommendations on alternatives.   Reason for Disposition  Prescription request for new medicine (not a refill)  Answer Assessment - Initial Assessment Questions 1. NAME of MEDICINE: "What medicine(s) are you calling about?"     Belsomra  2. QUESTION: "What is your question?" (e.g., double dose of medicine, side effect)     Pt notes insurance 3. PRESCRIBER: "Who prescribed the medicine?" Reason: if prescribed by specialist, call should be referred to that group.     PCP 4. SYMPTOMS: "Do you have any symptoms?" If Yes, ask: "What symptoms are you having?"  "How bad are the symptoms (e.g., mild, moderate, severe)     None  Protocols used: Medication Question Call-A-AH

## 2024-01-31 ENCOUNTER — Other Ambulatory Visit: Payer: Self-pay | Admitting: Family

## 2024-01-31 MED ORDER — ZOLPIDEM TARTRATE 5 MG PO TABS: 5.0000 mg | ORAL_TABLET | Freq: Every evening | ORAL | 0 refills | Status: DC | PRN

## 2024-01-31 NOTE — Telephone Encounter (Signed)
 Spoke with pt, pt is aware and expressed understanding. Pt stated she has not tried the medications on the list as she was on Trazodone  for 10 years before switching to the Belsomra . Pt states she switched because the Trazodone  was "not working". Pt is requesting to try Ambien for help with sleep. Advised pt a message would be sent back to PCP.   Message has been sent to PCP.

## 2024-01-31 NOTE — Telephone Encounter (Signed)
 Pt. Calling back to ask if there is anything PCP can do to get her insurance to ay foe Belsomra . Please advise pt.

## 2024-01-31 NOTE — Telephone Encounter (Signed)
 Spoke with pt, pt is aware and expressed understanding. Pt stated she has not tried the medications on the list as she was on Trazodone  for 10 years before switching to the Belsomra . Pt states she switched because the Trazodone  was "not working". Pt is requesting to try Ambien for help with sleep. Advised pt a message would be sent back to PCP.

## 2024-02-10 DIAGNOSIS — Z419 Encounter for procedure for purposes other than remedying health state, unspecified: Secondary | ICD-10-CM | POA: Diagnosis not present

## 2024-02-17 NOTE — Telephone Encounter (Signed)
 Pharmacy Patient Advocate Encounter  Received notification from [facility] [employer] that Prior Authorization for Belsomra  20MG  tablets  has been DENIED.  Full denial letter will be uploaded to the media tab. See denial reason below.  Denied. Per the health plan's preferred drug list, at least 2 preferred drugs must be tried before requesting this drug or tell us  why the member cannot try any preferred alternatives. Please send us  supporting chart notes and lab results. Here is list of preferred alternatives: eszopiclone tablet (generic for Lunesta), flurazepam capsule (generic for Dalmane), ramelteon tablet (generic for Rozerem Tablet), temazepam 15mg , 30mg  capsule (generic for Restoril), zaleplon capsule (generic for Sonata), zolpidem  tablet (generic for Ambien ). Note: Some preferred drug(s) may have quantity limits. Refer to the health plan's preferred drug list for additional details. PA #/Case ID/Reference #: [id number]

## 2024-02-20 DIAGNOSIS — Z436 Encounter for attention to other artificial openings of urinary tract: Secondary | ICD-10-CM | POA: Diagnosis not present

## 2024-02-27 ENCOUNTER — Encounter (HOSPITAL_COMMUNITY): Payer: Self-pay

## 2024-02-27 ENCOUNTER — Other Ambulatory Visit: Payer: Self-pay

## 2024-02-27 ENCOUNTER — Inpatient Hospital Stay (HOSPITAL_COMMUNITY)
Admission: EM | Admit: 2024-02-27 | Discharge: 2024-02-29 | DRG: 101 | Disposition: A | Discharge status: Home / Self Care | Attending: Internal Medicine | Admitting: Internal Medicine

## 2024-02-27 ENCOUNTER — Emergency Department (HOSPITAL_COMMUNITY)

## 2024-02-27 DIAGNOSIS — Z833 Family history of diabetes mellitus: Secondary | ICD-10-CM

## 2024-02-27 DIAGNOSIS — K760 Fatty (change of) liver, not elsewhere classified: Secondary | ICD-10-CM | POA: Diagnosis present

## 2024-02-27 DIAGNOSIS — K219 Gastro-esophageal reflux disease without esophagitis: Secondary | ICD-10-CM | POA: Diagnosis present

## 2024-02-27 DIAGNOSIS — F10239 Alcohol dependence with withdrawal, unspecified: Secondary | ICD-10-CM | POA: Diagnosis present

## 2024-02-27 DIAGNOSIS — Z794 Long term (current) use of insulin: Secondary | ICD-10-CM

## 2024-02-27 DIAGNOSIS — R112 Nausea with vomiting, unspecified: Secondary | ICD-10-CM | POA: Diagnosis not present

## 2024-02-27 DIAGNOSIS — F419 Anxiety disorder, unspecified: Secondary | ICD-10-CM | POA: Diagnosis present

## 2024-02-27 DIAGNOSIS — E876 Hypokalemia: Secondary | ICD-10-CM | POA: Diagnosis present

## 2024-02-27 DIAGNOSIS — K7010 Alcoholic hepatitis without ascites: Secondary | ICD-10-CM

## 2024-02-27 DIAGNOSIS — Z83719 Family history of colon polyps, unspecified: Secondary | ICD-10-CM

## 2024-02-27 DIAGNOSIS — E871 Hypo-osmolality and hyponatremia: Secondary | ICD-10-CM | POA: Diagnosis present

## 2024-02-27 DIAGNOSIS — R531 Weakness: Secondary | ICD-10-CM | POA: Diagnosis not present

## 2024-02-27 DIAGNOSIS — D696 Thrombocytopenia, unspecified: Secondary | ICD-10-CM | POA: Diagnosis present

## 2024-02-27 DIAGNOSIS — R569 Unspecified convulsions: Secondary | ICD-10-CM | POA: Diagnosis not present

## 2024-02-27 DIAGNOSIS — G40509 Epileptic seizures related to external causes, not intractable, without status epilepticus: Principal | ICD-10-CM | POA: Diagnosis present

## 2024-02-27 DIAGNOSIS — Z90710 Acquired absence of both cervix and uterus: Secondary | ICD-10-CM

## 2024-02-27 DIAGNOSIS — F32A Depression, unspecified: Secondary | ICD-10-CM | POA: Diagnosis present

## 2024-02-27 DIAGNOSIS — E119 Type 2 diabetes mellitus without complications: Secondary | ICD-10-CM | POA: Diagnosis not present

## 2024-02-27 DIAGNOSIS — R4182 Altered mental status, unspecified: Secondary | ICD-10-CM | POA: Diagnosis not present

## 2024-02-27 DIAGNOSIS — C679 Malignant neoplasm of bladder, unspecified: Secondary | ICD-10-CM | POA: Insufficient documentation

## 2024-02-27 DIAGNOSIS — E1140 Type 2 diabetes mellitus with diabetic neuropathy, unspecified: Secondary | ICD-10-CM | POA: Diagnosis present

## 2024-02-27 DIAGNOSIS — Z808 Family history of malignant neoplasm of other organs or systems: Secondary | ICD-10-CM

## 2024-02-27 DIAGNOSIS — Z87891 Personal history of nicotine dependence: Secondary | ICD-10-CM

## 2024-02-27 DIAGNOSIS — Z789 Other specified health status: Secondary | ICD-10-CM | POA: Diagnosis not present

## 2024-02-27 DIAGNOSIS — Z8249 Family history of ischemic heart disease and other diseases of the circulatory system: Secondary | ICD-10-CM

## 2024-02-27 DIAGNOSIS — E11649 Type 2 diabetes mellitus with hypoglycemia without coma: Secondary | ICD-10-CM | POA: Diagnosis present

## 2024-02-27 DIAGNOSIS — D7589 Other specified diseases of blood and blood-forming organs: Secondary | ICD-10-CM | POA: Diagnosis present

## 2024-02-27 DIAGNOSIS — F10939 Alcohol use, unspecified with withdrawal, unspecified: Secondary | ICD-10-CM

## 2024-02-27 DIAGNOSIS — R7401 Elevation of levels of liver transaminase levels: Secondary | ICD-10-CM | POA: Diagnosis present

## 2024-02-27 DIAGNOSIS — I10 Essential (primary) hypertension: Secondary | ICD-10-CM | POA: Diagnosis not present

## 2024-02-27 DIAGNOSIS — E785 Hyperlipidemia, unspecified: Secondary | ICD-10-CM | POA: Diagnosis present

## 2024-02-27 DIAGNOSIS — Z8551 Personal history of malignant neoplasm of bladder: Secondary | ICD-10-CM

## 2024-02-27 DIAGNOSIS — R442 Other hallucinations: Secondary | ICD-10-CM | POA: Diagnosis not present

## 2024-02-27 DIAGNOSIS — R000 Tachycardia, unspecified: Secondary | ICD-10-CM | POA: Diagnosis not present

## 2024-02-27 DIAGNOSIS — E782 Mixed hyperlipidemia: Secondary | ICD-10-CM | POA: Diagnosis present

## 2024-02-27 DIAGNOSIS — F1010 Alcohol abuse, uncomplicated: Secondary | ICD-10-CM | POA: Diagnosis present

## 2024-02-27 DIAGNOSIS — Z79899 Other long term (current) drug therapy: Secondary | ICD-10-CM

## 2024-02-27 LAB — CBC WITH DIFFERENTIAL/PLATELET
Abs Immature Granulocytes: 0.02 10*3/uL (ref 0.00–0.07)
Basophils Absolute: 0 10*3/uL (ref 0.0–0.1)
Basophils Relative: 0 %
Eosinophils Absolute: 0 10*3/uL (ref 0.0–0.5)
Eosinophils Relative: 0 %
HCT: 37.3 % (ref 36.0–46.0)
Hemoglobin: 12.6 g/dL (ref 12.0–15.0)
Immature Granulocytes: 0 %
Lymphocytes Relative: 13 %
Lymphs Abs: 1 10*3/uL (ref 0.7–4.0)
MCH: 34.1 pg — ABNORMAL HIGH (ref 26.0–34.0)
MCHC: 33.8 g/dL (ref 30.0–36.0)
MCV: 101.1 fL — ABNORMAL HIGH (ref 80.0–100.0)
Monocytes Absolute: 0.7 10*3/uL (ref 0.1–1.0)
Monocytes Relative: 9 %
Neutro Abs: 6.1 10*3/uL (ref 1.7–7.7)
Neutrophils Relative %: 78 %
Platelets: 75 10*3/uL — ABNORMAL LOW (ref 150–400)
RBC: 3.69 MIL/uL — ABNORMAL LOW (ref 3.87–5.11)
RDW: 13.3 % (ref 11.5–15.5)
WBC: 7.8 10*3/uL (ref 4.0–10.5)
nRBC: 0 % (ref 0.0–0.2)

## 2024-02-27 LAB — I-STAT CHEM 8, ED
BUN: 25 mg/dL — ABNORMAL HIGH (ref 6–20)
Calcium, Ion: 1.12 mmol/L — ABNORMAL LOW (ref 1.15–1.40)
Chloride: 97 mmol/L — ABNORMAL LOW (ref 98–111)
Creatinine, Ser: 0.7 mg/dL (ref 0.44–1.00)
Glucose, Bld: 110 mg/dL — ABNORMAL HIGH (ref 70–99)
HCT: 40 % (ref 36.0–46.0)
Hemoglobin: 13.6 g/dL (ref 12.0–15.0)
Potassium: 2.8 mmol/L — ABNORMAL LOW (ref 3.5–5.1)
Sodium: 134 mmol/L — ABNORMAL LOW (ref 135–145)
TCO2: 24 mmol/L (ref 22–32)

## 2024-02-27 LAB — RAPID URINE DRUG SCREEN, HOSP PERFORMED
Amphetamines: NOT DETECTED
Barbiturates: NOT DETECTED
Benzodiazepines: NOT DETECTED
Cocaine: NOT DETECTED
Opiates: NOT DETECTED
Tetrahydrocannabinol: POSITIVE — AB

## 2024-02-27 LAB — COMPREHENSIVE METABOLIC PANEL WITH GFR
ALT: 69 U/L — ABNORMAL HIGH (ref 0–44)
AST: 80 U/L — ABNORMAL HIGH (ref 15–41)
Albumin: 3.8 g/dL (ref 3.5–5.0)
Alkaline Phosphatase: 73 U/L (ref 38–126)
Anion gap: 13 (ref 5–15)
BUN: 27 mg/dL — ABNORMAL HIGH (ref 6–20)
CO2: 24 mmol/L (ref 22–32)
Calcium: 8.7 mg/dL — ABNORMAL LOW (ref 8.9–10.3)
Chloride: 95 mmol/L — ABNORMAL LOW (ref 98–111)
Creatinine, Ser: 0.75 mg/dL (ref 0.44–1.00)
GFR, Estimated: >60 mL/min (ref >60)
Glucose, Bld: 105 mg/dL — ABNORMAL HIGH (ref 70–99)
Potassium: 2.7 mmol/L — CL (ref 3.5–5.1)
Sodium: 132 mmol/L — ABNORMAL LOW (ref 135–145)
Total Bilirubin: 2.1 mg/dL — ABNORMAL HIGH (ref 0.0–1.2)
Total Protein: 6.6 g/dL (ref 6.5–8.1)

## 2024-02-27 LAB — I-STAT CG4 LACTIC ACID, ED
Lactic Acid, Venous: 1.3 mmol/L (ref 0.5–1.9)
Lactic Acid, Venous: 1.2 mmol/L (ref 0.5–1.9)

## 2024-02-27 LAB — PROTIME-INR
INR: 1 (ref 0.8–1.2)
Prothrombin Time: 13.6 s (ref 11.4–15.2)

## 2024-02-27 LAB — URINALYSIS, W/ REFLEX TO CULTURE (INFECTION SUSPECTED)
Bilirubin Urine: NEGATIVE
Glucose, UA: NEGATIVE mg/dL
Hgb urine dipstick: NEGATIVE
Ketones, ur: 5 mg/dL — AB
Nitrite: NEGATIVE
Protein, ur: 100 mg/dL — AB
Specific Gravity, Urine: 1.016 (ref 1.005–1.030)
pH: 7 (ref 5.0–8.0)

## 2024-02-27 LAB — ETHANOL: Alcohol, Ethyl (B): <15 mg/dL (ref <15)

## 2024-02-27 LAB — LIPASE, BLOOD: Lipase: 22 U/L (ref 11–51)

## 2024-02-27 LAB — TROPONIN I (HIGH SENSITIVITY)
Troponin I (High Sensitivity): 9 ng/L (ref <18)
Troponin I (High Sensitivity): 9 ng/L (ref <18)

## 2024-02-27 LAB — AMMONIA: Ammonia: 49 umol/L — ABNORMAL HIGH (ref 9–35)

## 2024-02-27 LAB — CBG MONITORING, ED: Glucose-Capillary: 131 mg/dL — ABNORMAL HIGH (ref 70–99)

## 2024-02-27 MED ORDER — FOLIC ACID 1 MG PO TABS
1.0000 mg | ORAL_TABLET | Freq: Every day | ORAL | Status: DC
  Administered 2024-02-27 – 2024-02-29 (×3): 1 mg via ORAL
  Filled 2024-02-27 (×3): qty 1

## 2024-02-27 MED ORDER — LORAZEPAM 2 MG/ML IJ SOLN
1.0000 mg | Freq: Once | INTRAMUSCULAR | Status: AC
  Administered 2024-02-27: 1 mg via INTRAVENOUS
  Filled 2024-02-27: qty 1

## 2024-02-27 MED ORDER — THIAMINE MONONITRATE 100 MG PO TABS
100.0000 mg | ORAL_TABLET | Freq: Every day | ORAL | Status: DC
  Administered 2024-02-27 – 2024-02-29 (×3): 100 mg via ORAL
  Filled 2024-02-27 (×3): qty 1

## 2024-02-27 MED ORDER — LORAZEPAM 2 MG/ML IJ SOLN
0.0000 mg | Freq: Four times a day (QID) | INTRAMUSCULAR | Status: DC
  Administered 2024-02-27: 2 mg via INTRAVENOUS
  Filled 2024-02-27 (×2): qty 1

## 2024-02-27 MED ORDER — LORAZEPAM 1 MG PO TABS
1.0000 mg | ORAL_TABLET | ORAL | Status: DC | PRN
  Administered 2024-02-28: 1 mg via ORAL
  Filled 2024-02-27: qty 1

## 2024-02-27 MED ORDER — POTASSIUM CHLORIDE 10 MEQ/100ML IV SOLN
10.0000 meq | Freq: Once | INTRAVENOUS | Status: AC
  Administered 2024-02-27: 10 meq via INTRAVENOUS
  Filled 2024-02-27: qty 100

## 2024-02-27 MED ORDER — THIAMINE HCL 100 MG/ML IJ SOLN: 100.0000 mg | Freq: Every day | INTRAMUSCULAR | Status: DC

## 2024-02-27 MED ORDER — LORAZEPAM 2 MG/ML IJ SOLN
1.0000 mg | INTRAMUSCULAR | Status: DC | PRN
  Administered 2024-02-27 – 2024-02-29 (×4): 2 mg via INTRAVENOUS
  Filled 2024-02-27 (×3): qty 1

## 2024-02-27 MED ORDER — LORAZEPAM 2 MG/ML IJ SOLN: 0.0000 mg | Freq: Two times a day (BID) | INTRAMUSCULAR | Status: DC

## 2024-02-27 MED ORDER — ADULT MULTIVITAMIN W/MINERALS CH
1.0000 | ORAL_TABLET | Freq: Every day | ORAL | Status: DC
  Administered 2024-02-27 – 2024-02-29 (×3): 1 via ORAL
  Filled 2024-02-27 (×3): qty 1

## 2024-02-27 MED ORDER — NICOTINE 14 MG/24HR TD PT24
14.0000 mg | MEDICATED_PATCH | Freq: Once | TRANSDERMAL | Status: AC
  Administered 2024-02-27: 14 mg via TRANSDERMAL
  Filled 2024-02-27: qty 1

## 2024-02-27 NOTE — H&P (Incomplete)
 History and Physical    [name]:[id number] DOB: [birth year] DOA: 02/27/2024  Patient coming from: Home.  Chief Complaint: Seizure-like activity.  HPI: [name] is a 58 y.o. female with history of alcohol abuse, hypertension, hyperlipidemia, diabetes mellitus type 2, bladder cancer status post surgery and urostomy was brought to the ER after patient was witnessed to have a seizure-like activity.  Patient states over the last 2 to 3 days she has been having nausea vomiting with poor oral intake.  She has not had a usual intake of alcohol as usual.  Patient's husband with whom ER physician discussed noticed that patient had seizure-like activity and was confused and EMS was called and patient was brought to the ER.  ED Course: In the ER patient was alert awake but tremulous.  CT head is unremarkable.  Alcohol level was undetectable.  Labs show hypokalemia with hyponatremia and elevated LFTs and thrombocytopenia.  Patient was placed on CIWA protocol and admitted for alcohol withdrawal with possible seizures from the withdrawal.  Review of Systems: As per HPI, rest all negative.   Past Medical History:  Diagnosis Date   Allergy 07/27/2021   seasonal, environmental   Anxiety    At age of 69   Diabetes [facility])    GERD (gastroesophageal reflux disease)    Gestational diabetes 10/02/1991   when pregnant with daughter    Hyperlipidemia    Hypertension 10/01/1997   At age of 52    Past Surgical History:  Procedure Laterality Date   ABDOMINAL HYSTERECTOMY  1999     reports that she quit smoking about 4 years ago. Her smoking use included cigarettes. She started smoking about 42 years ago. She has a 38 pack-year smoking history. She has been exposed to tobacco smoke. She has never used smokeless tobacco. She reports current alcohol use of about 5.0 standard drinks of alcohol per week. She reports that she does not use drugs.  No Known Allergies  Family History  Problem Relation  Age of Onset   Hypertension Mother    Diabetes Father    Colon polyps Sister    Hypertension Sister    Hypertension Brother    Throat cancer Maternal Uncle    Colon cancer Neg Hx    Esophageal cancer Neg Hx    Rectal cancer Neg Hx    Stomach cancer Neg Hx     Prior to Admission medications   Medication Sig Start Date End Date Taking? Authorizing Provider  amLODipine  (NORVASC ) 10 MG tablet Take 1 tablet (10 mg total) by mouth at bedtime. To lower blood pressure 11/10/21  Yes Fleming, Zelda W, NP  carvedilol  (COREG ) 12.5 MG tablet Take 1 tablet (12.5 mg total) by mouth 2 (two) times daily with a meal. 05/10/22  Yes Newlin, Enobong, MD  CLARITIN  10 MG tablet Take 10 mg by mouth daily as needed for allergies or rhinitis.   Yes [provider]  escitalopram  (LEXAPRO ) 20 MG tablet Take 20 mg by mouth daily. 11/20/23  Yes [provider]  fluticasone  (FLONASE ) 50 MCG/ACT nasal spray Place 2 sprays into both nostrils daily. Patient taking differently: Place 1 spray into both nostrils See admin instructions. Instill 1 spray into each nostril one to two times a day 01/10/24  Yes Adra Alanis, FNP  gabapentin  (NEURONTIN ) 300 MG capsule Take 2 capsules (600 mg total) by mouth at bedtime. Patient taking differently: Take 300-600 mg by mouth See admin instructions. Take 300 mg by mouth in the morning  and at bedtime OR 600 mg at (only) bedtime 01/10/24  Yes Adra Alanis, FNP  hydrOXYzine  (VISTARIL ) 25 MG capsule Take 1 capsule (25 mg total) by mouth every 8 (eight) hours as needed. Patient taking differently: Take 25 mg by mouth every 8 (eight) hours as needed for anxiety. 01/10/24  Yes Adra Alanis, FNP  insulin  glargine, 1 Unit Dial , (TOUJEO  SOLOSTAR) 300 UNIT/ML Solostar Pen Inject 15 Units into the skin in the morning. 06/29/22  Yes [provider]  lisinopril  (ZESTRIL ) 40 MG tablet TAKE 1 TABLET (40 MG TOTAL) BY MOUTH DAILY TO LOWER BLOOD PRESSURE  05/23/22  Yes Newlin, Lavelle Posey, MD  omeprazole  (PRILOSEC) 20 MG capsule Take 1 capsule (20 mg total) by mouth daily. Patient taking differently: Take 20 mg by mouth daily before breakfast. 05/23/22  Yes Newlin, Enobong, MD  ondansetron  (ZOFRAN ) 8 MG tablet Take 8 mg by mouth every 8 (eight) hours as needed for nausea or vomiting.   Yes [provider]  rosuvastatin  (CRESTOR ) 40 MG tablet Take 1 tablet (40 mg total) by mouth daily. Patient taking differently: Take 40 mg by mouth at bedtime. 12/20/22 02/27/24 Yes Oral Billings, MD  zolpidem  (AMBIEN ) 5 MG tablet Take 1 tablet (5 mg total) by mouth at bedtime as needed for sleep. Patient taking differently: Take 5 mg by mouth at bedtime. 01/31/24  Yes Adra Alanis, FNP  Blood Glucose Monitoring Suppl (TRUE METRIX METER) w/Device KIT 1 each by Does not apply route as needed. 01/04/21   Hassie Lint, PA-C  glucose blood (TRUE METRIX BLOOD GLUCOSE TEST) test strip Use as directed to test blood sugar 3 (three) times daily. 05/23/22   Newlin, Enobong, MD  Insulin  Pen Needle (B-D ULTRAFINE III SHORT PEN) 31G X 8 MM MISC use as directed daily 06/20/21   Fleming, Zelda W, NP  traZODone  (DESYREL ) 50 MG tablet TAKE 1 TO 2 TABLETS BY MOUTH AT BEDTIME AS NEEDED FOR INSOMNIA Patient not taking: Reported on 02/27/2024 05/23/22   Newlin, Enobong, MD  TRUEplus Lancets 28G MISC 1 each by Does not apply route 3 (three) times daily. 01/04/21   Hassie Lint, PA-C    Physical Exam: Constitutional: Moderately built and nourished. Vitals:   02/27/24 1900 02/27/24 2026 02/27/24 2045 02/27/24 2314  BP: (!) 132/117  (!) 141/76 133/69  Pulse: 88  85 84  Resp: (!) 26  (!) 22 19  Temp:  100 F (37.8 C) 99.1 F (37.3 C)   TempSrc:   Oral   SpO2: 94%  98% 92%  Weight:      Height:       Eyes: Anicteric no pallor. ENMT: No discharge from the ears/nose or mouth. Neck: No mass felt.  No neck rigidity. Respiratory: No rhonchi or  crepitations. Cardiovascular: S1-S2 heard. Abdomen: Soft nontender bowel sound present.  Urostomy bag seen. Musculoskeletal: No edema. Skin: No rash. Neurologic: Alert awake oriented time place and person.  Moves all extremities.  Tremulous. Psychiatric: Denies any depression or suicidal thoughts.   Labs on Admission: I have personally reviewed following labs and imaging studies  CBC: Recent Labs  Lab 02/27/24 1655 02/27/24 1707  WBC 7.8  --   NEUTROABS 6.1  --   HGB 12.6 13.6  HCT 37.3 40.0  MCV 101.1*  --   PLT 75*  --    Basic Metabolic Panel: Recent Labs  Lab 02/27/24 1655 02/27/24 1707  NA 132* 134*  K 2.7* 2.8*  CL 95* 97*  CO2 24  --   GLUCOSE 105* 110*  BUN 27* 25*  CREATININE 0.75 0.70  CALCIUM  8.7*  --    GFR: Estimated Creatinine Clearance: 60.6 mL/min (by C-G formula based on SCr of 0.7 mg/dL). Liver Function Tests: Recent Labs  Lab 02/27/24 1655  AST 80*  ALT 69*  ALKPHOS 73  BILITOT 2.1*  PROT 6.6  ALBUMIN 3.8   Recent Labs  Lab 02/27/24 2120  LIPASE 22   Recent Labs  Lab 02/27/24 1655  AMMONIA 49*   Coagulation Profile: Recent Labs  Lab 02/27/24 1655  INR 1.0   Cardiac Enzymes: No results for input(s): "CKTOTAL", "CKMB", "CKMBINDEX", "TROPONINI" in the last 168 hours. BNP (last 3 results) No results for input(s): "PROBNP" in the last 8760 hours. HbA1C: No results for input(s): "HGBA1C" in the last 72 hours. CBG: Recent Labs  Lab 02/27/24 1621  GLUCAP 131*   Lipid Profile: No results for input(s): "CHOL", "HDL", "LDLCALC", "TRIG", "CHOLHDL", "LDLDIRECT" in the last 72 hours. Thyroid Function Tests: No results for input(s): "TSH", "T4TOTAL", "FREET4", "T3FREE", "THYROIDAB" in the last 72 hours. Anemia Panel: No results for input(s): "VITAMINB12", "FOLATE", "FERRITIN", "TIBC", "IRON", "RETICCTPCT" in the last 72 hours. Urine analysis:    Component Value Date/Time   COLORURINE AMBER (A) 02/27/2024 1741   APPEARANCEUR  CLOUDY (A) 02/27/2024 1741   LABSPEC 1.016 02/27/2024 1741   PHURINE 7.0 02/27/2024 1741   GLUCOSEU NEGATIVE 02/27/2024 1741   HGBUR NEGATIVE 02/27/2024 1741   BILIRUBINUR NEGATIVE 02/27/2024 1741   BILIRUBINUR small 02/21/2015 1157   KETONESUR 5 (A) 02/27/2024 1741   PROTEINUR 100 (A) 02/27/2024 1741   UROBILINOGEN >=8.0 02/21/2015 1157   UROBILINOGEN 1.0 05/17/2010 2341   NITRITE NEGATIVE 02/27/2024 1741   LEUKOCYTESUR MODERATE (A) 02/27/2024 1741   Sepsis Labs: @LABRCNTIP (procalcitonin:4,lacticidven:4) ) Recent Results (from the past 240 hours)  Culture, blood (routine x 2)     Status: None (Preliminary result)   Collection Time: 02/27/24  4:55 PM   Specimen: BLOOD LEFT HAND  Result Value Ref Range Status   Specimen Description   Final    BLOOD LEFT HAND Performed at [facility], [address], [place], [place] [address]    Special Requests   Final    BOTTLES DRAWN AEROBIC AND ANAEROBIC Blood Culture results may not be optimal due to an inadequate volume of blood received in culture bottles Performed at [facility], [address], [place], [place] [address]    Culture PENDING  Incomplete   Report Status PENDING  Incomplete     Radiological Exams on Admission: DG Chest Port 1 View Result Date: 02/27/2024 CLINICAL DATA:  Weakness.  Altered mental status. EXAM: PORTABLE CHEST 1 VIEW COMPARISON:  07/26/2012 FINDINGS: The cardiomediastinal contours are normal. The lungs are clear. Pulmonary vasculature is normal. No consolidation, pleural effusion, or pneumothorax. No acute osseous abnormalities are seen. IMPRESSION: No active disease. Electronically Signed   By: Chadwick Colonel M.D.   On: 02/27/2024 18:57   CT Head Wo Contrast Result Date: 02/27/2024 CLINICAL DATA:  Seizure.  Altered mental status. EXAM: CT HEAD WITHOUT CONTRAST TECHNIQUE: Contiguous axial images were obtained from the base of the skull through the vertex without intravenous  contrast. RADIATION DOSE REDUCTION: This exam was performed according to the departmental dose-optimization program which includes automated exposure control, adjustment of the mA and/or kV according to patient size and/or use of iterative reconstruction technique. COMPARISON:  12/04/2006 FINDINGS: Brain: No evidence of intracranial hemorrhage,  acute infarction, hydrocephalus, extra-axial collection, or mass lesion/mass effect. Mild diffuse cerebral atrophy noted. Vascular:  No hyperdense vessel or other acute findings. Skull: No evidence of fracture or other significant bone abnormality. Sinuses/Orbits:  No acute findings. Other: None. IMPRESSION: No acute intracranial abnormality. Mild cerebral atrophy. Electronically Signed   By: Marlyce Sine M.D.   On: 02/27/2024 18:41    EKG: Independently reviewed.  Normal sinus rhythm RBBB.  Assessment/Plan Principal Problem:   Alcohol withdrawal (HCC) Active Problems:   HTN (hypertension), malignant   Controlled type 2 diabetes mellitus without complication, with long-term current use of insulin  (HCC)   Essential hypertension   Nausea & vomiting    Alcohol withdrawal with possible alcohol withdrawal seizure -     will keep patient on CIWA protocol with IV Ativan  and thiamine .  Social work consult. Nausea vomiting has resolved at this time.  Advance diet as tolerated. Elevated LFTs likely from alcoholic hepatitis.  Abdomen appears benign.  Check PT/INR to calculate Madrey's discriminant score.  Check ultrasound of the abdomen and acute hepatitis panel. Hypokalemia with hyponatremia likely from nausea vomiting and poor oral intake from alcohol abuse.  Replace and recheck.  Patient did receive fluids. Diabetes mellitus type 2 takes 15 units glargine in the morning last hemoglobin A1c was 6.1 on 11/20/2023. Hypertension on lisinopril  carvedilol  and amlodipine . Depression on Lexapro. Hyperlipidemia on statins will hold statins until LFTs showing no signs of  fluid worsening. Thrombocytopenia could be from alcohol abuse.  Closely monitor. Macrocytosis likely from alcohol use check B12 and folate levels. GERD on PPI. Urinary bladder cancer status post procedure with urostomy.  Since patient has alcohol withdrawal will need close monitoring and more than 2 midnight stay.   DVT prophylaxis: Lovenox.  PT/INR is pending. Code Status: Full code. Family Communication: Will try to reach patient's husband. Disposition Plan: Medical floor. Consults called: Child psychotherapist. Admission status: Patient.

## 2024-02-27 NOTE — ED Notes (Signed)
 Spoke with patient husband to give him a update on patient at this time after speaking with patient and given the ok to speak with him.

## 2024-02-27 NOTE — ED Provider Notes (Signed)
 [place] EMERGENCY DEPARTMENT AT [facility] Provider Note   CSN: [id number] Arrival date & time: 02/27/24  1552     History  Chief Complaint  Patient presents with   Altered Mental Status    [name] is a 58 y.o. female.  58 year old female with prior medical history as detailed below presents for evaluation.  Patient presents from home after apparent seizure-like activity.  Patient was transported by EMS from home.  Patient's husband contacted by phone for additional information.  He has been reports that the patient was on the porch.  He found her sliding out of her chair.  She was unresponsive and shaking.  This lasted less than 5 minutes.  After the shaking stopped the patient was unresponsive.  During transport with EMS, the patient was confused and unable to provide significant details of recent illness.  On evaluation in the ED the patient reports that she drinks wine every day.  She reports that she has decided to stop drinking about a week ago.  She reports that her last alcohol intake was on Friday of last week.  She reports a prior history of seizure when she was much younger after cessation of benzodiazepine use.  She denies current use of antiepileptics.  Her mentation is improving although, she is still quite confused.  The history is provided by the patient.       Home Medications Prior to Admission medications   Medication Sig Start Date End Date Taking? Authorizing Provider  amLODipine  (NORVASC ) 10 MG tablet Take 1 tablet (10 mg total) by mouth at bedtime. To lower blood pressure 11/10/21   Fleming, Zelda W, NP  Blood Glucose Monitoring Suppl (TRUE METRIX METER) w/Device KIT 1 each by Does not apply route as needed. 01/04/21   Hassie Lint, PA-C  carvedilol  (COREG ) 12.5 MG tablet Take 1 tablet (12.5 mg total) by mouth 2 (two) times daily with a meal. 05/10/22   Newlin, Enobong, MD  escitalopram (LEXAPRO) 20 MG tablet Take 1 tablet by mouth daily.  11/20/23   [provider]  fluticasone  (FLONASE ) 50 MCG/ACT nasal spray Place 2 sprays into both nostrils daily. 01/10/24   Adra Alanis, FNP  gabapentin  (NEURONTIN ) 300 MG capsule Take 2 capsules (600 mg total) by mouth at bedtime. 01/10/24   Adra Alanis, FNP  glucose blood (TRUE METRIX BLOOD GLUCOSE TEST) test strip Use as directed to test blood sugar 3 (three) times daily. 05/23/22   Newlin, Enobong, MD  hydrOXYzine  (VISTARIL ) 25 MG capsule Take 1 capsule (25 mg total) by mouth every 8 (eight) hours as needed. 01/10/24   Adra Alanis, FNP  insulin  glargine, 1 Unit Dial , (TOUJEO  SOLOSTAR) 300 UNIT/ML Solostar Pen Inject 15 Units into the skin daily. 06/29/22   [provider]  Insulin  Pen Needle (B-D ULTRAFINE III SHORT PEN) 31G X 8 MM MISC use as directed daily 06/20/21   Fleming, Zelda W, NP  lisinopril  (ZESTRIL ) 40 MG tablet TAKE 1 TABLET (40 MG TOTAL) BY MOUTH DAILY TO LOWER BLOOD PRESSURE 05/23/22   Newlin, Enobong, MD  omeprazole  (PRILOSEC) 20 MG capsule Take 1 capsule (20 mg total) by mouth daily. 05/23/22   Newlin, Enobong, MD  ondansetron  (ZOFRAN ) 8 MG tablet Take 8 mg by mouth every 8 (eight) hours as needed for nausea or vomiting.    [provider]  rosuvastatin  (CRESTOR ) 40 MG tablet Take 1 tablet (40 mg total) by mouth daily. 12/20/22 01/19/23  Oral Billings, MD  traZODone  (DESYREL ) 50 MG tablet TAKE 1 TO 2 TABLETS BY MOUTH AT BEDTIME AS NEEDED FOR INSOMNIA 05/23/22   Newlin, Enobong, MD  TRUEplus Lancets 28G MISC 1 each by Does not apply route 3 (three) times daily. 01/04/21   Hassie Lint, PA-C  zolpidem  (AMBIEN ) 5 MG tablet Take 1 tablet (5 mg total) by mouth at bedtime as needed for sleep. 01/31/24   Adra Alanis, FNP      Allergies    Patient has no known allergies.    Review of Systems   Review of Systems  All other systems reviewed and are negative.   Physical Exam Updated Vital Signs BP 114/73   Pulse 90    Temp 99.8 F (37.7 C) (Oral)   Resp 20   Ht 5\' 2"  (1.575 m)   Wt 51 kg   SpO2 91%   BMI 20.56 kg/m  Physical Exam Vitals and nursing note reviewed.  Constitutional:      General: She is not in acute distress.    Appearance: Normal appearance. She is well-developed.  HENT:     Head: Normocephalic and atraumatic.  Eyes:     Conjunctiva/sclera: Conjunctivae normal.     Pupils: Pupils are equal, round, and reactive to light.  Cardiovascular:     Rate and Rhythm: Normal rate and regular rhythm.     Heart sounds: Normal heart sounds.  Pulmonary:     Effort: Pulmonary effort is normal. No respiratory distress.     Breath sounds: Normal breath sounds.  Abdominal:     General: There is no distension.     Palpations: Abdomen is soft.     Tenderness: There is no abdominal tenderness.     Comments: Colostomy in right lower quadrant  Musculoskeletal:        General: No deformity. Normal range of motion.     Cervical back: Normal range of motion and neck supple.  Skin:    General: Skin is warm and dry.  Neurological:     General: No focal deficit present.     Mental Status: She is alert.     Comments: Alert, oriented to person, confused to place, recent events, situation.  Normal speech, no facial droop, moves all 4 extremities equally.     ED Results / Procedures / Treatments   Labs (all labs ordered are listed, but only abnormal results are displayed) Labs Reviewed  CBG MONITORING, ED - Abnormal; Notable for the following components:      Result Value   Glucose-Capillary 131 (*)    All other components within normal limits  CULTURE, BLOOD (ROUTINE X 2)  CULTURE, BLOOD (ROUTINE X 2)  CBC WITH DIFFERENTIAL/PLATELET  ETHANOL  COMPREHENSIVE METABOLIC PANEL WITH GFR  URINALYSIS, W/ REFLEX TO CULTURE (INFECTION SUSPECTED)  RAPID URINE DRUG SCREEN, HOSP PERFORMED  PROTIME-INR  AMMONIA  I-STAT CHEM 8, ED  I-STAT CG4 LACTIC ACID, ED  TROPONIN I (HIGH SENSITIVITY)     EKG EKG Interpretation Date/Time:  Thursday Feb 27 2024 16:19:11 EDT Ventricular Rate:  90 PR Interval:  129 QRS Duration:  162 QT Interval:  443 QTC Calculation: 543 R Axis:   99  Text Interpretation: Sinus rhythm RBBB and LPFB Confirmed by Angela Kell [phone]) on 02/27/2024 4:24:22 PM  Radiology No results found.  Procedures Procedures    Medications Ordered in ED Medications  LORazepam  (ATIVAN ) injection 1 mg (has no administration in time range)    ED Course/ Medical Decision Making/ A&P  Medical Decision Making Amount and/or Complexity of Data Reviewed Labs: ordered. Radiology: ordered.  Risk OTC drugs. Prescription drug management. Decision regarding hospitalization.    Medical Screen Complete  This patient presented to the ED with complaint of seizure-like activity.  This complaint involves an extensive number of treatment options. The initial differential diagnosis includes, but is not limited to, seizure, DTs, encephalopathy, alcohol withdrawal, metabolic abnormality, intracranial hemorrhage, etc.  This presentation is: Acute, Chronic, Self-Limited, Previously Undiagnosed, Uncertain Prognosis, Complicated, Systemic Symptoms, and Threat to Life/Bodily Function  Patient presents from home after seizure-like episode.  Patient with AMS and delirium.  Patient with history of withdrawal seizure in past after cessation of benzodiazepines.  Patient with reported significant consumption of alcohol on a daily basis.  Last alcohol consumption is unclear based on patient's history and on interview of the patient's husband.  Presentation today is perhaps most consistent with delirium related to EtOH withdrawal.  Workup also demonstrates hypokalemia.  Patient would benefit from admission for further workup and treatment.    Hospitalist service is aware of case.  Additional history obtained:  External records from outside  sources obtained and reviewed including prior ED visits and prior Inpatient records.    Problem List / ED Course:  AMS, hypokalemia, seizure-like episode  Disposition:  After consideration of the diagnostic results and the patients response to treatment, I feel that the patent would benefit from admission.   CRITICAL CARE Performed by: Burnette Carte   Total critical care time: 30 minutes  Critical care time was exclusive of separately billable procedures and treating other patients.  Critical care was necessary to treat or prevent imminent or life-threatening deterioration.  Critical care was time spent personally by me on the following activities: development of treatment plan with patient and/or surrogate as well as nursing, discussions with consultants, evaluation of patient's response to treatment, examination of patient, obtaining history from patient or surrogate, ordering and performing treatments and interventions, ordering and review of laboratory studies, ordering and review of radiographic studies, pulse oximetry and re-evaluation of patient's condition.         Final Clinical Impression(s) / ED Diagnoses Final diagnoses:  Altered mental status, unspecified altered mental status type    Rx / DC Orders ED Discharge Orders     None         Burnette Carte, MD 02/27/24 1949

## 2024-02-27 NOTE — ED Triage Notes (Signed)
 [facility] for altered mental status from. Patient appears alert, skin flushed and confused. Breathing even and unlabored. Per EMS, pt was sitting on her porch smoking cigarette and had seizure-like activity for less than a minute and was picking up things that was not there. Patient oriented to self but not to place and situation.  G 20 left forearm inserted by EMS. LR 800 ml infused. Zofran  4 mg IV. On oxygen at 3lpm per nasal cannula.

## 2024-02-27 NOTE — ED Notes (Signed)
 ED TO INPATIENT HANDOFF REPORT  Name/Age/Gender [name] 58 y.o. female  Code Status Code Status History     Date Active Date Inactive Code Status Order ID Comments User Context   12/18/2022 0935 12/20/2022 2031 Full Code [id number]  Lorita Rosa, MD Inpatient   09/15/2019 2213 09/17/2019 1645 Full Code [id number]  Angelene Kelly, MD Inpatient    Questions for Most Recent Historical Code Status (Order [id number])     Question Answer   By: Consent: discussion documented in EHR            Home/SNF/Other Home  Chief Complaint Alcohol withdrawal (HCC) [F10.939]  Level of Care/Admitting Diagnosis ED Disposition     ED Disposition  Admit   Condition  --   Comment  Hospital Area: [facility] [[id number]]  Level of Care: Progressive [102]  Admit to Progressive based on following criteria: MULTISYSTEM THREATS such as stable sepsis, metabolic/electrolyte imbalance with or without encephalopathy that is responding to early treatment.  May place patient in observation at [facility] or [facility] if equivalent level of care is available:: No  Covid Evaluation: Asymptomatic - no recent exposure (last 10 days) testing not required  Diagnosis: Alcohol withdrawal (HCC) [291.81.ICD-9-CM]  Admitting Physician: KAKRAKANDY, ARSHAD N [[id number]]  Attending Physician: Angelene Kelly [phone]          Medical History Past Medical History:  Diagnosis Date   Allergy 07/27/2021   seasonal, environmental   Anxiety    At age of 42   Diabetes [facility])    GERD (gastroesophageal reflux disease)    Gestational diabetes 10/02/1991   when pregnant with daughter    Hyperlipidemia    Hypertension 10/01/1997   At age of 75    Allergies No Known Allergies  IV Location/Drains/Wounds Patient Lines/Drains/Airways Status     Active Line/Drains/Airways     Name Placement date Placement time Site Days   Peripheral IV 02/27/24 20 G Anterior;Left Forearm 02/27/24   1618  Forearm  less than 1   Urethral Catheter 16 Fr. --  --  --  --            Labs/Imaging Results for orders placed or performed during the hospital encounter of 02/27/24 (from the past 48 hours)  CBG monitoring, ED     Status: Abnormal   Collection Time: 02/27/24  4:21 PM  Result Value Ref Range   Glucose-Capillary 131 (H) 70 - 99 mg/dL    Comment: Glucose reference range applies only to samples taken after fasting for at least 8 hours.  CBC with Differential     Status: Abnormal   Collection Time: 02/27/24  4:55 PM  Result Value Ref Range   WBC 7.8 4.0 - 10.5 K/uL   RBC 3.69 (L) 3.87 - 5.11 MIL/uL   Hemoglobin 12.6 12.0 - 15.0 g/dL   HCT 36.6 44.0 - 34.7 %   MCV 101.1 (H) 80.0 - 100.0 fL   MCH 34.1 (H) 26.0 - 34.0 pg   MCHC 33.8 30.0 - 36.0 g/dL   RDW 42.5 95.6 - 38.7 %   Platelets 75 (L) 150 - 400 K/uL    Comment: SPECIMEN CHECKED FOR CLOTS Immature Platelet Fraction may be clinically indicated, consider ordering this additional test [id number] REPEATED TO VERIFY PLATELET COUNT CONFIRMED BY SMEAR    nRBC 0.0 0.0 - 0.2 %   Neutrophils Relative % 78 %   Neutro Abs 6.1 1.7 - 7.7 K/uL   Lymphocytes Relative 13 %  Lymphs Abs 1.0 0.7 - 4.0 K/uL   Monocytes Relative 9 %   Monocytes Absolute 0.7 0.1 - 1.0 K/uL   Eosinophils Relative 0 %   Eosinophils Absolute 0.0 0.0 - 0.5 K/uL   Basophils Relative 0 %   Basophils Absolute 0.0 0.0 - 0.1 K/uL   WBC Morphology TOXIC GRANULATION    Immature Granulocytes 0 %   Abs Immature Granulocytes 0.02 0.00 - 0.07 K/uL    Comment: Performed at [facility], [address] [place], [place] [address]  Troponin I (High Sensitivity)     Status: None   Collection Time: 02/27/24  4:55 PM  Result Value Ref Range   Troponin I (High Sensitivity) 9 <18 ng/L    Comment: (NOTE) Elevated high sensitivity troponin I (hsTnI) values and significant  changes across serial measurements may suggest ACS but many other  chronic  and acute conditions are known to elevate hsTnI results.  Refer to the "Links" section for chest pain algorithms and additional  guidance. Performed at [facility], [address], [place], [place] [address]   Comprehensive metabolic panel     Status: Abnormal   Collection Time: 02/27/24  4:55 PM  Result Value Ref Range   Sodium 132 (L) 135 - 145 mmol/L   Potassium 2.7 (LL) 3.5 - 5.1 mmol/L    Comment: CRITICAL RESULT CALLED TO, READ BACK BY AND VERIFIED WITH RN [facility] AT 1837 02/27/24 CRUICKSHANK A    Chloride 95 (L) 98 - 111 mmol/L   CO2 24 22 - 32 mmol/L   Glucose, Bld 105 (H) 70 - 99 mg/dL    Comment: Glucose reference range applies only to samples taken after fasting for at least 8 hours.   BUN 27 (H) 6 - 20 mg/dL   Creatinine, Ser 0.98 0.44 - 1.00 mg/dL   Calcium  8.7 (L) 8.9 - 10.3 mg/dL   Total Protein 6.6 6.5 - 8.1 g/dL   Albumin 3.8 3.5 - 5.0 g/dL   AST 80 (H) 15 - 41 U/L   ALT 69 (H) 0 - 44 U/L   Alkaline Phosphatase 73 38 - 126 U/L   Total Bilirubin 2.1 (H) 0.0 - 1.2 mg/dL   GFR, Estimated >11 >91 mL/min    Comment: (NOTE) Calculated using the CKD-EPI Creatinine Equation (2021)    Anion gap 13 5 - 15    Comment: Performed at [facility], [address], [place], [place] [address]  Protime-INR     Status: None   Collection Time: 02/27/24  4:55 PM  Result Value Ref Range   Prothrombin Time 13.6 11.4 - 15.2 seconds   INR 1.0 0.8 - 1.2    Comment: (NOTE) INR goal varies based on device and disease states. Performed at [facility], [address], [place], [place] [address]   Ammonia     Status: Abnormal   Collection Time: 02/27/24  4:55 PM  Result Value Ref Range   Ammonia 49 (H) 9 - 35 umol/L    Comment: Performed at [facility], [address] [place], [place] [address]  I-stat chem 8, ED     Status: Abnormal   Collection Time: 02/27/24  5:07 PM  Result Value Ref Range    Sodium 134 (L) 135 - 145 mmol/L   Potassium 2.8 (L) 3.5 - 5.1 mmol/L   Chloride 97 (L) 98 - 111 mmol/L   BUN 25 (H) 6 - 20 mg/dL  Creatinine, Ser 0.70 0.44 - 1.00 mg/dL   Glucose, Bld 147 (H) 70 - 99 mg/dL    Comment: Glucose reference range applies only to samples taken after fasting for at least 8 hours.   Calcium , Ion 1.12 (L) 1.15 - 1.40 mmol/L   TCO2 24 22 - 32 mmol/L   Hemoglobin 13.6 12.0 - 15.0 g/dL   HCT 82.9 56.2 - 13.0 %  I-Stat Lactic Acid     Status: None   Collection Time: 02/27/24  5:08 PM  Result Value Ref Range   Lactic Acid, Venous 1.3 0.5 - 1.9 mmol/L  Urinalysis, w/ Reflex to Culture (Infection Suspected) -Urine, Clean Catch     Status: Abnormal   Collection Time: 02/27/24  5:41 PM  Result Value Ref Range   Specimen Source URINE, CLEAN CATCH    Color, Urine AMBER (A) YELLOW    Comment: BIOCHEMICALS MAY BE AFFECTED BY COLOR   APPearance CLOUDY (A) CLEAR   Specific Gravity, Urine 1.016 1.005 - 1.030   pH 7.0 5.0 - 8.0   Glucose, UA NEGATIVE NEGATIVE mg/dL   Hgb urine dipstick NEGATIVE NEGATIVE   Bilirubin Urine NEGATIVE NEGATIVE   Ketones, ur 5 (A) NEGATIVE mg/dL   Protein, ur 865 (A) NEGATIVE mg/dL   Nitrite NEGATIVE NEGATIVE   Leukocytes,Ua MODERATE (A) NEGATIVE   Bacteria, UA MANY (A) NONE SEEN    Comment: Performed at [facility], [address] [place], [place] [address]  Urine rapid drug screen (hosp performed)     Status: Abnormal   Collection Time: 02/27/24  5:41 PM  Result Value Ref Range   Opiates NONE DETECTED NONE DETECTED   Cocaine NONE DETECTED NONE DETECTED   Benzodiazepines NONE DETECTED NONE DETECTED   Amphetamines NONE DETECTED NONE DETECTED   Tetrahydrocannabinol POSITIVE (A) NONE DETECTED   Barbiturates NONE DETECTED NONE DETECTED    Comment: (NOTE) DRUG SCREEN FOR MEDICAL PURPOSES ONLY.  IF CONFIRMATION IS NEEDED FOR ANY PURPOSE, NOTIFY LAB WITHIN 5 DAYS.  LOWEST DETECTABLE LIMITS FOR URINE DRUG SCREEN Drug  Class                     Cutoff (ng/mL) Amphetamine and metabolites    1000 Barbiturate and metabolites    200 Benzodiazepine                 200 Opiates and metabolites        300 Cocaine and metabolites        300 THC                            50 Performed at [facility], [address], [place], [place] [address]   Troponin I (High Sensitivity)     Status: None   Collection Time: 02/27/24  6:15 PM  Result Value Ref Range   Troponin I (High Sensitivity) 9 <18 ng/L    Comment: (NOTE) Elevated high sensitivity troponin I (hsTnI) values and significant  changes across serial measurements may suggest ACS but many other  chronic and acute conditions are known to elevate hsTnI results.  Refer to the "Links" section for chest pain algorithms and additional  guidance. Performed at [facility], [address], [place], [place] [address]   I-Stat Lactic Acid     Status: None   Collection Time: 02/27/24  6:23 PM  Result Value Ref Range   Lactic Acid, Venous 1.2  0.5 - 1.9 mmol/L   DG Chest Port 1 View Result Date: 02/27/2024 CLINICAL DATA:  Weakness.  Altered mental status. EXAM: PORTABLE CHEST 1 VIEW COMPARISON:  07/26/2012 FINDINGS: The cardiomediastinal contours are normal. The lungs are clear. Pulmonary vasculature is normal. No consolidation, pleural effusion, or pneumothorax. No acute osseous abnormalities are seen. IMPRESSION: No active disease. Electronically Signed   By: Chadwick Colonel M.D.   On: 02/27/2024 18:57   CT Head Wo Contrast Result Date: 02/27/2024 CLINICAL DATA:  Seizure.  Altered mental status. EXAM: CT HEAD WITHOUT CONTRAST TECHNIQUE: Contiguous axial images were obtained from the base of the skull through the vertex without intravenous contrast. RADIATION DOSE REDUCTION: This exam was performed according to the departmental dose-optimization program which includes automated exposure control, adjustment of the mA and/or kV  according to patient size and/or use of iterative reconstruction technique. COMPARISON:  12/04/2006 FINDINGS: Brain: No evidence of intracranial hemorrhage, acute infarction, hydrocephalus, extra-axial collection, or mass lesion/mass effect. Mild diffuse cerebral atrophy noted. Vascular:  No hyperdense vessel or other acute findings. Skull: No evidence of fracture or other significant bone abnormality. Sinuses/Orbits:  No acute findings. Other: None. IMPRESSION: No acute intracranial abnormality. Mild cerebral atrophy. Electronically Signed   By: Marlyce Sine M.D.   On: 02/27/2024 18:41    Pending Labs Unresulted Labs (From admission, onward)     Start     Ordered   02/27/24 1649  Lipase, blood  Once,   STAT        02/27/24 1648   02/27/24 1616  Culture, blood (routine x 2)  BLOOD CULTURE X 2,   R (with STAT occurrences)      02/27/24 1615   02/27/24 1615  Ethanol  Once,   URGENT        02/27/24 1615            Vitals/Pain Today's Vitals   02/27/24 1815 02/27/24 1830 02/27/24 1845 02/27/24 1900  BP: 137/68 125/71 118/72 (!) 132/117  Pulse: 93 89 94 88  Resp: 20 (!) 23 (!) 21 (!) 26  Temp:      TempSrc:      SpO2: 97% 94% 94% 94%  Weight:      Height:      PainSc:        Isolation Precautions No active isolations  Medications Medications  nicotine  (NICODERM CQ  - dosed in mg/24 hours) patch 14 mg (14 mg Transdermal Patch Applied 02/27/24 1845)  LORazepam  (ATIVAN ) injection 1 mg (1 mg Intravenous Given 02/27/24 1645)  potassium chloride  10 mEq in 100 mL IVPB (0 mEq Intravenous Stopped 02/27/24 1921)    Mobility walks with person assist

## 2024-02-28 ENCOUNTER — Inpatient Hospital Stay (HOSPITAL_COMMUNITY)

## 2024-02-28 ENCOUNTER — Encounter (HOSPITAL_COMMUNITY): Payer: Self-pay | Admitting: Internal Medicine

## 2024-02-28 DIAGNOSIS — E876 Hypokalemia: Secondary | ICD-10-CM | POA: Diagnosis not present

## 2024-02-28 DIAGNOSIS — R569 Unspecified convulsions: Secondary | ICD-10-CM | POA: Diagnosis not present

## 2024-02-28 DIAGNOSIS — I10 Essential (primary) hypertension: Secondary | ICD-10-CM | POA: Diagnosis not present

## 2024-02-28 DIAGNOSIS — G40509 Epileptic seizures related to external causes, not intractable, without status epilepticus: Secondary | ICD-10-CM | POA: Diagnosis not present

## 2024-02-28 DIAGNOSIS — F10239 Alcohol dependence with withdrawal, unspecified: Secondary | ICD-10-CM | POA: Diagnosis not present

## 2024-02-28 DIAGNOSIS — D696 Thrombocytopenia, unspecified: Secondary | ICD-10-CM | POA: Insufficient documentation

## 2024-02-28 DIAGNOSIS — K760 Fatty (change of) liver, not elsewhere classified: Secondary | ICD-10-CM | POA: Diagnosis not present

## 2024-02-28 DIAGNOSIS — D7589 Other specified diseases of blood and blood-forming organs: Secondary | ICD-10-CM | POA: Diagnosis not present

## 2024-02-28 DIAGNOSIS — Z808 Family history of malignant neoplasm of other organs or systems: Secondary | ICD-10-CM | POA: Diagnosis not present

## 2024-02-28 DIAGNOSIS — F10939 Alcohol use, unspecified with withdrawal, unspecified: Secondary | ICD-10-CM | POA: Diagnosis not present

## 2024-02-28 DIAGNOSIS — C679 Malignant neoplasm of bladder, unspecified: Secondary | ICD-10-CM | POA: Insufficient documentation

## 2024-02-28 DIAGNOSIS — Z79899 Other long term (current) drug therapy: Secondary | ICD-10-CM | POA: Diagnosis not present

## 2024-02-28 DIAGNOSIS — E785 Hyperlipidemia, unspecified: Secondary | ICD-10-CM | POA: Diagnosis not present

## 2024-02-28 DIAGNOSIS — F419 Anxiety disorder, unspecified: Secondary | ICD-10-CM | POA: Diagnosis present

## 2024-02-28 DIAGNOSIS — R7989 Other specified abnormal findings of blood chemistry: Secondary | ICD-10-CM | POA: Diagnosis not present

## 2024-02-28 DIAGNOSIS — E1140 Type 2 diabetes mellitus with diabetic neuropathy, unspecified: Secondary | ICD-10-CM | POA: Diagnosis not present

## 2024-02-28 DIAGNOSIS — R531 Weakness: Secondary | ICD-10-CM | POA: Diagnosis not present

## 2024-02-28 DIAGNOSIS — Z8551 Personal history of malignant neoplasm of bladder: Secondary | ICD-10-CM | POA: Diagnosis not present

## 2024-02-28 DIAGNOSIS — E871 Hypo-osmolality and hyponatremia: Secondary | ICD-10-CM | POA: Diagnosis not present

## 2024-02-28 DIAGNOSIS — K219 Gastro-esophageal reflux disease without esophagitis: Secondary | ICD-10-CM | POA: Diagnosis present

## 2024-02-28 DIAGNOSIS — Z83719 Family history of colon polyps, unspecified: Secondary | ICD-10-CM | POA: Diagnosis not present

## 2024-02-28 DIAGNOSIS — R4182 Altered mental status, unspecified: Secondary | ICD-10-CM | POA: Diagnosis not present

## 2024-02-28 DIAGNOSIS — Z90710 Acquired absence of both cervix and uterus: Secondary | ICD-10-CM | POA: Diagnosis not present

## 2024-02-28 DIAGNOSIS — Z87891 Personal history of nicotine dependence: Secondary | ICD-10-CM | POA: Diagnosis not present

## 2024-02-28 DIAGNOSIS — E11649 Type 2 diabetes mellitus with hypoglycemia without coma: Secondary | ICD-10-CM | POA: Diagnosis not present

## 2024-02-28 DIAGNOSIS — Z833 Family history of diabetes mellitus: Secondary | ICD-10-CM | POA: Diagnosis not present

## 2024-02-28 DIAGNOSIS — Z8249 Family history of ischemic heart disease and other diseases of the circulatory system: Secondary | ICD-10-CM | POA: Diagnosis not present

## 2024-02-28 DIAGNOSIS — Z794 Long term (current) use of insulin: Secondary | ICD-10-CM | POA: Diagnosis not present

## 2024-02-28 DIAGNOSIS — K7010 Alcoholic hepatitis without ascites: Secondary | ICD-10-CM | POA: Diagnosis not present

## 2024-02-28 DIAGNOSIS — F32A Depression, unspecified: Secondary | ICD-10-CM | POA: Diagnosis not present

## 2024-02-28 LAB — HEPATIC FUNCTION PANEL
ALT: 79 U/L — ABNORMAL HIGH (ref 0–44)
AST: 112 U/L — ABNORMAL HIGH (ref 15–41)
Albumin: 3.8 g/dL (ref 3.5–5.0)
Alkaline Phosphatase: 75 U/L (ref 38–126)
Bilirubin, Direct: 0.4 mg/dL — ABNORMAL HIGH (ref 0.0–0.2)
Indirect Bilirubin: 1.7 mg/dL — ABNORMAL HIGH (ref 0.3–0.9)
Total Bilirubin: 2.1 mg/dL — ABNORMAL HIGH (ref 0.0–1.2)
Total Protein: 6.8 g/dL (ref 6.5–8.1)

## 2024-02-28 LAB — CBC WITH DIFFERENTIAL/PLATELET
Abs Immature Granulocytes: 0.02 10*3/uL (ref 0.00–0.07)
Basophils Absolute: 0 10*3/uL (ref 0.0–0.1)
Basophils Relative: 0 %
Eosinophils Absolute: 0 10*3/uL (ref 0.0–0.5)
Eosinophils Relative: 1 %
HCT: 37.5 % (ref 36.0–46.0)
Hemoglobin: 12.5 g/dL (ref 12.0–15.0)
Immature Granulocytes: 0 %
Lymphocytes Relative: 28 %
Lymphs Abs: 1.7 10*3/uL (ref 0.7–4.0)
MCH: 34 pg (ref 26.0–34.0)
MCHC: 33.3 g/dL (ref 30.0–36.0)
MCV: 101.9 fL — ABNORMAL HIGH (ref 80.0–100.0)
Monocytes Absolute: 0.7 10*3/uL (ref 0.1–1.0)
Monocytes Relative: 11 %
Neutro Abs: 3.7 10*3/uL (ref 1.7–7.7)
Neutrophils Relative %: 60 %
Platelets: 73 10*3/uL — ABNORMAL LOW (ref 150–400)
RBC: 3.68 MIL/uL — ABNORMAL LOW (ref 3.87–5.11)
RDW: 13.1 % (ref 11.5–15.5)
WBC: 6.2 10*3/uL (ref 4.0–10.5)
nRBC: 0 % (ref 0.0–0.2)

## 2024-02-28 LAB — HEPATITIS PANEL, ACUTE
HCV Ab: NONREACTIVE
Hep A IgM: NONREACTIVE
Hep B C IgM: NONREACTIVE
Hepatitis B Surface Ag: NONREACTIVE

## 2024-02-28 LAB — BASIC METABOLIC PANEL WITH GFR
Anion gap: 9 (ref 5–15)
Anion gap: 12 (ref 5–15)
BUN: 27 mg/dL — ABNORMAL HIGH (ref 6–20)
BUN: 31 mg/dL — ABNORMAL HIGH (ref 6–20)
CO2: 26 mmol/L (ref 22–32)
CO2: 22 mmol/L (ref 22–32)
Calcium: 8.7 mg/dL — ABNORMAL LOW (ref 8.9–10.3)
Calcium: 8.8 mg/dL — ABNORMAL LOW (ref 8.9–10.3)
Chloride: 99 mmol/L (ref 98–111)
Chloride: 101 mmol/L (ref 98–111)
Creatinine, Ser: 0.7 mg/dL (ref 0.44–1.00)
Creatinine, Ser: 0.74 mg/dL (ref 0.44–1.00)
GFR, Estimated: >60 mL/min (ref >60)
GFR, Estimated: >60 mL/min (ref >60)
Glucose, Bld: 74 mg/dL (ref 70–99)
Glucose, Bld: 67 mg/dL — ABNORMAL LOW (ref 70–99)
Potassium: 2.8 mmol/L — ABNORMAL LOW (ref 3.5–5.1)
Potassium: 3.2 mmol/L — ABNORMAL LOW (ref 3.5–5.1)
Sodium: 134 mmol/L — ABNORMAL LOW (ref 135–145)
Sodium: 135 mmol/L (ref 135–145)

## 2024-02-28 LAB — VITAMIN B12: Vitamin B-12: 692 pg/mL (ref 180–914)

## 2024-02-28 LAB — PROTIME-INR
INR: 1 (ref 0.8–1.2)
Prothrombin Time: 13.2 s (ref 11.4–15.2)

## 2024-02-28 LAB — HIV ANTIBODY (ROUTINE TESTING W REFLEX): HIV Screen 4th Generation wRfx: NONREACTIVE

## 2024-02-28 LAB — GLUCOSE, CAPILLARY
Glucose-Capillary: 73 mg/dL (ref 70–99)
Glucose-Capillary: 84 mg/dL (ref 70–99)
Glucose-Capillary: 65 mg/dL — ABNORMAL LOW (ref 70–99)
Glucose-Capillary: 68 mg/dL — ABNORMAL LOW (ref 70–99)
Glucose-Capillary: 99 mg/dL (ref 70–99)
Glucose-Capillary: 113 mg/dL — ABNORMAL HIGH (ref 70–99)

## 2024-02-28 LAB — FOLATE: Folate: 17.1 ng/mL (ref >5.9)

## 2024-02-28 LAB — PHOSPHORUS: Phosphorus: 3.5 mg/dL (ref 2.5–4.6)

## 2024-02-28 LAB — MAGNESIUM: Magnesium: 1.9 mg/dL (ref 1.7–2.4)

## 2024-02-28 MED ORDER — POTASSIUM CHLORIDE 20 MEQ PO PACK
40.0000 meq | PACK | ORAL | Status: AC
  Administered 2024-02-28 (×2): 40 meq via ORAL
  Filled 2024-02-28 (×2): qty 2

## 2024-02-28 MED ORDER — SODIUM CHLORIDE 0.9 % IV SOLN: INTRAVENOUS | Status: DC

## 2024-02-28 MED ORDER — INSULIN GLARGINE-YFGN 100 UNIT/ML ~~LOC~~ SOLN
15.0000 [IU] | Freq: Every day | SUBCUTANEOUS | Status: DC
  Administered 2024-02-28 – 2024-02-29 (×2): 15 [IU] via SUBCUTANEOUS
  Filled 2024-02-28 (×2): qty 0.15

## 2024-02-28 MED ORDER — LISINOPRIL 20 MG PO TABS
40.0000 mg | ORAL_TABLET | Freq: Every day | ORAL | Status: DC
  Administered 2024-02-28 – 2024-02-29 (×2): 40 mg via ORAL
  Filled 2024-02-28 (×2): qty 2

## 2024-02-28 MED ORDER — INSULIN ASPART 100 UNIT/ML IJ SOLN: 0.0000 [IU] | Freq: Every day | INTRAMUSCULAR | Status: DC

## 2024-02-28 MED ORDER — CARVEDILOL 12.5 MG PO TABS
12.5000 mg | ORAL_TABLET | Freq: Two times a day (BID) | ORAL | Status: DC
  Administered 2024-02-28 – 2024-02-29 (×3): 12.5 mg via ORAL
  Filled 2024-02-28 (×3): qty 1

## 2024-02-28 MED ORDER — POTASSIUM CHLORIDE 10 MEQ/100ML IV SOLN
10.0000 meq | INTRAVENOUS | Status: AC
  Administered 2024-02-28 (×2): 10 meq via INTRAVENOUS
  Filled 2024-02-28 (×2): qty 100

## 2024-02-28 MED ORDER — HYDROXYZINE PAMOATE 25 MG PO CAPS: 25.0000 mg | ORAL_CAPSULE | Freq: Three times a day (TID) | ORAL | Status: DC | PRN

## 2024-02-28 MED ORDER — PANTOPRAZOLE SODIUM 40 MG PO TBEC
40.0000 mg | DELAYED_RELEASE_TABLET | Freq: Every day | ORAL | Status: DC
  Administered 2024-02-28 – 2024-02-29 (×2): 40 mg via ORAL
  Filled 2024-02-28 (×2): qty 1

## 2024-02-28 MED ORDER — ZOLPIDEM TARTRATE 5 MG PO TABS
5.0000 mg | ORAL_TABLET | Freq: Every day | ORAL | Status: DC
  Administered 2024-02-28: 5 mg via ORAL
  Filled 2024-02-28: qty 1

## 2024-02-28 MED ORDER — POTASSIUM CHLORIDE CRYS ER 20 MEQ PO TBCR
40.0000 meq | EXTENDED_RELEASE_TABLET | Freq: Once | ORAL | Status: AC
  Administered 2024-02-28: 40 meq via ORAL
  Filled 2024-02-28: qty 2

## 2024-02-28 MED ORDER — AMLODIPINE BESYLATE 5 MG PO TABS
10.0000 mg | ORAL_TABLET | Freq: Every day | ORAL | Status: DC
  Administered 2024-02-28: 10 mg via ORAL
  Filled 2024-02-28: qty 2

## 2024-02-28 MED ORDER — HYDROXYZINE HCL 25 MG PO TABS
25.0000 mg | ORAL_TABLET | Freq: Three times a day (TID) | ORAL | Status: DC | PRN
  Administered 2024-02-28: 25 mg via ORAL
  Filled 2024-02-28: qty 1

## 2024-02-28 MED ORDER — ESCITALOPRAM OXALATE 20 MG PO TABS
20.0000 mg | ORAL_TABLET | Freq: Every day | ORAL | Status: DC
  Administered 2024-02-28 – 2024-02-29 (×2): 20 mg via ORAL
  Filled 2024-02-28 (×2): qty 1

## 2024-02-28 MED ORDER — INSULIN GLARGINE (1 UNIT DIAL) 300 UNIT/ML ~~LOC~~ SOPN: 15.0000 [IU] | PEN_INJECTOR | Freq: Every morning | SUBCUTANEOUS | Status: DC

## 2024-02-28 MED ORDER — ROSUVASTATIN CALCIUM 20 MG PO TABS: 40.0000 mg | ORAL_TABLET | Freq: Every day | ORAL | Status: DC

## 2024-02-28 MED ORDER — INSULIN ASPART 100 UNIT/ML IJ SOLN: 0.0000 [IU] | Freq: Three times a day (TID) | INTRAMUSCULAR | Status: DC

## 2024-02-28 NOTE — TOC Initial Note (Signed)
 Transition of Care [facility]) - Initial/Assessment Note    Patient Details  Name: [name] MRN: [id number] Date of Birth: 11-Jul-1966  Transition of Care [facility]) CM/SW Contact:    Ruben Corolla, RN Phone Number: 02/28/2024, 12:37 PM  Clinical Narrative: d/c plan home. SA resources added to AVS.                  Expected Discharge Plan: Home/Self Care Barriers to Discharge: Continued Medical Work up   Patient Goals and CMS Choice Patient states their goals for this hospitalization and ongoing recovery are:: Home CMS Medicare.gov Compare Post Acute Care list provided to:: Patient Choice offered to / list presented to : Patient [place] ownership interest in [facility].provided to:: Patient    Expected Discharge Plan and Services                                              Prior Living Arrangements/Services                       Activities of Daily Living   ADL Screening (condition at time of admission) Independently performs ADLs?: Yes (appropriate for developmental age) Is the patient deaf or have difficulty hearing?: No Does the patient have difficulty seeing, even when wearing glasses/contacts?: No Does the patient have difficulty concentrating, remembering, or making decisions?: Yes  Permission Sought/Granted                  Emotional Assessment              Admission diagnosis:  Alcohol withdrawal (HCC) [F10.939] Altered mental status, unspecified altered mental status type [R41.82] Patient Active Problem List   Diagnosis Date Noted   Thrombocytopenia (HCC) 02/28/2024   Bladder cancer (HCC) 02/28/2024   Alcohol withdrawal (HCC) 02/27/2024   Nausea & vomiting 02/27/2024   Essential hypertension 12/18/2022   Bladder mass 12/18/2022   Anxiety 12/18/2022   Acute pancreatitis 12/17/2022   Chronic bilateral low back pain without sciatica 10/22/2022   Gastroesophageal reflux disease 06/29/2022   History of hysterectomy  06/29/2022   Controlled type 2 diabetes mellitus without complication, with long-term current use of insulin  (HCC) 09/19/2019   Carotid stenosis, left 09/19/2019   Acute colitis 09/15/2019   SIRS (systemic inflammatory response syndrome) (HCC) 09/15/2019   AKI (acute kidney injury) (HCC) 09/15/2019   Mixed hyperlipidemia 06/04/2017   Alcoholic hepatitis without ascites 02/23/2015   HTN (hypertension), malignant 02/21/2015   Current smoker 02/21/2015   Diabetes type 2, uncontrolled 02/21/2015   Poor dentition 02/21/2015   ETOH abuse 02/21/2015   PCP:  Adra Alanis, FNP Pharmacy:   [id number] - [place], [place] - [address] [place] [address] Phone: [phone] Fax: [phone]     Social Drivers of Health (SDOH) Social History: SDOH Screenings   Food Insecurity: No Food Insecurity (02/27/2024)  Housing: Low Risk  (02/27/2024)  Transportation Needs: No Transportation Needs (02/27/2024)  Utilities: Not At Risk (02/27/2024)  Depression (PHQ2-9): Low Risk  (01/10/2024)  Tobacco Use: Medium Risk (02/28/2024)   SDOH Interventions:     Readmission Risk Interventions     No data to display

## 2024-02-28 NOTE — Progress Notes (Addendum)
 Patient's Blood Sugar at 16:03 pm 65. Patient alert and oriented and asymptomatic/ Snack given and dinner was ordered. Patient had a snack and then dinner was delivered. Patient ate all of dinner and recheck CBG 99. Maintain current plan of care for the patient.

## 2024-02-28 NOTE — Progress Notes (Signed)
 Hypoglycemic Event  CBG: 65  Treatment: 8 oz juice/soda  Symptoms: None  Follow-up CBG: Time:1700 CBG Result:98  Possible Reasons for Event: Inadequate meal intake  Comments/MD notified:Dr. Katherin Pam, Lyn Sanders

## 2024-02-28 NOTE — Progress Notes (Signed)
 PROGRESS NOTE  Brittny Spangle  DOB: 22-May-1966  PCP: Adra Alanis, FNP ZOX:[id number]  DOA: 02/27/2024  LOS: 0 days  Hospital Day: 2  Brief narrative: [name] is a 58 y.o. female with PMH significant for DM2, HTN, HLD, GERD, anxiety, chronic alcohol abuse, h/o bladder cancer s/p urostomy. 5/29, patient was brought to the ED by EMS from home for seizure-like activity.   Per patient's husband, for the last 2 to 3 days, she was having nausea, vomiting and poor oral intake.  She was unable to consume her regular amount of alcohol. On 5/29, she was sitting on the porch when she had a witnessed generalized tonic-clonic seizure that lasted for about a minute.  She was postictal with EMS and was brought to the ED.  In the ED, patient was afebrile, hemodynamically stable She was alert, awake but tremulous CT head unremarkable Alcohol undetectable Initial labs showed WC count 7.8, hemoglobin 12.6 with MCV 101, platelet low at 75, sodium low at 132, potassium low at 2.7, BUN/creatinine normal, AST/ALT, total bili and ammonia slightly elevated Urinalysis showed cloudy amber-colored urine with moderate leukocytes, negative nitrite and many bacteria UDS positive for THC CT head unremarkable Patient was started on IV fluid, CIWA protocol Admitted to TRH  Subjective: Patient was seen and examined this morning. Alert, awake, able to answer questions.  Sad affect.  Husband at bedside No withdrawal symptoms currently Chart reviewed Blood pressure rising up this morning Most recent labs from this morning with potassium low at 2.8, sodium at 134  Assessment and plan: Suspect alcohol withdrawal seizure  Per report, patient quit drinking a week ago, developed nausea, vomiting and poor oral intake 2 days ago, followed by a witnessed GTCS on 5/29 Per report, patient had history of seizure as a child with BZD withdrawal but no history of epilepsy Currently not on any AED On CIWA protocol  with as needed Ativan  Continue IV fluid, IV antiemetics as needed Continue to monitor  Elevated LFTs Likely due to chronic alcohol use Elevated AST, ALT, total bili, ammonia, normal alk phos Pending right upper quadrant ultrasound Pending acute hepatic panel Recent Labs  Lab 02/27/24 1655 02/27/24 2120 02/28/24 0449  AST 80*  --  112*  ALT 69*  --  79*  ALKPHOS 73  --  75  BILITOT 2.1*  --  2.1*  BILIDIR  --   --  0.4*  IBILI  --   --  1.7*  PROT 6.6  --  6.8  ALBUMIN 3.8  --  3.8  AMMONIA 49*  --   --   INR 1.0  --  1.0  LIPASE  --  22  --   PLT 75*  --  73*      Latest Ref Rng & Units 01/13/2008    8:20 PM  Hepatitis  Hep C Ab NEGATIVE NEG     Severe hypokalemia Potassium severely low less than 3. Oral replacement given this morning.  Repeat BMP this afternoon.  Obtain magnesium  and phosphorus level as well. Recent Labs  Lab 02/27/24 1655 02/27/24 1707 02/28/24 0449  K 2.7* 2.8* 2.8*   Type 2 diabetes mellitus A1c 6.1 in February 2025 PTA meds-Lantus  15 units daily Currently continued on the same as well as SSI/Accu-Cheks Recent Labs  Lab 02/27/24 1621  GLUCAP 131*   Macrocytosis Likely due to chronic alcoholism.  Hemoglobin in normal range. Recent Labs    02/27/24 1655 02/27/24 1707 02/28/24 0449  HGB 12.6 13.6 12.5  MCV 101.1*  --  101.9*  VITAMINB12  --   --  692  FOLATE  --   --  17.1   Thrombocytopenia Platelet running low in 70s due to chronic alcohol use. No active bleeding.  Avoid heparin product for DVT prophylaxis  Hypertension PTA meds- Coreg  12.5 mg twice daily, amlodipine  10 mg daily, lisinopril  40 mg daily Currently continued on all  HLD Crestor  held for elevated LFTs    GERD PPI  Anxiety/depression PTA meds- Lexapro  20 mg daily, Vistaril  PRN, Ambien  as needed Continue the same  Peripheral neuropathy Likely due to chronic alcoholism PTA meds- Neurontin  600 mg nightly  H/o urinary bladder cancer  S/p  urostomy   Mobility: PT eval ordered  Goals of care   Code Status: Full Code     DVT prophylaxis:  SCDs Start: 02/28/24 0136   Antimicrobials: None Fluid: Will start on NS at 75 mL per Consultants: None Family Communication: Husband at bedside  Status: Observation Level of care:  Telemetry   Patient is from: Home Needs to continue in-hospital care: Needs to be monitored for alcohol withdrawal, pending PT eval Anticipated d/c to: Hopefully home in 1 to 2 days   Diet:  Diet Order             Diet heart healthy/carb modified Room service appropriate? Yes; Fluid consistency: Thin  Diet effective now                   Scheduled Meds:  amLODipine   10 mg Oral QHS   carvedilol   12.5 mg Oral BID WC   escitalopram   20 mg Oral Daily   folic acid   1 mg Oral Daily   insulin  glargine-yfgn  15 Units Subcutaneous Daily   lisinopril   40 mg Oral Daily   LORazepam   0-4 mg Intravenous Q6H   Followed by   Cecily Cohen ON 03/01/2024] LORazepam   0-4 mg Intravenous Q12H   multivitamin with minerals  1 tablet Oral Daily   nicotine   14 mg Transdermal Once   pantoprazole   40 mg Oral Daily   thiamine   100 mg Oral Daily   Or   thiamine   100 mg Intravenous Daily   zolpidem   5 mg Oral QHS    PRN meds: hydrOXYzine , LORazepam  **OR** LORazepam    Infusions:    Antimicrobials: Anti-infectives (From admission, onward)    None       Objective: Vitals:   02/28/24 0642 02/28/24 0744  BP: (!) 147/86 (!) 150/82  Pulse: 82 80  Resp: 16   Temp: 98.6 F (37 C)   SpO2: 97%     Intake/Output Summary (Last 24 hours) at 02/28/2024 0858 Last data filed at 02/28/2024 0615 Gross per 24 hour  Intake 680 ml  Output 230 ml  Net 450 ml   Filed Weights   02/27/24 1623  Weight: 51 kg   Weight change:  Body mass index is 20.56 kg/m.   Physical Exam: General exam: Pleasant, middle-aged Caucasian female.  Not in pain Skin: No rashes, lesions or ulcers. HEENT: Atraumatic, normocephalic,  no obvious bleeding Lungs: Clear to auscultation bilaterally,  CVS: S1, S2, no murmur,   GI/Abd: Soft, nontender, nondistended, bowel sound present,   CNS: Alert, awake, oriented to place and person.  No withdrawal symptoms currently Psychiatry: Sad affect Extremities: No pedal edema, no calf tenderness,   Data Review: I have personally reviewed the laboratory data and studies available.  F/u labs ordered Unresulted Labs (From admission, onward)  Start     Ordered   02/29/24 0500  CBC with Differential/Platelet  Tomorrow morning,   R        02/28/24 0858   02/29/24 0500  Comprehensive metabolic panel with GFR  Tomorrow morning,   R        02/28/24 0858   02/28/24 1400  Magnesium   Once-Timed,   R        02/28/24 0858   02/28/24 1400  Phosphorus  Once-Timed,   R        02/28/24 0858   02/28/24 1400  Basic metabolic panel with GFR  Once-Timed,   R        02/28/24 0858   02/28/24 0431  Hepatitis panel, acute  Once,   R        02/28/24 0430   02/28/24 0136  HIV Antibody (routine testing w rflx)  (HIV Antibody (Routine testing w reflex) panel)  Once,   R        02/28/24 0137            Signed, Hoyt Macleod, MD [facility] 02/28/2024

## 2024-02-29 DIAGNOSIS — F10939 Alcohol use, unspecified with withdrawal, unspecified: Secondary | ICD-10-CM | POA: Diagnosis not present

## 2024-02-29 LAB — COMPREHENSIVE METABOLIC PANEL WITH GFR
ALT: 104 U/L — ABNORMAL HIGH (ref 0–44)
AST: 121 U/L — ABNORMAL HIGH (ref 15–41)
Albumin: 3.5 g/dL (ref 3.5–5.0)
Alkaline Phosphatase: 76 U/L (ref 38–126)
Anion gap: 9 (ref 5–15)
BUN: 20 mg/dL (ref 6–20)
CO2: 20 mmol/L — ABNORMAL LOW (ref 22–32)
Calcium: 8.1 mg/dL — ABNORMAL LOW (ref 8.9–10.3)
Chloride: 102 mmol/L (ref 98–111)
Creatinine, Ser: 0.45 mg/dL (ref 0.44–1.00)
GFR, Estimated: >60 mL/min (ref >60)
Glucose, Bld: 70 mg/dL (ref 70–99)
Potassium: 2.8 mmol/L — ABNORMAL LOW (ref 3.5–5.1)
Sodium: 131 mmol/L — ABNORMAL LOW (ref 135–145)
Total Bilirubin: 1.5 mg/dL — ABNORMAL HIGH (ref 0.0–1.2)
Total Protein: 6.4 g/dL — ABNORMAL LOW (ref 6.5–8.1)

## 2024-02-29 LAB — CBC WITH DIFFERENTIAL/PLATELET
Abs Immature Granulocytes: 0.04 10*3/uL (ref 0.00–0.07)
Basophils Absolute: 0 10*3/uL (ref 0.0–0.1)
Basophils Relative: 1 %
Eosinophils Absolute: 0.1 10*3/uL (ref 0.0–0.5)
Eosinophils Relative: 1 %
HCT: 38.6 % (ref 36.0–46.0)
Hemoglobin: 12.4 g/dL (ref 12.0–15.0)
Immature Granulocytes: 1 %
Lymphocytes Relative: 33 %
Lymphs Abs: 1.7 10*3/uL (ref 0.7–4.0)
MCH: 33.8 pg (ref 26.0–34.0)
MCHC: 32.1 g/dL (ref 30.0–36.0)
MCV: 105.2 fL — ABNORMAL HIGH (ref 80.0–100.0)
Monocytes Absolute: 0.7 10*3/uL (ref 0.1–1.0)
Monocytes Relative: 13 %
Neutro Abs: 2.6 10*3/uL (ref 1.7–7.7)
Neutrophils Relative %: 51 %
Platelets: 85 10*3/uL — ABNORMAL LOW (ref 150–400)
RBC: 3.67 MIL/uL — ABNORMAL LOW (ref 3.87–5.11)
RDW: 13 % (ref 11.5–15.5)
WBC: 5.1 10*3/uL (ref 4.0–10.5)
nRBC: 0 % (ref 0.0–0.2)

## 2024-02-29 LAB — GLUCOSE, CAPILLARY
Glucose-Capillary: 73 mg/dL (ref 70–99)
Glucose-Capillary: 116 mg/dL — ABNORMAL HIGH (ref 70–99)

## 2024-02-29 MED ORDER — VITAMIN B-1 100 MG PO TABS: 100.0000 mg | ORAL_TABLET | Freq: Every day | ORAL | 0 refills | Status: AC

## 2024-02-29 MED ORDER — POTASSIUM CHLORIDE CRYS ER 20 MEQ PO TBCR: 40.0000 meq | EXTENDED_RELEASE_TABLET | Freq: Every day | ORAL | 0 refills | Status: AC

## 2024-02-29 MED ORDER — POTASSIUM CHLORIDE CRYS ER 20 MEQ PO TBCR
40.0000 meq | EXTENDED_RELEASE_TABLET | ORAL | Status: AC
  Administered 2024-02-29 (×3): 40 meq via ORAL
  Filled 2024-02-29 (×3): qty 2

## 2024-02-29 MED ORDER — FOLIC ACID 1 MG PO TABS: 1.0000 mg | ORAL_TABLET | Freq: Every day | ORAL | 0 refills | Status: AC

## 2024-02-29 NOTE — Progress Notes (Signed)
 Pt given and explained discharge instructions. Pt given third dose of potassium prior to discharge. Pt dressed in personal clothing. IV and tele removed. Pt taken to main entrance via wheelchair.

## 2024-02-29 NOTE — Discharge Summary (Signed)
 Physician Discharge Summary  [name] ZOX:[id number] DOB: 1966/05/10 DOA: 02/27/2024  PCP: Adra Alanis, FNP  Admit date: 02/27/2024 Discharge date: 02/29/2024  Admitted From: home Discharge disposition: Home  Recommendations at discharge:  Stop alcohol use  You have been started on potassium 40 mEq daily for next 1 week.  Please follow-up with PCP as an outpatient for further monitoring and replacement.   Brief narrative: [name] is a 58 y.o. female with PMH significant for DM2, HTN, HLD, GERD, anxiety, chronic alcohol abuse, h/o bladder cancer s/p urostomy. 5/29, patient was brought to the ED by EMS from home for seizure-like activity.   Per patient's husband, for the last 2 to 3 days, she was having nausea, vomiting and poor oral intake.  She was unable to consume her regular amount of alcohol. On 5/29, she was sitting on the porch when she had a witnessed generalized tonic-clonic seizure that lasted for about a minute.  She was postictal with EMS and was brought to the ED.  In the ED, patient was afebrile, hemodynamically stable She was alert, awake but tremulous CT head unremarkable Alcohol undetectable Initial labs showed WC count 7.8, hemoglobin 12.6 with MCV 101, platelet low at 75, sodium low at 132, potassium low at 2.7, BUN/creatinine normal, AST/ALT, total bili and ammonia slightly elevated Urinalysis showed cloudy amber-colored urine with moderate leukocytes, negative nitrite and many bacteria UDS positive for THC CT head unremarkable Patient was started on IV fluid, CIWA protocol Admitted to [facility]  Subjective: Patient was seen and examined this morning. Sitting up in recliner. Not in distress. Feels better and is ready to go home. Labs this point with potassium low at 2.8.  Aggressive replacement given.  I offered her a repeat labs tomorrow but she prefers to go home. Seen by PT later this morning.  Ambulatory independently.  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  She was not on any antiepileptics at home. In the hospital, her mental status gradually improved without any AEDs.  She has monitored for alcohol withdrawal symptoms.  Needed couple doses of IV Ativan  as needed.  Mental status much better.  No withdrawal this morning.  Adequately hydrated.  Able to tolerate oral intake.  Elevated LFTs Fatty liver Likely due to chronic alcohol use Elevated AST, ALT, total bili, ammonia, normal alk phos Acute hepatic panel nonreactive Ultrasound stated fatty liver Recent Labs  Lab 02/27/24 1655 02/27/24 2120 02/28/24 0449 02/29/24 0605  AST 80*  --  112* 121*  ALT 69*  --  79* 104*  ALKPHOS 73  --  75 76  BILITOT 2.1*  --  2.1* 1.5*  BILIDIR  --   --  0.4*  --   IBILI  --   --  1.7*  --   PROT 6.6  --  6.8 6.4*  ALBUMIN 3.8  --  3.8 3.5  AMMONIA 49*  --   --   --   INR 1.0  --  1.0  --   LIPASE  --  22  --   --   PLT 75*  --  73* 85*   Severe hypokalemia Labs this point with potassium low at 2.8.  Aggressive replacement given.  I offered her a repeat  labs tomorrow but she prefers to go home.  I will start her on potassium 40 mill equivalent daily for next 1 week.  To follow-up with PCP as an outpatient for further monitoring and replacement. Normal magnesium  and phosphorus level Recent Labs  Lab 02/27/24 1655 02/27/24 1707 02/28/24 0449 02/28/24 1409 02/29/24 0605  K 2.7* 2.8* 2.8* 3.2* 2.8*  MG  --   --   --  1.9  --   PHOS  --   --   --  3.5  --    Type 2 diabetes mellitus Hypoglycemia A1c 6.1 in February 2025 PTA meds-Lantus  15 units daily Currently continued on the same.  Blood sugar level was low couple times yesterday.  Improving with improvement in  appetite. Continue the same insulin  regimen at home.  Macrocytosis Likely due to chronic alcoholism.  Hemoglobin in normal range. Recent Labs    02/27/24 1655 02/27/24 1707 02/28/24 0449 02/29/24 0605  HGB 12.6 13.6 12.5 12.4  MCV 101.1*  --  101.9* 105.2*  VITAMINB12  --   --  692  --   FOLATE  --   --  17.1  --    Thrombocytopenia Platelet running low in 70s due to chronic alcohol use. No active bleeding.  Avoid heparin product for DVT prophylaxis  Hypertension PTA meds- Coreg  12.5 mg twice daily, amlodipine  10 mg daily, lisinopril  40 mg daily Currently continued on all  HLD Continue Crestor   GERD PPI  Anxiety/depression PTA meds- Lexapro  20 mg daily, Vistaril  PRN, Ambien  as needed Continue the same  Peripheral neuropathy Likely due to chronic alcoholism PTA meds- Neurontin  600 mg nightly Continue the same  H/o urinary bladder cancer  S/p urostomy   Mobility: PT eval obtained.  Ambulated independently  Goals of care   Code Status: Full Code   Diet:  Diet Order             Diet general           Diet heart healthy/carb modified Room service appropriate? Yes; Fluid consistency: Thin  Diet effective now                   Nutritional status:  Body mass index is 20.56 kg/m.       Wounds:  -    Discharge Exam:   Vitals:   02/29/24 0100 02/29/24 0310 02/29/24 0808 02/29/24 1115  BP: 138/75 (!) 146/78 109/68 114/73  Pulse: 74 74 68 78  Resp:  19 15 20   Temp:  98 F (36.7 C)  97.8 F (36.6 C)  TempSrc:  Oral  Oral  SpO2:  98% 98% 97%  Weight:      Height:        Body mass index is 20.56 kg/m.  General exam: Pleasant, middle-aged female.  Looks older for her stated age Skin: No rashes, lesions or ulcers. HEENT: Atraumatic, normocephalic, no obvious bleeding Lungs: Clear to auscultation bilaterally,  CVS: S1, S2, no murmur,   GI/Abd: Soft, nontender, nondistended, bowel sound present,   CNS: Alert, awake, oriented x 3.  No  withdrawal tremors Psychiatry: Mood appropriate,  Extremities: No pedal edema, no calf tenderness,   Follow ups:    Follow-up Information     Adra Alanis, FNP Follow up.   Specialty: Internal Medicine Contact information: [address] [place] [address] [phone]                 Discharge Instructions:  Discharge Instructions     Call MD for:  difficulty breathing, headache or visual disturbances   Complete by: As directed    Call MD for:  extreme fatigue   Complete by: As directed    Call MD for:  hives   Complete by: As directed    Call MD for:  persistant dizziness or light-headedness   Complete by: As directed    Call MD for:  persistant nausea and vomiting   Complete by: As directed    Call MD for:  severe uncontrolled pain   Complete by: As directed    Call MD for:  temperature >100.4   Complete by: As directed    Diet general   Complete by: As directed    Discharge instructions   Complete by: As directed    Recommendations at discharge:   Stop alcohol use   You have been started on potassium 40 mEq daily for next 1 week.  Please follow-up with PCP as an outpatient for further monitoring and replacement.  General discharge instructions: Follow with Primary MD Adra Alanis, FNP in 7 days  Please request your PCP  to go over your hospital tests, procedures, radiology results at the follow up. Please get your medicines reviewed and adjusted.  Your PCP may decide to repeat certain labs or tests as needed. Do not drive, operate heavy machinery, perform activities at heights, swimming or participation in water activities or provide baby sitting services if your were admitted for syncope or siezures until you have seen by Primary MD or a Neurologist and advised to do so again. [place]  Controlled Substance Reporting System database was reviewed. Do not drive, operate heavy machinery, perform activities at  heights, swim, participate in water activities or provide baby-sitting services while on medications for pain, sleep and mood until your outpatient physician has reevaluated you and advised to do so again.  You are strongly recommended to comply with the dose, frequency and duration of prescribed medications. Activity: As tolerated with Full fall precautions use walker/cane & assistance as needed Avoid using any recreational substances like cigarette, tobacco, alcohol, or non-prescribed drug. If you experience worsening of your admission symptoms, develop shortness of breath, life threatening emergency, suicidal or homicidal thoughts you must seek medical attention immediately by calling 911 or calling your MD immediately  if symptoms less severe. You must read complete instructions/literature along with all the possible adverse reactions/side effects for all the medicines you take and that have been prescribed to you. Take any new medicine only after you have completely understood and accepted all the possible adverse reactions/side effects.  Wear Seat belts while driving. You were cared for by a hospitalist during your hospital stay. If you have any questions about your discharge medications or the care you received while you were in the hospital after you are discharged, you can call the unit and ask to speak with the hospitalist or the covering physician. Once you are discharged, your primary care physician will handle any further medical issues. Please note that NO REFILLS for any discharge medications will be authorized once you are discharged, as it is imperative that you return to your primary care physician (or establish a relationship with a primary care physician if you do not have one).   Increase activity slowly   Complete by: As directed        Discharge Medications:   Allergies as of 02/29/2024   No Known Allergies      Medication  List     TAKE these medications    amLODipine   10 MG tablet Commonly known as: NORVASC  Take 1 tablet (10 mg total) by mouth at bedtime. To lower blood pressure   B-D ULTRAFINE III SHORT PEN 31G X 8 MM Misc Generic drug: Insulin  Pen Needle use as directed daily   carvedilol  12.5 MG tablet Commonly known as: COREG  Take 1 tablet (12.5 mg total) by mouth 2 (two) times daily with a meal.   Claritin  10 MG tablet Generic drug: loratadine  Take 10 mg by mouth daily as needed for allergies or rhinitis.   escitalopram  20 MG tablet Commonly known as: LEXAPRO  Take 20 mg by mouth daily.   fluticasone  50 MCG/ACT nasal spray Commonly known as: FLONASE  Place 2 sprays into both nostrils daily. What changed:  how much to take when to take this additional instructions   folic acid  1 MG tablet Commonly known as: FOLVITE  Take 1 tablet (1 mg total) by mouth daily. Start taking on: March 01, 2024   gabapentin  300 MG capsule Commonly known as: NEURONTIN  Take 2 capsules (600 mg total) by mouth at bedtime. What changed:  how much to take when to take this additional instructions   hydrOXYzine  25 MG capsule Commonly known as: VISTARIL  Take 1 capsule (25 mg total) by mouth every 8 (eight) hours as needed. What changed: reasons to take this   lisinopril  40 MG tablet Commonly known as: ZESTRIL  TAKE 1 TABLET (40 MG TOTAL) BY MOUTH DAILY TO LOWER BLOOD PRESSURE   omeprazole  20 MG capsule Commonly known as: PRILOSEC Take 1 capsule (20 mg total) by mouth daily. What changed: when to take this   ondansetron  8 MG tablet Commonly known as: ZOFRAN  Take 8 mg by mouth every 8 (eight) hours as needed for nausea or vomiting.   potassium chloride  SA 20 MEQ tablet Commonly known as: KLOR-CON  M Take 2 tablets (40 mEq total) by mouth daily for 7 days.   rosuvastatin  40 MG tablet Commonly known as: CRESTOR  Take 1 tablet (40 mg total) by mouth daily. What changed: when to take this   thiamine  100 MG tablet Commonly known as: Vitamin B-1 Take 1  tablet (100 mg total) by mouth daily. Start taking on: March 01, 2024   Toujeo  SoloStar 300 UNIT/ML Solostar Pen Generic drug: insulin  glargine (1 Unit Dial ) Inject 15 Units into the skin in the morning.   traZODone  50 MG tablet Commonly known as: DESYREL  TAKE 1 TO 2 TABLETS BY MOUTH AT BEDTIME AS NEEDED FOR INSOMNIA   True Metrix Blood Glucose Test test strip Generic drug: glucose blood Use as directed to test blood sugar 3 (three) times daily.   True Metrix Meter w/Device Kit 1 each by Does not apply route as needed.   TRUEplus Lancets 28G Misc 1 each by Does not apply route 3 (three) times daily.   zolpidem  5 MG tablet Commonly known as: AMBIEN  Take 1 tablet (5 mg total) by mouth at bedtime as needed for sleep. What changed: when to take this         The results of significant diagnostics from this hospitalization (including imaging, microbiology, ancillary and laboratory) are listed below for reference.    Procedures and Diagnostic Studies:   US  Abdomen Limited RUQ (LIVER/GB) Result Date: 02/28/2024 CLINICAL DATA:  Elevated liver function tests EXAM: ULTRASOUND ABDOMEN LIMITED RIGHT UPPER QUADRANT COMPARISON:  CT 06/21/2023 and 01/24/2023.  Ultrasound 12/17/2022. FINDINGS: Gallbladder: No gallstones or wall thickening visualized. No sonographic Murphy sign  noted by sonographer. Common bile duct: Diameter: 3 mm Liver: Diffusely echogenic hepatic parenchyma consistent with fatty liver infiltration. Portal vein is patent on color Doppler imaging with normal direction of blood flow towards the liver. Other: Study limited by overlapping bowel gas and soft tissue. IMPRESSION: Fatty liver infiltration. No gallstones or ductal dilatation. Electronically Signed   By: Adrianna Horde M.D.   On: 02/28/2024 18:24   DG Chest Port 1 View Result Date: 02/27/2024 CLINICAL DATA:  Weakness.  Altered mental status. EXAM: PORTABLE CHEST 1 VIEW COMPARISON:  07/26/2012 FINDINGS: The cardiomediastinal  contours are normal. The lungs are clear. Pulmonary vasculature is normal. No consolidation, pleural effusion, or pneumothorax. No acute osseous abnormalities are seen. IMPRESSION: No active disease. Electronically Signed   By: Chadwick Colonel M.D.   On: 02/27/2024 18:57   CT Head Wo Contrast Result Date: 02/27/2024 CLINICAL DATA:  Seizure.  Altered mental status. EXAM: CT HEAD WITHOUT CONTRAST TECHNIQUE: Contiguous axial images were obtained from the base of the skull through the vertex without intravenous contrast. RADIATION DOSE REDUCTION: This exam was performed according to the departmental dose-optimization program which includes automated exposure control, adjustment of the mA and/or kV according to patient size and/or use of iterative reconstruction technique. COMPARISON:  12/04/2006 FINDINGS: Brain: No evidence of intracranial hemorrhage, acute infarction, hydrocephalus, extra-axial collection, or mass lesion/mass effect. Mild diffuse cerebral atrophy noted. Vascular:  No hyperdense vessel or other acute findings. Skull: No evidence of fracture or other significant bone abnormality. Sinuses/Orbits:  No acute findings. Other: None. IMPRESSION: No acute intracranial abnormality. Mild cerebral atrophy. Electronically Signed   By: Marlyce Sine M.D.   On: 02/27/2024 18:41     Labs:   Basic Metabolic Panel: Recent Labs  Lab 02/27/24 1655 02/27/24 1707 02/28/24 0449 02/28/24 1409 02/29/24 0605  NA 132* 134* 134* 135 131*  K 2.7* 2.8* 2.8* 3.2* 2.8*  CL 95* 97* 99 101 102  CO2 24  --  26 22 20*  GLUCOSE 105* 110* 74 67* 70  BUN 27* 25* 27* 31* 20  CREATININE 0.75 0.70 0.70 0.74 0.45  CALCIUM  8.7*  --  8.7* 8.8* 8.1*  MG  --   --   --  1.9  --   PHOS  --   --   --  3.5  --    GFR Estimated Creatinine Clearance: 60.6 mL/min (by C-G formula based on SCr of 0.45 mg/dL). Liver Function Tests: Recent Labs  Lab 02/27/24 1655 02/28/24 0449 02/29/24 0605  AST 80* 112* 121*  ALT 69* 79*  104*  ALKPHOS 73 75 76  BILITOT 2.1* 2.1* 1.5*  PROT 6.6 6.8 6.4*  ALBUMIN 3.8 3.8 3.5   Recent Labs  Lab 02/27/24 2120  LIPASE 22   Recent Labs  Lab 02/27/24 1655  AMMONIA 49*   Coagulation profile Recent Labs  Lab 02/27/24 1655 02/28/24 0449  INR 1.0 1.0    CBC: Recent Labs  Lab 02/27/24 1655 02/27/24 1707 02/28/24 0449 02/29/24 0605  WBC 7.8  --  6.2 5.1  NEUTROABS 6.1  --  3.7 2.6  HGB 12.6 13.6 12.5 12.4  HCT 37.3 40.0 37.5 38.6  MCV 101.1*  --  101.9* 105.2*  PLT 75*  --  73* 85*   Cardiac Enzymes: No results for input(s): "CKTOTAL", "CKMB", "CKMBINDEX", "TROPONINI" in the last 168 hours. BNP: Invalid input(s): "POCBNP" CBG: Recent Labs  Lab 02/28/24 1631 02/28/24 1700 02/28/24 2100 02/29/24 0731 02/29/24 1111  GLUCAP 68* 99 113*  73 116*   D-Dimer No results for input(s): "DDIMER" in the last 72 hours. Hgb A1c No results for input(s): "HGBA1C" in the last 72 hours. Lipid Profile No results for input(s): "CHOL", "HDL", "LDLCALC", "TRIG", "CHOLHDL", "LDLDIRECT" in the last 72 hours. Thyroid function studies No results for input(s): "TSH", "T4TOTAL", "T3FREE", "THYROIDAB" in the last 72 hours.  Invalid input(s): "FREET3" Anemia work up Recent Labs    02/28/24 0449  VITAMINB12 692  FOLATE 17.1   Microbiology Recent Results (from the past 240 hours)  Culture, blood (routine x 2)     Status: None (Preliminary result)   Collection Time: 02/27/24  4:55 PM   Specimen: BLOOD LEFT HAND  Result Value Ref Range Status   Specimen Description   Final    BLOOD LEFT HAND Performed at [facility], [address] [place], [place] [address]    Special Requests   Final    BOTTLES DRAWN AEROBIC AND ANAEROBIC Blood Culture results may not be optimal due to an inadequate volume of blood received in culture bottles Performed at [facility], [address] [place], [place] [address]    Culture   Final    NO GROWTH 2  DAYS Performed at [facility], [address], [place], [place] [address]    Report Status PENDING  Incomplete  Culture, blood (Routine X 2) w Reflex to ID Panel     Status: None (Preliminary result)   Collection Time: 02/27/24  4:55 PM   Specimen: BLOOD RIGHT HAND  Result Value Ref Range Status   Specimen Description   Final    BLOOD RIGHT HAND Performed at [facility], [address] [place], [place] [address]    Special Requests   Final    BOTTLES DRAWN AEROBIC AND ANAEROBIC Blood Culture adequate volume Performed at [facility], [address] [place], [place] [address]    Culture   Final    NO GROWTH 1 DAY Performed at [facility], [address], [place], [place] [address]    Report Status PENDING  Incomplete    Time coordinating discharge: 45 minutes  Signed: Buddy Loeffelholz  [facility] 02/29/2024, 3:53 PM

## 2024-02-29 NOTE — Evaluation (Signed)
 Physical Therapy Brief Evaluation and Discharge Note Patient Details Name: [name] MRN: [id number] DOB: 1965/11/17 Today's Date: 02/29/2024   History of Present Illness  58 yo female admitted with ETOH withdrawal, Sz like activity. Hx of ETOH abuse, DM, bladder Ca  Clinical Impression  On eval, pt was Supv-CGA for mobility. She walked ~250 feet around the unit without a device. Intermittent unsteadiness during session. Pt tolerated session well. She is eager to d/c home. Discussed d/c plan-pt plans to return home where she lives with her husband. Do not anticipate pt will need any f/u PT after discharge-pt in agreement.        PT Assessment    Assistance Needed at Discharge       Equipment Recommendations None recommended by PT  Recommendations for Other Services       Precautions/Restrictions Precautions Precautions: Fall Restrictions Weight Bearing Restrictions Per Provider Order: No        Mobility  Bed Mobility          Transfers Overall transfer level: Needs assistance   Transfers: Sit to/from Stand Sit to Stand: Modified independent (Device/Increase time)                Ambulation/Gait Ambulation/Gait assistance: Contact guard assist Gait Distance (Feet): 250 Feet Assistive device: None Gait Pattern/deviations: Step-through pattern   General Gait Details: Intermittent unsteadiness but no overt LOB requiring assistance. Pt denied dizziness. Tolerated distance well.  Home Activity Instructions    Stairs            Modified Rankin (Stroke Patients Only)        Balance             Standing balance-Leahy Scale: Fair            Pertinent Vitals/Pain   Pain Assessment Pain Assessment: No/denies pain     Home Living   Living Arrangements: Spouse/significant other       Home Equipment: Cane - single point        Prior Function        UE/LE Assessment               Communication    Communication Communication: No apparent difficulties     Cognition         General Comments      Exercises     Assessment/Plan    PT Problem List Decreased strength;Decreased range of motion;Decreased activity tolerance;Decreased balance;Decreased mobility;Decreased knowledge of use of DME       PT Visit Diagnosis Difficulty in walking, not elsewhere classified (R26.2)    No Skilled PT     Co-evaluation                AMPAC 6 Clicks Help needed turning from your back to your side while in a flat bed without using bedrails?: A Little Help needed moving from lying on your back to sitting on the side of a flat bed without using bedrails?: A Little Help needed moving to and from a bed to a chair (including a wheelchair)?: A Little Help needed standing up from a chair using your arms (e.g., wheelchair or bedside chair)?: A Little Help needed to walk in hospital room?: A Little Help needed climbing 3-5 steps with a railing? : A Little 6 Click Score: 18      End of Session Equipment Utilized During Treatment: Gait belt Activity Tolerance: Patient tolerated treatment well Patient left: in bed   PT Visit Diagnosis: Difficulty in  walking, not elsewhere classified (R26.2)     Time: 4034-7425 PT Time Calculation (min) (ACUTE ONLY): 15 min  Charges:   PT Evaluation $PT Eval Low Complexity: 1 Low         Tanda Falter, PT Acute Rehabilitation  Office: [phone]

## 2024-03-02 ENCOUNTER — Telehealth: Payer: Self-pay

## 2024-03-02 NOTE — Transitions of Care (Post Inpatient/ED Visit) (Unsigned)
   03/02/2024  Name: [name] MRN: [id number] DOB: Jun 29, 1966  Today's TOC FU Call Status: Today's TOC FU Call Status:: Unsuccessful Call (1st Attempt) Unsuccessful Call (1st Attempt) Date: 03/02/24  Attempted to reach the patient regarding the most recent Inpatient/ED visit.  Follow Up Plan: Additional outreach attempts will be made to reach the patient to complete the Transitions of Care (Post Inpatient/ED visit) call.   Signature Darrall Ellison, LPN [facility] Nurse Health Advisor Direct Dial  [phone]

## 2024-03-03 LAB — CULTURE, BLOOD (ROUTINE X 2): Culture: NO GROWTH

## 2024-03-03 NOTE — Transitions of Care (Post Inpatient/ED Visit) (Unsigned)
   03/03/2024  Name: [name] MRN: [id number] DOB: 30-Sep-1966  Today's TOC FU Call Status: Today's TOC FU Call Status:: Unsuccessful Call (2nd Attempt) Unsuccessful Call (1st Attempt) Date: 03/02/24 Unsuccessful Call (2nd Attempt) Date: 03/03/24  Attempted to reach the patient regarding the most recent Inpatient/ED visit.  Follow Up Plan: Additional outreach attempts will be made to reach the patient to complete the Transitions of Care (Post Inpatient/ED visit) call.   Signature Darrall Ellison, LPN [facility] Nurse Health Advisor Direct Dial  [phone]

## 2024-03-04 LAB — CULTURE, BLOOD (ROUTINE X 2)
Culture: NO GROWTH
Special Requests: ADEQUATE

## 2024-03-04 NOTE — Transitions of Care (Post Inpatient/ED Visit) (Signed)
   03/04/2024  Name: [name] MRN: [id number] DOB: 04-26-1966  Today's TOC FU Call Status: Today's TOC FU Call Status:: Unsuccessful Call (3rd Attempt) Unsuccessful Call (1st Attempt) Date: 03/02/24 Unsuccessful Call (2nd Attempt) Date: 03/03/24 Unsuccessful Call (3rd Attempt) Date: 03/04/24  Attempted to reach the patient regarding the most recent Inpatient/ED visit.  Follow Up Plan: No further outreach attempts will be made at this time. We have been unable to contact the patient.  Signature Darrall Ellison, LPN [facility] Nurse Health Advisor Direct Dial  [phone]

## 2024-03-06 ENCOUNTER — Other Ambulatory Visit (HOSPITAL_COMMUNITY): Payer: Self-pay

## 2024-03-09 ENCOUNTER — Other Ambulatory Visit: Payer: Self-pay

## 2024-03-09 ENCOUNTER — Emergency Department (HOSPITAL_COMMUNITY)

## 2024-03-09 ENCOUNTER — Inpatient Hospital Stay (HOSPITAL_COMMUNITY)
Admission: EM | Admit: 2024-03-09 | Discharge: 2024-03-13 | DRG: 536 | Disposition: A | Discharge status: Home / Self Care | Attending: Internal Medicine | Admitting: Internal Medicine

## 2024-03-09 ENCOUNTER — Encounter (HOSPITAL_COMMUNITY): Payer: Self-pay

## 2024-03-09 DIAGNOSIS — Z8249 Family history of ischemic heart disease and other diseases of the circulatory system: Secondary | ICD-10-CM

## 2024-03-09 DIAGNOSIS — Z808 Family history of malignant neoplasm of other organs or systems: Secondary | ICD-10-CM

## 2024-03-09 DIAGNOSIS — I10 Essential (primary) hypertension: Secondary | ICD-10-CM | POA: Diagnosis present

## 2024-03-09 DIAGNOSIS — E785 Hyperlipidemia, unspecified: Secondary | ICD-10-CM | POA: Diagnosis present

## 2024-03-09 DIAGNOSIS — S32810A Multiple fractures of pelvis with stable disruption of pelvic ring, initial encounter for closed fracture: Principal | ICD-10-CM | POA: Diagnosis present

## 2024-03-09 DIAGNOSIS — F17200 Nicotine dependence, unspecified, uncomplicated: Secondary | ICD-10-CM | POA: Diagnosis present

## 2024-03-09 DIAGNOSIS — E119 Type 2 diabetes mellitus without complications: Secondary | ICD-10-CM | POA: Diagnosis present

## 2024-03-09 DIAGNOSIS — Z794 Long term (current) use of insulin: Secondary | ICD-10-CM

## 2024-03-09 DIAGNOSIS — Y92008 Other place in unspecified non-institutional (private) residence as the place of occurrence of the external cause: Secondary | ICD-10-CM

## 2024-03-09 DIAGNOSIS — Z79899 Other long term (current) drug therapy: Secondary | ICD-10-CM

## 2024-03-09 DIAGNOSIS — R0902 Hypoxemia: Secondary | ICD-10-CM | POA: Diagnosis not present

## 2024-03-09 DIAGNOSIS — F32A Depression, unspecified: Secondary | ICD-10-CM | POA: Diagnosis present

## 2024-03-09 DIAGNOSIS — Z90710 Acquired absence of both cervix and uterus: Secondary | ICD-10-CM

## 2024-03-09 DIAGNOSIS — F419 Anxiety disorder, unspecified: Secondary | ICD-10-CM | POA: Diagnosis present

## 2024-03-09 DIAGNOSIS — R8290 Unspecified abnormal findings in urine: Secondary | ICD-10-CM | POA: Diagnosis present

## 2024-03-09 DIAGNOSIS — S329XXA Fracture of unspecified parts of lumbosacral spine and pelvis, initial encounter for closed fracture: Secondary | ICD-10-CM | POA: Diagnosis present

## 2024-03-09 DIAGNOSIS — Z8551 Personal history of malignant neoplasm of bladder: Secondary | ICD-10-CM

## 2024-03-09 DIAGNOSIS — Z833 Family history of diabetes mellitus: Secondary | ICD-10-CM

## 2024-03-09 DIAGNOSIS — R296 Repeated falls: Secondary | ICD-10-CM | POA: Diagnosis present

## 2024-03-09 DIAGNOSIS — C679 Malignant neoplasm of bladder, unspecified: Secondary | ICD-10-CM | POA: Diagnosis present

## 2024-03-09 DIAGNOSIS — Y900 Blood alcohol level of less than 20 mg/100 ml: Secondary | ICD-10-CM | POA: Diagnosis present

## 2024-03-09 DIAGNOSIS — K219 Gastro-esophageal reflux disease without esophagitis: Secondary | ICD-10-CM | POA: Diagnosis present

## 2024-03-09 DIAGNOSIS — S32401A Unspecified fracture of right acetabulum, initial encounter for closed fracture: Secondary | ICD-10-CM | POA: Diagnosis present

## 2024-03-09 DIAGNOSIS — Z936 Other artificial openings of urinary tract status: Secondary | ICD-10-CM

## 2024-03-09 DIAGNOSIS — F1010 Alcohol abuse, uncomplicated: Secondary | ICD-10-CM | POA: Diagnosis present

## 2024-03-09 DIAGNOSIS — S32059A Unspecified fracture of fifth lumbar vertebra, initial encounter for closed fracture: Secondary | ICD-10-CM | POA: Diagnosis present

## 2024-03-09 DIAGNOSIS — S32009A Unspecified fracture of unspecified lumbar vertebra, initial encounter for closed fracture: Secondary | ICD-10-CM

## 2024-03-09 DIAGNOSIS — S3219XA Other fracture of sacrum, initial encounter for closed fracture: Secondary | ICD-10-CM | POA: Diagnosis present

## 2024-03-09 DIAGNOSIS — W1789XA Other fall from one level to another, initial encounter: Secondary | ICD-10-CM | POA: Diagnosis present

## 2024-03-09 DIAGNOSIS — E871 Hypo-osmolality and hyponatremia: Secondary | ICD-10-CM | POA: Diagnosis not present

## 2024-03-09 LAB — RAPID URINE DRUG SCREEN, HOSP PERFORMED
Amphetamines: NOT DETECTED
Barbiturates: NOT DETECTED
Benzodiazepines: NOT DETECTED
Cocaine: NOT DETECTED
Opiates: NOT DETECTED
Tetrahydrocannabinol: POSITIVE — AB

## 2024-03-09 LAB — COMPREHENSIVE METABOLIC PANEL WITH GFR
ALT: 89 U/L — ABNORMAL HIGH (ref 0–44)
AST: 132 U/L — ABNORMAL HIGH (ref 15–41)
Albumin: 3.5 g/dL (ref 3.5–5.0)
Alkaline Phosphatase: 77 U/L (ref 38–126)
Anion gap: 10 (ref 5–15)
BUN: 10 mg/dL (ref 6–20)
CO2: 22 mmol/L (ref 22–32)
Calcium: 8.5 mg/dL — ABNORMAL LOW (ref 8.9–10.3)
Chloride: 101 mmol/L (ref 98–111)
Creatinine, Ser: 0.57 mg/dL (ref 0.44–1.00)
GFR, Estimated: >60 mL/min (ref >60)
Glucose, Bld: 132 mg/dL — ABNORMAL HIGH (ref 70–99)
Potassium: 3.9 mmol/L (ref 3.5–5.1)
Sodium: 133 mmol/L — ABNORMAL LOW (ref 135–145)
Total Bilirubin: 0.8 mg/dL (ref 0.0–1.2)
Total Protein: 6.7 g/dL (ref 6.5–8.1)

## 2024-03-09 LAB — URINALYSIS, W/ REFLEX TO CULTURE (INFECTION SUSPECTED)
Bilirubin Urine: NEGATIVE
Glucose, UA: NEGATIVE mg/dL
Ketones, ur: NEGATIVE mg/dL
Nitrite: POSITIVE — AB
Protein, ur: 30 mg/dL — AB
Specific Gravity, Urine: 1.013 (ref 1.005–1.030)
pH: 6 (ref 5.0–8.0)

## 2024-03-09 LAB — PROTIME-INR
INR: 1 (ref 0.8–1.2)
Prothrombin Time: 12.9 s (ref 11.4–15.2)

## 2024-03-09 LAB — AMMONIA: Ammonia: 50 umol/L — ABNORMAL HIGH (ref 9–35)

## 2024-03-09 LAB — ETHANOL: Alcohol, Ethyl (B): <15 mg/dL (ref <15)

## 2024-03-09 MED ORDER — LACTATED RINGERS IV BOLUS
1000.0000 mL | Freq: Once | INTRAVENOUS | Status: AC
  Administered 2024-03-09: 1000 mL via INTRAVENOUS

## 2024-03-09 MED ORDER — ONDANSETRON HCL 4 MG/2ML IJ SOLN
4.0000 mg | Freq: Once | INTRAMUSCULAR | Status: AC
  Administered 2024-03-09: 4 mg via INTRAVENOUS
  Filled 2024-03-09: qty 2

## 2024-03-09 MED ORDER — OXYCODONE-ACETAMINOPHEN 5-325 MG PO TABS
1.0000 | ORAL_TABLET | ORAL | Status: DC | PRN
  Administered 2024-03-09: 1 via ORAL
  Filled 2024-03-09: qty 1

## 2024-03-09 MED ORDER — MORPHINE SULFATE (PF) 4 MG/ML IV SOLN
4.0000 mg | Freq: Once | INTRAVENOUS | Status: AC
  Administered 2024-03-09: 4 mg via INTRAVENOUS
  Filled 2024-03-09: qty 1

## 2024-03-09 NOTE — ED Provider Notes (Signed)
 [place] EMERGENCY DEPARTMENT AT [facility] Provider Note   CSN: [id number] Arrival date & time: 03/09/24  1959     History {Add pertinent medical, surgical, social history, OB history to HPI:1} Chief Complaint  Patient presents with   [name] is a 58 y.o. female.  HPI     58 year old female with history of hypertension, hyperlipidemia, anxiety, diabetes, bladder cancer who presents with concern for fall after taking "sleep aid" and drinking 2 glasses of wine last night with headache, back pain and right hip pain.     Last night, fell off of the porch, husband saw it. Doesn't think he knows if she passed out. She doesn't remember how it happened.  Less than 3 feet  Stood up, grabbed a hold of chair to balance self, then ended up going off side and chair fell on her Made it to bed ok  [facility] Friday night too. Walked to the end of the porch and foot went off.  Then found down in the garage last night. Not sure what she did that time ?syncope or mechanical fall?   Got "sleep aid" over the counter sleep medication, seemed to be in a daze Had taken it prior to the fall 2 glasses of wine last night  Having pain in hip and lower back from the fall.  Headache. No neck pain.    No cp, dyspnea Nausea, took nausea medicine No abdominal pain, no diarrhea, no black or bloody stools  No numbness/weakness focally. Hx of numbness feet thinks from dm No change in vision No witnessed seizure Does have some lightheadedness.    Wine here and there, used to every day but not anymore Used to smoke cigarettes, no other drugs, used to use mj  Finished chemo August, surgery October, going every 3 months= [facility]  Past Medical History:  Diagnosis Date   Allergy 07/27/2021   seasonal, environmental   Anxiety    At age of 51   Diabetes (HCC)    GERD (gastroesophageal reflux disease)    Gestational diabetes 10/02/1991   when pregnant with daughter     Hyperlipidemia    Hypertension 10/01/1997   At age of 23    Past Surgical History:  Procedure Laterality Date   ABDOMINAL HYSTERECTOMY  1999    Home Medications Prior to Admission medications   Medication Sig Start Date End Date Taking? Authorizing Provider  amLODipine  (NORVASC ) 10 MG tablet Take 1 tablet (10 mg total) by mouth at bedtime. To lower blood pressure 11/10/21   Fleming, Zelda W, NP  Blood Glucose Monitoring Suppl (TRUE METRIX METER) w/Device KIT 1 each by Does not apply route as needed. 01/04/21   Hassie Lint, PA-C  carvedilol  (COREG ) 12.5 MG tablet Take 1 tablet (12.5 mg total) by mouth 2 (two) times daily with a meal. 05/10/22   Joaquin Mulberry, MD  CLARITIN  10 MG tablet Take 10 mg by mouth daily as needed for allergies or rhinitis.    [provider]  escitalopram  (LEXAPRO ) 20 MG tablet Take 20 mg by mouth daily. 11/20/23   [provider]  fluticasone  (FLONASE ) 50 MCG/ACT nasal spray Place 2 sprays into both nostrils daily. Patient taking differently: Place 1 spray into both nostrils See admin instructions. Instill 1 spray into each nostril one to two times a day 01/10/24   Adra Alanis, FNP  folic acid  (FOLVITE ) 1 MG tablet Take 1 tablet (1 mg total) by  mouth daily. 03/01/24 05/30/24  Hoyt Macleod, MD  gabapentin  (NEURONTIN ) 300 MG capsule Take 2 capsules (600 mg total) by mouth at bedtime. Patient taking differently: Take 300-600 mg by mouth See admin instructions. Take 300 mg by mouth in the morning and at bedtime OR 600 mg at (only) bedtime 01/10/24   Adra Alanis, FNP  glucose blood (TRUE METRIX BLOOD GLUCOSE TEST) test strip Use as directed to test blood sugar 3 (three) times daily. 05/23/22   Newlin, Enobong, MD  hydrOXYzine  (VISTARIL ) 25 MG capsule Take 1 capsule (25 mg total) by mouth every 8 (eight) hours as needed. Patient taking differently: Take 25 mg by mouth every 8 (eight) hours as needed for anxiety. 01/10/24   Adra Alanis, FNP  insulin  glargine, 1 Unit Dial , (TOUJEO  SOLOSTAR) 300 UNIT/ML Solostar Pen Inject 15 Units into the skin in the morning. 06/29/22   [provider]  Insulin  Pen Needle (B-D ULTRAFINE III SHORT PEN) 31G X 8 MM MISC use as directed daily 06/20/21   Fleming, Zelda W, NP  lisinopril  (ZESTRIL ) 40 MG tablet TAKE 1 TABLET (40 MG TOTAL) BY MOUTH DAILY TO LOWER BLOOD PRESSURE 05/23/22   Newlin, Enobong, MD  omeprazole  (PRILOSEC) 20 MG capsule Take 1 capsule (20 mg total) by mouth daily. Patient taking differently: Take 20 mg by mouth daily before breakfast. 05/23/22   Newlin, Enobong, MD  ondansetron  (ZOFRAN ) 8 MG tablet Take 8 mg by mouth every 8 (eight) hours as needed for nausea or vomiting.    [provider]  potassium chloride  SA (KLOR-CON  M) 20 MEQ tablet Take 2 tablets (40 mEq total) by mouth daily for 7 days. 02/29/24 03/07/24  Hoyt Macleod, MD  rosuvastatin  (CRESTOR ) 40 MG tablet Take 1 tablet (40 mg total) by mouth daily. Patient taking differently: Take 40 mg by mouth at bedtime. 12/20/22 02/27/24  Oral Billings, MD  thiamine  (VITAMIN B-1) 100 MG tablet Take 1 tablet (100 mg total) by mouth daily. 03/01/24 05/30/24  Hoyt Macleod, MD  traZODone  (DESYREL ) 50 MG tablet TAKE 1 TO 2 TABLETS BY MOUTH AT BEDTIME AS NEEDED FOR INSOMNIA Patient not taking: Reported on 02/27/2024 05/23/22   Newlin, Enobong, MD  TRUEplus Lancets 28G MISC 1 each by Does not apply route 3 (three) times daily. 01/04/21   Hassie Lint, PA-C  zolpidem  (AMBIEN ) 5 MG tablet Take 1 tablet (5 mg total) by mouth at bedtime as needed for sleep. Patient taking differently: Take 5 mg by mouth at bedtime. 01/31/24   Adra Alanis, FNP      Allergies    Patient has no known allergies.    Review of Systems   Review of Systems  Physical Exam Updated Vital Signs BP (!) 181/86   Pulse 78   Temp 98 F (36.7 C) (Oral)   Resp 18   SpO2 98%  Physical Exam  ED Results / Procedures / Treatments    Labs (all labs ordered are listed, but only abnormal results are displayed) Labs Reviewed - No data to display  EKG None  Radiology No results found.  Procedures Procedures  {Document cardiac monitor, telemetry assessment procedure when appropriate:1}  Medications Ordered in ED Medications  oxyCODONE-acetaminophen  (PERCOCET/ROXICET) 5-325 MG per tablet 1 tablet (1 tablet Oral Given 03/09/24 2015)    ED Course/ Medical Decision Making/ A&P   {   Click here for ABCD2, HEART and other calculatorsREFRESH Note before signing :1}  58 year old female with history of hypertension, hyperlipidemia, anxiety, diabetes, bladder cancer who presents with concern for fall after taking "sleep aid" and drinking 2 glasses of wine last night with headache, back pain and right hip pain.    {Document critical care time when appropriate:1} {Document review of labs and clinical decision tools ie heart score, Chads2Vasc2 etc:1}  {Document your independent review of radiology images, and any outside records:1} {Document your discussion with family members, caretakers, and with consultants:1} {Document social determinants of health affecting pt's care:1} {Document your decision making why or why not admission, treatments were needed:1} Final Clinical Impression(s) / ED Diagnoses Final diagnoses:  None    Rx / DC Orders ED Discharge Orders     None

## 2024-03-09 NOTE — ED Triage Notes (Signed)
 Pt is coming from home after reports of multiple falls, She was found by her husband after she had fallen off the porch which was a 75ft drop off, last night he also had found her on the garage floor. She does drink alcohol, but they have been using an over the counter sleep aid that does seem to be making her very drowsy and unsteady on her feet as well. She has an abrasion to the right eye, lower back pain, and right hip/leg pain. She can move her legs and there is no shortening or rotation of her right leg. She is otherwise stable at this time.  Medic vitals   154/76 90hr 18rr 96%ra 223bgl 98.2  4mg  PO zofran 

## 2024-03-10 DIAGNOSIS — Z90710 Acquired absence of both cervix and uterus: Secondary | ICD-10-CM | POA: Diagnosis not present

## 2024-03-10 DIAGNOSIS — S329XXA Fracture of unspecified parts of lumbosacral spine and pelvis, initial encounter for closed fracture: Secondary | ICD-10-CM | POA: Diagnosis not present

## 2024-03-10 DIAGNOSIS — R296 Repeated falls: Secondary | ICD-10-CM | POA: Diagnosis present

## 2024-03-10 DIAGNOSIS — R8290 Unspecified abnormal findings in urine: Secondary | ICD-10-CM | POA: Diagnosis present

## 2024-03-10 DIAGNOSIS — F32A Depression, unspecified: Secondary | ICD-10-CM | POA: Diagnosis present

## 2024-03-10 DIAGNOSIS — Z79899 Other long term (current) drug therapy: Secondary | ICD-10-CM | POA: Diagnosis not present

## 2024-03-10 DIAGNOSIS — S32401A Unspecified fracture of right acetabulum, initial encounter for closed fracture: Secondary | ICD-10-CM | POA: Diagnosis present

## 2024-03-10 DIAGNOSIS — Y900 Blood alcohol level of less than 20 mg/100 ml: Secondary | ICD-10-CM | POA: Diagnosis present

## 2024-03-10 DIAGNOSIS — Z8551 Personal history of malignant neoplasm of bladder: Secondary | ICD-10-CM | POA: Diagnosis not present

## 2024-03-10 DIAGNOSIS — Z419 Encounter for procedure for purposes other than remedying health state, unspecified: Secondary | ICD-10-CM | POA: Diagnosis not present

## 2024-03-10 DIAGNOSIS — R0902 Hypoxemia: Secondary | ICD-10-CM | POA: Diagnosis not present

## 2024-03-10 DIAGNOSIS — Z936 Other artificial openings of urinary tract status: Secondary | ICD-10-CM | POA: Diagnosis not present

## 2024-03-10 DIAGNOSIS — F419 Anxiety disorder, unspecified: Secondary | ICD-10-CM | POA: Diagnosis present

## 2024-03-10 DIAGNOSIS — I10 Essential (primary) hypertension: Secondary | ICD-10-CM | POA: Diagnosis present

## 2024-03-10 DIAGNOSIS — E785 Hyperlipidemia, unspecified: Secondary | ICD-10-CM | POA: Diagnosis present

## 2024-03-10 DIAGNOSIS — Z794 Long term (current) use of insulin: Secondary | ICD-10-CM | POA: Diagnosis not present

## 2024-03-10 DIAGNOSIS — S32810A Multiple fractures of pelvis with stable disruption of pelvic ring, initial encounter for closed fracture: Secondary | ICD-10-CM | POA: Diagnosis present

## 2024-03-10 DIAGNOSIS — F1010 Alcohol abuse, uncomplicated: Secondary | ICD-10-CM | POA: Diagnosis present

## 2024-03-10 DIAGNOSIS — E871 Hypo-osmolality and hyponatremia: Secondary | ICD-10-CM | POA: Diagnosis not present

## 2024-03-10 DIAGNOSIS — Y92008 Other place in unspecified non-institutional (private) residence as the place of occurrence of the external cause: Secondary | ICD-10-CM | POA: Diagnosis not present

## 2024-03-10 DIAGNOSIS — W1789XA Other fall from one level to another, initial encounter: Secondary | ICD-10-CM | POA: Diagnosis present

## 2024-03-10 DIAGNOSIS — E119 Type 2 diabetes mellitus without complications: Secondary | ICD-10-CM | POA: Diagnosis present

## 2024-03-10 DIAGNOSIS — S3219XA Other fracture of sacrum, initial encounter for closed fracture: Secondary | ICD-10-CM | POA: Diagnosis present

## 2024-03-10 DIAGNOSIS — S32059A Unspecified fracture of fifth lumbar vertebra, initial encounter for closed fracture: Secondary | ICD-10-CM | POA: Diagnosis present

## 2024-03-10 DIAGNOSIS — Z8249 Family history of ischemic heart disease and other diseases of the circulatory system: Secondary | ICD-10-CM | POA: Diagnosis not present

## 2024-03-10 DIAGNOSIS — Z833 Family history of diabetes mellitus: Secondary | ICD-10-CM | POA: Diagnosis not present

## 2024-03-10 DIAGNOSIS — F17200 Nicotine dependence, unspecified, uncomplicated: Secondary | ICD-10-CM | POA: Diagnosis present

## 2024-03-10 DIAGNOSIS — K219 Gastro-esophageal reflux disease without esophagitis: Secondary | ICD-10-CM | POA: Diagnosis present

## 2024-03-10 DIAGNOSIS — Z808 Family history of malignant neoplasm of other organs or systems: Secondary | ICD-10-CM | POA: Diagnosis not present

## 2024-03-10 LAB — CBC WITH DIFFERENTIAL/PLATELET
Abs Immature Granulocytes: 0.09 10*3/uL — ABNORMAL HIGH (ref 0.00–0.07)
Basophils Absolute: 0.1 10*3/uL (ref 0.0–0.1)
Basophils Relative: 1 %
Eosinophils Absolute: 0 10*3/uL (ref 0.0–0.5)
Eosinophils Relative: 0 %
HCT: 37.6 % (ref 36.0–46.0)
Hemoglobin: 12.6 g/dL (ref 12.0–15.0)
Immature Granulocytes: 1 %
Lymphocytes Relative: 13 %
Lymphs Abs: 1.4 10*3/uL (ref 0.7–4.0)
MCH: 34.1 pg — ABNORMAL HIGH (ref 26.0–34.0)
MCHC: 33.5 g/dL (ref 30.0–36.0)
MCV: 101.9 fL — ABNORMAL HIGH (ref 80.0–100.0)
Monocytes Absolute: 0.6 10*3/uL (ref 0.1–1.0)
Monocytes Relative: 6 %
Neutro Abs: 8.5 10*3/uL — ABNORMAL HIGH (ref 1.7–7.7)
Neutrophils Relative %: 79 %
Platelets: 180 10*3/uL (ref 150–400)
RBC: 3.69 MIL/uL — ABNORMAL LOW (ref 3.87–5.11)
RDW: 13.1 % (ref 11.5–15.5)
WBC: 10.6 10*3/uL — ABNORMAL HIGH (ref 4.0–10.5)
nRBC: 0 % (ref 0.0–0.2)

## 2024-03-10 LAB — GLUCOSE, CAPILLARY
Glucose-Capillary: 100 mg/dL — ABNORMAL HIGH (ref 70–99)
Glucose-Capillary: 121 mg/dL — ABNORMAL HIGH (ref 70–99)

## 2024-03-10 LAB — CBG MONITORING, ED
Glucose-Capillary: 151 mg/dL — ABNORMAL HIGH (ref 70–99)
Glucose-Capillary: 140 mg/dL — ABNORMAL HIGH (ref 70–99)

## 2024-03-10 MED ORDER — ESCITALOPRAM OXALATE 20 MG PO TABS
20.0000 mg | ORAL_TABLET | Freq: Every day | ORAL | Status: DC
  Administered 2024-03-10 – 2024-03-13 (×4): 20 mg via ORAL
  Filled 2024-03-10: qty 1
  Filled 2024-03-10: qty 2
  Filled 2024-03-10 (×2): qty 1

## 2024-03-10 MED ORDER — TRAZODONE HCL 50 MG PO TABS: 50.0000 mg | ORAL_TABLET | Freq: Every evening | ORAL | Status: DC | PRN

## 2024-03-10 MED ORDER — GABAPENTIN 300 MG PO CAPS
600.0000 mg | ORAL_CAPSULE | Freq: Every day | ORAL | Status: DC
  Administered 2024-03-10 – 2024-03-12 (×3): 600 mg via ORAL
  Filled 2024-03-10 (×3): qty 2

## 2024-03-10 MED ORDER — SODIUM CHLORIDE 0.9 % IV SOLN
1.0000 g | Freq: Once | INTRAVENOUS | Status: AC
  Administered 2024-03-10: 1 g via INTRAVENOUS
  Filled 2024-03-10: qty 10

## 2024-03-10 MED ORDER — ORAL CARE MOUTH RINSE: 15.0000 mL | OROMUCOSAL | Status: DC | PRN

## 2024-03-10 MED ORDER — INSULIN ASPART 100 UNIT/ML IJ SOLN
0.0000 [IU] | INTRAMUSCULAR | Status: DC
  Administered 2024-03-10: 2 [IU] via SUBCUTANEOUS
  Administered 2024-03-10: 3 [IU] via SUBCUTANEOUS
  Filled 2024-03-10: qty 0.15

## 2024-03-10 MED ORDER — INSULIN ASPART 100 UNIT/ML IJ SOLN: 0.0000 [IU] | Freq: Every day | INTRAMUSCULAR | Status: DC

## 2024-03-10 MED ORDER — ONDANSETRON HCL 4 MG PO TABS
4.0000 mg | ORAL_TABLET | Freq: Four times a day (QID) | ORAL | Status: AC | PRN
Start: 2024-03-10 — End: ?
  Administered 2024-03-10 – 2024-03-11 (×2): 4 mg via ORAL
  Filled 2024-03-10 (×2): qty 1

## 2024-03-10 MED ORDER — PANTOPRAZOLE SODIUM 40 MG PO TBEC
40.0000 mg | DELAYED_RELEASE_TABLET | Freq: Every day | ORAL | Status: DC
  Administered 2024-03-10 – 2024-03-13 (×4): 40 mg via ORAL
  Filled 2024-03-10 (×4): qty 1

## 2024-03-10 MED ORDER — LORAZEPAM 2 MG/ML IJ SOLN
1.0000 mg | INTRAMUSCULAR | Status: AC | PRN
  Administered 2024-03-10: 1 mg via INTRAVENOUS
  Administered 2024-03-11 (×2): 2 mg via INTRAVENOUS
  Filled 2024-03-10 (×3): qty 1

## 2024-03-10 MED ORDER — CARVEDILOL 12.5 MG PO TABS
12.5000 mg | ORAL_TABLET | Freq: Two times a day (BID) | ORAL | Status: DC
  Administered 2024-03-10 – 2024-03-12 (×5): 12.5 mg via ORAL
  Filled 2024-03-10 (×5): qty 1

## 2024-03-10 MED ORDER — CHLORDIAZEPOXIDE HCL 25 MG PO CAPS
25.0000 mg | ORAL_CAPSULE | Freq: Three times a day (TID) | ORAL | Status: AC
  Administered 2024-03-11 (×3): 25 mg via ORAL
  Filled 2024-03-10 (×3): qty 1

## 2024-03-10 MED ORDER — CHLORDIAZEPOXIDE HCL 25 MG PO CAPS
25.0000 mg | ORAL_CAPSULE | Freq: Every day | ORAL | Status: AC
  Administered 2024-03-13: 25 mg via ORAL
  Filled 2024-03-10: qty 1

## 2024-03-10 MED ORDER — CHLORDIAZEPOXIDE HCL 25 MG PO CAPS
25.0000 mg | ORAL_CAPSULE | Freq: Four times a day (QID) | ORAL | Status: AC
Start: 2024-03-10 — End: 2024-03-11
  Administered 2024-03-10 (×3): 25 mg via ORAL
  Filled 2024-03-10 (×3): qty 1

## 2024-03-10 MED ORDER — THIAMINE MONONITRATE 100 MG PO TABS
100.0000 mg | ORAL_TABLET | Freq: Every day | ORAL | Status: DC
  Administered 2024-03-10 – 2024-03-13 (×4): 100 mg via ORAL
  Filled 2024-03-10 (×4): qty 1

## 2024-03-10 MED ORDER — ENOXAPARIN SODIUM 40 MG/0.4ML IJ SOSY
40.0000 mg | PREFILLED_SYRINGE | INTRAMUSCULAR | Status: DC
  Administered 2024-03-10 – 2024-03-13 (×4): 40 mg via SUBCUTANEOUS
  Filled 2024-03-10 (×4): qty 0.4

## 2024-03-10 MED ORDER — TRAMADOL HCL 50 MG PO TABS
50.0000 mg | ORAL_TABLET | Freq: Four times a day (QID) | ORAL | Status: DC | PRN
  Administered 2024-03-10 – 2024-03-13 (×6): 50 mg via ORAL
  Filled 2024-03-10 (×6): qty 1

## 2024-03-10 MED ORDER — ADULT MULTIVITAMIN W/MINERALS CH
1.0000 | ORAL_TABLET | Freq: Every day | ORAL | Status: DC
  Administered 2024-03-10 – 2024-03-13 (×4): 1 via ORAL
  Filled 2024-03-10 (×4): qty 1

## 2024-03-10 MED ORDER — FOLIC ACID 1 MG PO TABS
1.0000 mg | ORAL_TABLET | Freq: Every day | ORAL | Status: DC
  Administered 2024-03-10 – 2024-03-13 (×4): 1 mg via ORAL
  Filled 2024-03-10 (×4): qty 1

## 2024-03-10 MED ORDER — CHLORDIAZEPOXIDE HCL 25 MG PO CAPS
25.0000 mg | ORAL_CAPSULE | ORAL | Status: AC
  Administered 2024-03-12 (×2): 25 mg via ORAL
  Filled 2024-03-10 (×2): qty 1

## 2024-03-10 MED ORDER — INSULIN GLARGINE-YFGN 100 UNIT/ML ~~LOC~~ SOLN
7.0000 [IU] | Freq: Every morning | SUBCUTANEOUS | Status: DC
  Administered 2024-03-10 – 2024-03-13 (×4): 7 [IU] via SUBCUTANEOUS
  Filled 2024-03-10 (×4): qty 0.07

## 2024-03-10 MED ORDER — OXYCODONE HCL 5 MG PO TABS
5.0000 mg | ORAL_TABLET | ORAL | Status: DC | PRN
  Administered 2024-03-10 – 2024-03-13 (×10): 5 mg via ORAL
  Filled 2024-03-10 (×10): qty 1

## 2024-03-10 MED ORDER — SUVOREXANT 20 MG PO TABS: 1.0000 | ORAL_TABLET | Freq: Every day | ORAL | Status: DC

## 2024-03-10 MED ORDER — INSULIN ASPART 100 UNIT/ML IJ SOLN
0.0000 [IU] | Freq: Three times a day (TID) | INTRAMUSCULAR | Status: DC
  Administered 2024-03-11 – 2024-03-13 (×4): 2 [IU] via SUBCUTANEOUS

## 2024-03-10 MED ORDER — THIAMINE HCL 100 MG/ML IJ SOLN: 100.0000 mg | Freq: Every day | INTRAMUSCULAR | Status: DC

## 2024-03-10 MED ORDER — ROSUVASTATIN CALCIUM 20 MG PO TABS
40.0000 mg | ORAL_TABLET | Freq: Every day | ORAL | Status: DC
  Administered 2024-03-10 – 2024-03-12 (×3): 40 mg via ORAL
  Filled 2024-03-10 (×3): qty 2

## 2024-03-10 MED ORDER — ALBUTEROL SULFATE (2.5 MG/3ML) 0.083% IN NEBU: 2.5000 mg | INHALATION_SOLUTION | RESPIRATORY_TRACT | Status: DC | PRN

## 2024-03-10 MED ORDER — SODIUM CHLORIDE 0.9 % IV SOLN
1.0000 g | INTRAVENOUS | Status: DC
  Administered 2024-03-11 – 2024-03-12 (×2): 1 g via INTRAVENOUS
  Filled 2024-03-10 (×2): qty 10

## 2024-03-10 MED ORDER — LISINOPRIL 20 MG PO TABS
40.0000 mg | ORAL_TABLET | Freq: Every day | ORAL | Status: DC
  Administered 2024-03-10 – 2024-03-12 (×3): 40 mg via ORAL
  Filled 2024-03-10: qty 2
  Filled 2024-03-10: qty 4
  Filled 2024-03-10: qty 2

## 2024-03-10 MED ORDER — IBUPROFEN 200 MG PO TABS: 400.0000 mg | ORAL_TABLET | Freq: Four times a day (QID) | ORAL | Status: DC | PRN

## 2024-03-10 MED ORDER — AMLODIPINE BESYLATE 10 MG PO TABS
10.0000 mg | ORAL_TABLET | Freq: Every day | ORAL | Status: DC
  Administered 2024-03-10: 10 mg via ORAL
  Filled 2024-03-10: qty 1

## 2024-03-10 MED ORDER — LORAZEPAM 1 MG PO TABS
1.0000 mg | ORAL_TABLET | ORAL | Status: AC | PRN
  Administered 2024-03-12: 1 mg via ORAL
  Administered 2024-03-12: 2 mg via ORAL
  Administered 2024-03-12 (×2): 1 mg via ORAL
  Filled 2024-03-10: qty 1
  Filled 2024-03-10 (×2): qty 2
  Filled 2024-03-10: qty 1

## 2024-03-10 MED ORDER — LOPERAMIDE HCL 2 MG PO CAPS: 2.0000 mg | ORAL_CAPSULE | ORAL | Status: AC | PRN

## 2024-03-10 MED ORDER — HYDROMORPHONE HCL 1 MG/ML IJ SOLN
0.5000 mg | INTRAMUSCULAR | Status: AC | PRN
  Administered 2024-03-10 (×3): 0.5 mg via INTRAVENOUS
  Filled 2024-03-10 (×3): qty 1

## 2024-03-10 MED ORDER — ONDANSETRON HCL 4 MG/2ML IJ SOLN
4.0000 mg | Freq: Four times a day (QID) | INTRAMUSCULAR | Status: DC | PRN
  Administered 2024-03-10: 4 mg via INTRAVENOUS
  Filled 2024-03-10 (×2): qty 2

## 2024-03-10 NOTE — H&P (Signed)
 History and Physical  [name] UJW:[id number] DOB: Nov 24, 1965 DOA: 03/09/2024  PCP: Adra Alanis, FNP   Chief Complaint: Multiple falls  HPI: [name] is a 58 y.o. female with medical history significant for alcohol abuse, prior alcohol withdrawal seizure, bladder cancer status post ileal conduit being admitted to the hospital with multiple fractures after falls at home.  Apparently she has been continuing to drink, also has been taking an over-the-counter sleep aid which has been making her somnolent.  Apparently she was found by her husband on the ground yesterday evening, after she had fallen off the porch which is about a 3 foot drop.  The night before, he had found her on the floor of the garage.  Currently patient has no specific complaints, she denies any nausea, vomiting, fevers or chills.  States that her last drink of alcohol was the day before she came to the hospital, which would be 6/8.  Review of Systems: Please see HPI for pertinent positives and negatives. A complete 10 system review of systems are otherwise negative.  Past Medical History:  Diagnosis Date   Allergy 07/27/2021   seasonal, environmental   Anxiety    At age of 50   Diabetes [facility])    GERD (gastroesophageal reflux disease)    Gestational diabetes 10/02/1991   when pregnant with daughter    Hyperlipidemia    Hypertension 10/01/1997   At age of 54   Past Surgical History:  Procedure Laterality Date   ABDOMINAL HYSTERECTOMY  1999   Social History:  reports that she quit smoking about 4 years ago. Her smoking use included cigarettes. She started smoking about 42 years ago. She has a 38 pack-year smoking history. She has been exposed to tobacco smoke. She has never used smokeless tobacco. She reports current alcohol use of about 5.0 standard drinks of alcohol per week. She reports that she does not use drugs.  No Known Allergies  Family History  Problem Relation Age of  Onset   Hypertension Mother    Diabetes Father    Colon polyps Sister    Hypertension Sister    Hypertension Brother    Throat cancer Maternal Uncle    Colon cancer Neg Hx    Esophageal cancer Neg Hx    Rectal cancer Neg Hx    Stomach cancer Neg Hx      Prior to Admission medications   Medication Sig Start Date End Date Taking? Authorizing Provider  carvedilol  (COREG ) 12.5 MG tablet Take 1 tablet (12.5 mg total) by mouth 2 (two) times daily with a meal. 05/10/22  Yes Newlin, Enobong, MD  diphenhydrAMINE (BENADRYL) 25 MG tablet Take 25 mg by mouth every 6 (six) hours as needed for allergies.   Yes [provider]  escitalopram  (LEXAPRO ) 20 MG tablet Take 20 mg by mouth daily. 11/20/23  Yes [provider]  folic acid  (FOLVITE ) 1 MG tablet Take 1 tablet (1 mg total) by mouth daily. 03/01/24 05/30/24 Yes Dahal, Aminta Baldy, MD  gabapentin  (NEURONTIN ) 300 MG capsule Take 2 capsules (600 mg total) by mouth at bedtime. 01/10/24  Yes Adra Alanis, FNP  hydrOXYzine  (VISTARIL ) 25 MG capsule Take 1 capsule (25 mg total) by mouth every 8 (eight) hours as needed. Patient taking differently: Take 25 mg by mouth every 8 (eight) hours as needed for anxiety. 01/10/24  Yes Adra Alanis, FNP  lisinopril  (ZESTRIL ) 40 MG tablet TAKE 1 TABLET (40 MG TOTAL) BY MOUTH DAILY TO LOWER BLOOD  PRESSURE 05/23/22  Yes Newlin, Enobong, MD  ondansetron  (ZOFRAN ) 8 MG tablet Take 8 mg by mouth every 8 (eight) hours as needed for nausea or vomiting.   Yes [provider]  rosuvastatin  (CRESTOR ) 40 MG tablet Take 40 mg by mouth daily.   Yes [provider]  Suvorexant  (BELSOMRA ) 20 MG TABS Take 1 tablet by mouth at bedtime.   Yes [provider]  vitamin B-12 (CYANOCOBALAMIN) 100 MCG tablet Take 100 mcg by mouth daily.   Yes [provider]  amLODipine  (NORVASC ) 10 MG tablet Take 1 tablet (10 mg total) by mouth at bedtime. To lower blood pressure 11/10/21   Fleming,  Zelda W, NP  Blood Glucose Monitoring Suppl (TRUE METRIX METER) w/Device KIT 1 each by Does not apply route as needed. 01/04/21   Hassie Lint, PA-C  fluticasone  (FLONASE ) 50 MCG/ACT nasal spray Place 2 sprays into both nostrils daily. 01/10/24   Adra Alanis, FNP  glucose blood (TRUE METRIX BLOOD GLUCOSE TEST) test strip Use as directed to test blood sugar 3 (three) times daily. 05/23/22   Newlin, Enobong, MD  insulin  glargine, 1 Unit Dial , (TOUJEO  SOLOSTAR) 300 UNIT/ML Solostar Pen Inject 15 Units into the skin in the morning. 06/29/22   [provider]  Insulin  Pen Needle (B-D ULTRAFINE III SHORT PEN) 31G X 8 MM MISC use as directed daily 06/20/21   Fleming, Zelda W, NP  omeprazole  (PRILOSEC) 20 MG capsule Take 1 capsule (20 mg total) by mouth daily. Patient taking differently: Take 20 mg by mouth daily before breakfast. 05/23/22   Newlin, Enobong, MD  potassium chloride  SA (KLOR-CON  M) 20 MEQ tablet Take 2 tablets (40 mEq total) by mouth daily for 7 days. 02/29/24 03/07/24  Hoyt Macleod, MD  rosuvastatin  (CRESTOR ) 40 MG tablet Take 1 tablet (40 mg total) by mouth daily. Patient taking differently: Take 40 mg by mouth at bedtime. 12/20/22 02/27/24  Oral Billings, MD  thiamine  (VITAMIN B-1) 100 MG tablet Take 1 tablet (100 mg total) by mouth daily. 03/01/24 05/30/24  Hoyt Macleod, MD  traZODone  (DESYREL ) 50 MG tablet TAKE 1 TO 2 TABLETS BY MOUTH AT BEDTIME AS NEEDED FOR INSOMNIA Patient not taking: Reported on 02/27/2024 05/23/22   Newlin, Enobong, MD  TRUEplus Lancets 28G MISC 1 each by Does not apply route 3 (three) times daily. 01/04/21   Hassie Lint, PA-C  zolpidem  (AMBIEN ) 5 MG tablet Take 1 tablet (5 mg total) by mouth at bedtime as needed for sleep. Patient not taking: Reported on 03/10/2024 01/31/24   Adra Alanis, FNP    Physical Exam: BP (!) 184/86   Pulse 75   Temp 98 F (36.7 C)   Resp 19   Ht 5\' 2"  (1.575 m)   Wt 51 kg   SpO2 97%   BMI 20.56 kg/m   General:  Alert, oriented, calm, in no acute distress, resting comfortably looks chronically ill Eyes: EOMI, clear conjuctivae, white sclerea, abrasion above right eye Neck: supple, no masses, trachea mildline  Cardiovascular: RRR, no murmurs or rubs, no peripheral edema  Respiratory: clear to auscultation bilaterally, no wheezes, no crackles  Abdomen: soft, nontender, nondistended, normal bowel tones heard  Skin: dry, no rashes  Musculoskeletal: no joint effusions, lower extremity range of motion not checked due to known fractures Psychiatric: appropriate affect, normal speech  Neurologic: extraocular muscles intact, clear speech, moving all extremities with intact sensorium         Labs on Admission:  Basic Metabolic Panel: Recent Labs  Lab 03/09/24 2212  NA 133*  K 3.9  CL 101  CO2 22  GLUCOSE 132*  BUN 10  CREATININE 0.57  CALCIUM  8.5*   Liver Function Tests: Recent Labs  Lab 03/09/24 2212  AST 132*  ALT 89*  ALKPHOS 77  BILITOT 0.8  PROT 6.7  ALBUMIN 3.5   No results for input(s): "LIPASE", "AMYLASE" in the last 168 hours. Recent Labs  Lab 03/09/24 2214  AMMONIA 50*   CBC: Recent Labs  Lab 03/09/24 0206  WBC 10.6*  NEUTROABS 8.5*  HGB 12.6  HCT 37.6  MCV 101.9*  PLT 180   Cardiac Enzymes: No results for input(s): "CKTOTAL", "CKMB", "CKMBINDEX", "TROPONINI" in the last 168 hours. BNP (last 3 results) No results for input(s): "BNP" in the last 8760 hours.  ProBNP (last 3 results) No results for input(s): "PROBNP" in the last 8760 hours.  CBG: No results for input(s): "GLUCAP" in the last 168 hours.  Radiological Exams on Admission: CT PELVIS WO CONTRAST Result Date: 03/10/2024 CLINICAL DATA:  Hip trauma, fracture suspected, multiple falls. Found by husband after falling off the porch. Also fell on the ground floor last night. Right hip and leg pain. EXAM: CT OF THE RIGHT HIP WITHOUT CONTRAST CT PELVIS WITHOUT CONTRAST TECHNIQUE: Multidetector CT  imaging of the pelvis was performed following the standard protocol without intravenous contrast. Multidetector CT imaging of the right hip was performed according to the standard protocol. Multiplanar CT image reconstructions were also generated. RADIATION DOSE REDUCTION: This exam was performed according to the departmental dose-optimization program which includes automated exposure control, adjustment of the mA and/or kV according to patient size and/or use of iterative reconstruction technique. COMPARISON:  None Available. FINDINGS: Urinary Tract: Postoperative change of cystectomy with ileal conduit in lower quadrant. Bowel:  Colonic diverticulosis without diverticulitis. Vascular/Lymphatic: Arterial atherosclerotic calcification. No lymphadenopathy. Reproductive:  Hysterectomy. Bones/Joint/Cartilage Acute minimally displaced fracture of the anterior right acetabulum. The fracture line extends into the joint space. Buckle fracture of the superior pubic ramus with fracture line extending into the pubic body and pubic symphysis. Additional nondisplaced fracture of the right inferior pubic ramus. Comminuted minimally displaced right sacral ala fracture next Ligaments Suboptimally assessed by CT. Muscles and Tendons Grossly intact. Soft tissues Unremarkable. IMPRESSION: 1. Acute comminuted minimally displaced intra-articular fracture of the anterior right acetabulum. 2. Buckle fracture of the superior pubic ramus with fracture line extending into the pubic body and pubic symphysis. Additional nondisplaced fracture of the right inferior pubic ramus. 3. Comminuted minimally displaced right sacral ala fracture. Electronically Signed   By: Rozell Cornet M.D.   On: 03/10/2024 00:06   CT Hip Right Wo Contrast Result Date: 03/10/2024 CLINICAL DATA:  Hip trauma, fracture suspected, multiple falls. Found by husband after falling off the porch. Also fell on the ground floor last night. Right hip and leg pain. EXAM: CT  OF THE RIGHT HIP WITHOUT CONTRAST CT PELVIS WITHOUT CONTRAST TECHNIQUE: Multidetector CT imaging of the pelvis was performed following the standard protocol without intravenous contrast. Multidetector CT imaging of the right hip was performed according to the standard protocol. Multiplanar CT image reconstructions were also generated. RADIATION DOSE REDUCTION: This exam was performed according to the departmental dose-optimization program which includes automated exposure control, adjustment of the mA and/or kV according to patient size and/or use of iterative reconstruction technique. COMPARISON:  None Available. FINDINGS: Urinary Tract: Postoperative change of cystectomy with ileal conduit in lower quadrant. Bowel:  Colonic  diverticulosis without diverticulitis. Vascular/Lymphatic: Arterial atherosclerotic calcification. No lymphadenopathy. Reproductive:  Hysterectomy. Bones/Joint/Cartilage Acute minimally displaced fracture of the anterior right acetabulum. The fracture line extends into the joint space. Buckle fracture of the superior pubic ramus with fracture line extending into the pubic body and pubic symphysis. Additional nondisplaced fracture of the right inferior pubic ramus. Comminuted minimally displaced right sacral ala fracture next Ligaments Suboptimally assessed by CT. Muscles and Tendons Grossly intact. Soft tissues Unremarkable. IMPRESSION: 1. Acute comminuted minimally displaced intra-articular fracture of the anterior right acetabulum. 2. Buckle fracture of the superior pubic ramus with fracture line extending into the pubic body and pubic symphysis. Additional nondisplaced fracture of the right inferior pubic ramus. 3. Comminuted minimally displaced right sacral ala fracture. Electronically Signed   By: Rozell Cornet M.D.   On: 03/10/2024 00:06   CT Lumbar Spine Wo Contrast Result Date: 03/10/2024 CLINICAL DATA:  Low back pain, trauma EXAM: CT LUMBAR SPINE WITHOUT CONTRAST TECHNIQUE:  Multidetector CT imaging of the lumbar spine was performed without intravenous contrast administration. Multiplanar CT image reconstructions were also generated. RADIATION DOSE REDUCTION: This exam was performed according to the departmental dose-optimization program which includes automated exposure control, adjustment of the mA and/or kV according to patient size and/or use of iterative reconstruction technique. COMPARISON:  CT abdomen pelvis 01/24/2023, CT abdomen pelvis 11/28/2023 FINDINGS: Segmentation: 5 lumbar type vertebrae. Alignment: Normal. Vertebrae: Acute nondisplaced fracture of the right L5 transverse process. Old healed right L4 transverse process fracture. Paraspinal and other soft tissues: Negative. Disc levels: Maintained. Other: Atherosclerotic plaque. Partially visualized acute displaced and comminuted fracture of the right sacral ala. Question acute nondisplaced fracture of the right iliac bone (6:57). IMPRESSION: 1. Acute nondisplaced fracture of the right L5 transverse process. 2. Partially visualized acute displaced and comminuted fracture of the right sacral ala. 3. Question acute nondisplaced fracture of the right iliac bone. 4. Please see separately dictated right hip and pelvis CT 03/09/2024. 5.  Aortic Atherosclerosis (ICD10-I70.0). Electronically Signed   By: Morgane  Naveau M.D.   On: 03/10/2024 00:05   CT Head Wo Contrast Result Date: 03/10/2024 CLINICAL DATA:  Head trauma, minor (Age >= 65y).  Trauma EXAM: CT HEAD WITHOUT CONTRAST TECHNIQUE: Contiguous axial images were obtained from the base of the skull through the vertex without intravenous contrast. RADIATION DOSE REDUCTION: This exam was performed according to the departmental dose-optimization program which includes automated exposure control, adjustment of the mA and/or kV according to patient size and/or use of iterative reconstruction technique. COMPARISON:  CT head 02/27/2024 FINDINGS: Brain: Cerebral ventricle sizes  are concordant with the degree of cerebral volume loss. No evidence of large-territorial acute infarction. No parenchymal hemorrhage. No mass lesion. No extra-axial collection. No mass effect or midline shift. No hydrocephalus. Basilar cisterns are patent. Vascular: No hyperdense vessel. Atherosclerotic calcifications are present within the cavernous internal carotid arteries. Skull: No acute fracture or focal lesion. Sinuses/Orbits: Left sphenoid sinus mucosal thickening. Paranasal sinuses and mastoid air cells are clear. The orbits are unremarkable. Other: Trace right periorbital subcutaneus soft tissue hematoma. IMPRESSION: No acute intracranial abnormality. Electronically Signed   By: Morgane  Naveau M.D.   On: 03/10/2024 00:01   DG Hip Unilat  With Pelvis 2-3 Views Right Result Date: 03/09/2024 CLINICAL DATA:  Recent fall with right hip pain, initial encounter EXAM: DG HIP (WITH OR WITHOUT PELVIS) 3V RIGHT COMPARISON:  None Available. FINDINGS: Pelvic ring is intact. No acute fracture or dislocation is noted. No soft tissue abnormality is seen. IMPRESSION:  No acute abnormality noted. Electronically Signed   By: Violeta Grey M.D.   On: 03/09/2024 21:37   Assessment/Plan [name] is a 58 y.o. female with medical history significant for alcohol abuse, prior alcohol withdrawal seizure, bladder cancer status post ileal conduit being admitted to the hospital with multiple fractures after falls at home.   Falls with multiple fractures-falls seem to likely be due to alcohol use in the setting of belsomra , family states that she has been somnolent and unbalanced since taking this medication.  She has L5 transverse process nondisplaced fracture, as well as acute displaced and comminuted fracture of the right sacral ala possibly fracture involving the acetabulum, and acute nondisplaced fracture of the right iliac bone. -Inpatient admission -Pain control as needed -ER has consulted orthopedics Dr.  Bernard Brick, patient may need surgical intervention.  He is discussing with Ortho trauma team, transfer to [facility] may be indicated.  Alcohol abuse-with history of alcohol withdrawal seizure.  She states that her last drink of alcohol was 6/8.  Currently she is hypertensive but otherwise not showing objective signs of alcohol withdrawal. -Librium taper -Thiamine , folate, multivitamin -P.o./IV Ativan  per CIWA protocol  Abnormal urinalysis-taken from ileal conduit which obviously could be colonized.  However given the patient's acute ambulatory dysfunction will need to rule out acute infection. 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 until surgical plans are elucidated, carb modified diet when cleared to eat -Will plan for moderate dose sliding scale insulin   Hyperlipidemia-Crestor   Hypertension-continue home amlodipine , lisinopril , Coreg   GERD-omeprazole   Depression-Lexapro   DVT prophylaxis: Lovenox     Code Status: Full Code  Consults called: None  Admission status: The appropriate patient status for this patient is INPATIENT. Inpatient status is judged to be reasonable and necessary in order to provide the required intensity of service to ensure the patient's safety. The patient's presenting symptoms, physical exam findings, and initial radiographic and laboratory data in the context of their chronic comorbidities is felt to place them at high risk for further clinical deterioration. Furthermore, it is not anticipated that the patient will be medically stable for discharge from the hospital within 2 midnights of admission.    I certify that at the point of admission it is my clinical judgment that the patient will require inpatient hospital care spanning beyond 2 midnights from the point of admission due to high intensity of service, high risk for further deterioration and high frequency of  surveillance required  Time spent: 56 minutes  [name] Charm MD [facility] Pager [phone]  If 7PM-7AM, please contact night-coverage [email or web address] Password [facility]  03/10/2024, 7:48 AM

## 2024-03-10 NOTE — ED Notes (Signed)
 Introduced self to pt after shift report. Night shift reported pt was c/o pain/nausea and was asking for something for same. She called out shortly after shift change asking. I told her she did not yet have an assigned provider, so as soon as one was assigned, I would ask. Pt was also c/o being hot, so a bedside fan was provided.

## 2024-03-10 NOTE — ED Provider Notes (Signed)
 I assumed care of this patient from previous provider.  Please see their note for further details of history, exam, and MDM.   Briefly patient is a 58 y.o. female who presented for hip pain following numerous falls.  Currently pending imaging and labs.  UA was collected from ileostomy and may be contaminated from intestinal bacteria but nitrites are positive which were not on the prior.  Started on empiric antibiotics.  Culture sent.  Imaging was notable for numerous pelvic fractures of the sacral ala, pubic rami and acetabular with intra-articular extension.  Transverse process fracture of L5 fracture noted as well.  CT head negative.  Consulted and spoke with Dr. Bernard Brick from orthopedic surgery.  He recommended medicine admission at [facility] for evaluation by the trauma orthopedic team.  Patient admitted to medicine      Nicole Defino, Camila Cecil, MD 03/10/24 [phone]

## 2024-03-10 NOTE — Progress Notes (Signed)
 Patient ID: [name], female   DOB: 1966/04/07, 58 y.o.   MRN: [id number]  Consult received regarding her pelvic injuries. I have asked our orthopedic trauma surgeons to evaluate the CT scan to determine whether or not operative indications exist. Initially I had asked that she be transferred to [facility] for potential surgical intervention based on the reports of an acetabular fracture.  My review of the CT scans indicate that perhaps nonoperative management may be indicated and if that is the case transfer may not be necessary unless it is already set up.  Once I hear back from Dr. Kevin Haddix I will complete a consult note to help further direct care.

## 2024-03-10 NOTE — Progress Notes (Signed)
 Patient ID: [name], female   DOB: [birth year], 58 y.o.   MRN: [id number]  Dr. Curtiss Dowdy reviewed the imaging of her pelvis and felt that her injuries could be treated nonoperatively.  Full note will follow however I wanted to make this known as transfer to [facility] would not be mandatory and the patient could remain at [place] long if bed was available.  Again she will be treated nonoperatively, weightbearing as tolerated.

## 2024-03-11 DIAGNOSIS — S32401A Unspecified fracture of right acetabulum, initial encounter for closed fracture: Secondary | ICD-10-CM | POA: Diagnosis not present

## 2024-03-11 LAB — BASIC METABOLIC PANEL WITH GFR
Anion gap: 9 (ref 5–15)
BUN: 15 mg/dL (ref 6–20)
CO2: 25 mmol/L (ref 22–32)
Calcium: 8.3 mg/dL — ABNORMAL LOW (ref 8.9–10.3)
Chloride: 98 mmol/L (ref 98–111)
Creatinine, Ser: 0.43 mg/dL — ABNORMAL LOW (ref 0.44–1.00)
GFR, Estimated: >60 mL/min (ref >60)
Glucose, Bld: 95 mg/dL (ref 70–99)
Potassium: 3.6 mmol/L (ref 3.5–5.1)
Sodium: 132 mmol/L — ABNORMAL LOW (ref 135–145)

## 2024-03-11 LAB — CBC
HCT: 38.5 % (ref 36.0–46.0)
Hemoglobin: 12.2 g/dL (ref 12.0–15.0)
MCH: 33.4 pg (ref 26.0–34.0)
MCHC: 31.7 g/dL (ref 30.0–36.0)
MCV: 105.5 fL — ABNORMAL HIGH (ref 80.0–100.0)
Platelets: 161 10*3/uL (ref 150–400)
RBC: 3.65 MIL/uL — ABNORMAL LOW (ref 3.87–5.11)
RDW: 13.1 % (ref 11.5–15.5)
WBC: 6.9 10*3/uL (ref 4.0–10.5)
nRBC: 0 % (ref 0.0–0.2)

## 2024-03-11 LAB — GLUCOSE, CAPILLARY
Glucose-Capillary: 100 mg/dL — ABNORMAL HIGH (ref 70–99)
Glucose-Capillary: 140 mg/dL — ABNORMAL HIGH (ref 70–99)
Glucose-Capillary: 110 mg/dL — ABNORMAL HIGH (ref 70–99)
Glucose-Capillary: 160 mg/dL — ABNORMAL HIGH (ref 70–99)

## 2024-03-11 MED ORDER — SODIUM CHLORIDE 0.9 % IV SOLN: INTRAVENOUS | Status: AC | PRN

## 2024-03-11 MED ORDER — VITAMIN B-12 100 MCG PO TABS
100.0000 ug | ORAL_TABLET | Freq: Every day | ORAL | Status: DC
  Administered 2024-03-11 – 2024-03-13 (×3): 100 ug via ORAL
  Filled 2024-03-11 (×3): qty 1

## 2024-03-11 MED ORDER — HYDROXYZINE PAMOATE 25 MG PO CAPS: 25.0000 mg | ORAL_CAPSULE | Freq: Three times a day (TID) | ORAL | Status: DC | PRN

## 2024-03-11 NOTE — Plan of Care (Signed)

## 2024-03-11 NOTE — Evaluation (Signed)
 Physical Therapy Evaluation Patient Details Name: [name] MRN: [id number] DOB: [birth year] Today's Date: 03/11/2024  History of Present Illness  Patient is a 58 year old female who presented after falls at home. Patient was found to have multiple fractures nondisplaced involving right hip and extends into the joint anteriorly at the acetabulum, sacral involvement. PMH; alcohol abuse, alcohol withdrawal seizure, IDDM II, hyperlipidemia, HTN,  Clinical Impression  On eval, pt required Min A +2 for safety/equipment. She walked ~7 feet with a RW. O2 89% on RA-placed [place] O2 back on after activity. Moderate pain with activity. Pt presents with general weakness, decreased activity tolerance, and impaired gait and balance. At times, she has poor insight regarding current condition and mobility status. Pt reports she lives with her husband. She is inquiring if she would be able to go to [facility] by Sunday (for tubing, camping, etc). Cautioned pt against this for multiple reasons including safety, risk for falls, risk for further injury to areas with fractures, pain control concerns, length of car ride, ability to mobilize/navigate stairs/steps,etc). No family present during session. At this time, feel pt could benefit from continued inpatient follow up therapy, <3 hours/day, if agreeable. If she is not agreeable, recommend HHPT and 24/7 supervision and assist if she returns home. Will continue to follow during this hospital stay.       If plan is discharge home, recommend the following: A little help with walking and/or transfers;A little help with bathing/dressing/bathroom;Assistance with cooking/housework;Assist for transportation;Help with stairs or ramp for entrance   Can travel by private vehicle        Equipment Recommendations Rolling Walker  Recommendations for Other Services       Functional Status Assessment Patient has had a recent decline in their functional status and  demonstrates the ability to make significant improvements in function in a reasonable and predictable amount of time.     Precautions / Restrictions Precautions Precautions: Fall Precaution/Restrictions Comments: monitor O2 Restrictions RLE Weight Bearing Per Provider Order: Weight bearing as tolerated      Mobility  Bed Mobility Overal bed mobility: Needs Assistance Bed Mobility: Supine to Sit     Supine to sit: HOB elevated, Used rails, Contact guard     General bed mobility comments: Cues for safety, technique. CGA for safety, lines.    Transfers Overall transfer level: Needs assistance Equipment used: Rolling walker (2 wheels) Transfers: Sit to/from Stand Sit to Stand: Min assist, +2 safety/equipment           General transfer comment: Cues for safety, technique, hand placement. Increased time. Pt reported mild dizziness.    Ambulation/Gait Ambulation/Gait assistance: Min assist, +2 safety/equipment Gait Distance (Feet): 7 Feet Assistive device: Rolling walker (2 wheels) Gait Pattern/deviations: Step-to pattern       General Gait Details: Cues for safety, sequencing, proper use of RW. Pt reported mild dizziness. Tolerated short distance well. Followed with recliner and transported pt back to bedside. O2 89% on RA  Stairs            Wheelchair Mobility     Tilt Bed    Modified Rankin (Stroke Patients Only)       Balance Overall balance assessment: Needs assistance, History of Falls         Standing balance support: Reliant on assistive device for balance, During functional activity, Bilateral upper extremity supported Standing balance-Leahy Scale: Poor  Pertinent Vitals/Pain Pain Assessment Pain Assessment: Faces Faces Pain Scale: Hurts even more Pain Location: R hip Pain Descriptors / Indicators: Grimacing, Constant Pain Intervention(s): Limited activity within patient's tolerance, Monitored  during session, Repositioned    Home Living Family/patient expects to be discharged to:: Private residence Living Arrangements: Spouse/significant other Available Help at Discharge: Family Type of Home: House Home Access: Stairs to enter Entrance Stairs-Rails: Doctor, general practice of Steps: 5 (front); 2 (back deck)   Home Layout: One level Home Equipment: Cane - single point;Shower seat      Prior Function Prior Level of Function : Independent/Modified Independent             Mobility Comments: 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       Extremity/Trunk Assessment   Upper Extremity Assessment Upper Extremity Assessment: Defer to OT evaluation    Lower Extremity Assessment Lower Extremity Assessment: Generalized weakness    Cervical / Trunk Assessment Cervical / Trunk Assessment: Normal  Communication   Communication Communication: No apparent difficulties    Cognition Arousal: Alert Behavior During Therapy: WFL for tasks assessed/performed                           PT - Cognition Comments: poor insight into current condition at times (for example: pt asking if she can go tubing while visiting [employer] this coming weekend) Following commands: Intact       Cueing Cueing Techniques: Verbal cues     General Comments      Exercises     Assessment/Plan    PT Assessment Patient needs continued PT services  PT Problem List Decreased strength;Decreased range of motion;Decreased activity tolerance;Decreased balance;Decreased mobility;Decreased knowledge of use of DME       PT Treatment Interventions DME instruction;Gait training;Functional mobility training;Therapeutic activities;Therapeutic exercise;Patient/family education;Balance training    PT Goals (Current goals can be found in the Care Plan section)  Acute Rehab PT Goals Patient Stated Goal: to go on vacation this weekend PT Goal Formulation: With  patient Time For Goal Achievement: 03/25/24 Potential to Achieve Goals: Good    Frequency Min 4X/week     Co-evaluation               AM-PAC PT 6 Clicks Mobility  Outcome Measure Help needed turning from your back to your side while in a flat bed without using bedrails?: A Little Help needed moving from lying on your back to sitting on the side of a flat bed without using bedrails?: A Little Help needed moving to and from a bed to a chair (including a wheelchair)?: A Little Help needed standing up from a chair using your arms (e.g., wheelchair or bedside chair)?: A Little Help needed to walk in hospital room?: A Little Help needed climbing 3-5 steps with a railing? : A Lot 6 Click Score: 17    End of Session Equipment Utilized During Treatment: Gait belt Activity Tolerance: Patient tolerated treatment well Patient left: in chair;with call bell/phone within reach   PT Visit Diagnosis: Difficulty in walking, not elsewhere classified (R26.2);History of falling (Z91.81);Muscle weakness (generalized) (M62.81)    Time: 1610-9604 PT Time Calculation (min) (ACUTE ONLY): 28 min   Charges:   PT Evaluation $PT Eval Low Complexity: 1 Low   PT General Charges $$ ACUTE PT VISIT: 1 Visit           Tanda Falter, PT Acute Rehabilitation  Office: [phone]

## 2024-03-11 NOTE — Progress Notes (Signed)
 PROGRESS NOTE [name]  ZOX:[id number] DOB: Dec 16, 1965 DOA: 03/09/2024 PCP: Adra Alanis, FNP  Brief Narrative/Hospital Course: 58 y.o. female with medical history significant for alcohol abuse, prior alcohol withdrawal seizure, bladder cancer status post ileal conduit being admitted to the hospital with multiple fractures after falls at home.     Subjective: Patient seen and examined this morning some slight pain felt better after using pillow and able to sleep She is worried about her upcoming vacation on Sunday in [place] Overnight BP stable afebrile on nasal cannula oxygen Labs reviewed mild hyponatremia otherwise stable  Assessment and plan:  Multiple fractures, nondisplaced involving right hip and extends into the joint anteriorly at the acetabulum, sacral involvement: Seen by Dr. Bernard Brick and after discussed with Dr. Curtiss Dowdy plan is for nonoperative treatment weightbearing as tolerated without restriction walker for gait assistance, pain management PT OT and follow-up with Dr. Curtiss Dowdy in 3 to 4 weeks for clinical neurological follow-up.  Alcohol abuse: History of alcohol withdrawal seizure: last drink  was on 6/8.  On presentation hypertensive but no other signs of withdrawal Continue Librium taper thiamine  folate multivitamin along with CIWA protocol UDS+ for THC   Abnormal urinalysis from ileal conduit: ?: colonization, follow-up urine culture, on ceftriaxone  until date   IDDM T2: well-controlled at baseline, Cont ssi   Hypertension: Stable, continue Coreg , lisinopril   Hyperlipidemia: Cont crestor    Hypertension: Stable, continue home amlodipine , lisinopril , Coreg    GERD: Con omeprazole    Depression: Cont exapro   DVT prophylaxis: enoxaparin (LOVENOX) injection 40 mg Start: 03/10/24 0800 Code Status:   Code Status: Full Code Family Communication: plan of care discussed with patient at bedside. Patient status is: Remains hospitalized because  of severity of illness Level of care: Progressive   Dispo: The patient is from: home            Anticipated disposition: TBD Objective: Vitals last 24 hrs: Vitals:   03/10/24 2001 03/10/24 2141 03/10/24 2333 03/11/24 0354  BP: 117/74 139/77 133/80 113/72  Pulse: 77 76 72 74  Resp: 14  14 13   Temp: 97.9 F (36.6 C)  98.1 F (36.7 C) 98.1 F (36.7 C)  TempSrc: Oral  Oral   SpO2: 96%  92% 97%  Weight:      Height:        Physical Examination: General exam: alert awake, older than stated age HEENT:Oral mucosa moist, Ear/Nose WNL grossly Respiratory system: Bilaterally clear BS, no use of accessory muscle Cardiovascular system: S1 & S2 +. Gastrointestinal system: Abdomen soft, ileostomy in place NT,ND,BS+ Nervous System: Alert, awake, following commands. Extremities: LE edema neg, warm extremities Skin: No rashes,warm. MSK: Normal muscle bulk/tone.   Data Reviewed: I have personally reviewed following labs and imaging studies ( see epic result tab) CBC: Recent Labs  Lab 03/09/24 0206 03/11/24 0413  WBC 10.6* 6.9  NEUTROABS 8.5*  --   HGB 12.6 12.2  HCT 37.6 38.5  MCV 101.9* 105.5*  PLT 180 161   CMP: Recent Labs  Lab 03/09/24 2212 03/11/24 0413  NA 133* 132*  K 3.9 3.6  CL 101 98  CO2 22 25  GLUCOSE 132* 95  BUN 10 15  CREATININE 0.57 0.43*  CALCIUM  8.5* 8.3*   GFR: Estimated Creatinine Clearance: 60.6 mL/min (A) (by C-G formula based on SCr of 0.43 mg/dL (L)). Recent Labs  Lab 03/09/24 2212  AST 132*  ALT 89*  ALKPHOS 77  BILITOT 0.8  PROT 6.7  ALBUMIN 3.5   No  results for input(s): LIPASE, AMYLASE in the last 168 hours.  Recent Labs  Lab 03/09/24 2214  AMMONIA 50*   Coagulation Profile:  Recent Labs  Lab 03/09/24 2212  INR 1.0   Unresulted Labs (From admission, onward)     Start     Ordered   03/12/24 0500  Basic metabolic panel with GFR  Tomorrow morning,   R       Question:  Specimen collection method  Answer:  Lab=Lab collect    03/11/24 0805   03/12/24 0500  CBC  Tomorrow morning,   R       Question:  Specimen collection method  Answer:  Lab=Lab collect   03/11/24 0805   03/11/24 1140  Glucose, capillary  Once,   R        03/11/24 1140   03/09/24 2107  CBC with Differential  Once,   STAT        03/09/24 2107           Antimicrobials/Microbiology: Anti-infectives (From admission, onward)    Start     Dose/Rate Route Frequency Ordered Stop   03/11/24 0600  cefTRIAXone  (ROCEPHIN ) 1 g in sodium chloride  0.9 % 100 mL IVPB        1 g 200 mL/hr over 30 Minutes Intravenous Every 24 hours 03/10/24 0752     03/10/24 0345  cefTRIAXone  (ROCEPHIN ) 1 g in sodium chloride  0.9 % 100 mL IVPB        1 g 200 mL/hr over 30 Minutes Intravenous  Once 03/10/24 0332 03/10/24 0448         Component Value Date/Time   SDES  03/09/2024 2220    URINE, RANDOM Performed at [facility], [address] [place], [place] [address]    SPECREQUEST  03/09/2024 2220    NONE Reflexed from [id number] Performed at [facility], [address], [place], [place] [address]    CULT (A) 03/09/2024 2220    >=100,000 COLONIES/mL GRAM NEGATIVE RODS SUSCEPTIBILITIES TO FOLLOW Performed at [facility], [address], [place], [place] [address]    REPTSTATUS PENDING 03/09/2024 2220    Procedures:  Medications reviewed:  Scheduled Meds:  amLODipine   10 mg Oral QHS   carvedilol   12.5 mg Oral BID WC   chlordiazePOXIDE  25 mg Oral TID   Followed by   Cecily Cohen ON 03/12/2024] chlordiazePOXIDE  25 mg Oral BH-qamhs   Followed by   Cecily Cohen ON 03/13/2024] chlordiazePOXIDE  25 mg Oral Daily   enoxaparin (LOVENOX) injection  40 mg Subcutaneous Q24H   escitalopram   20 mg Oral Daily   folic acid   1 mg Oral Daily   gabapentin   600 mg Oral QHS   insulin  aspart  0-15 Units Subcutaneous TID WC   insulin  aspart  0-5 Units Subcutaneous QHS   insulin  glargine-yfgn  7 Units Subcutaneous q AM   lisinopril   40 mg  Oral Daily   multivitamin with minerals  1 tablet Oral Daily   pantoprazole   40 mg Oral Daily   rosuvastatin   40 mg Oral Daily   thiamine   100 mg Oral Daily   Or   thiamine   100 mg Intravenous Daily   Continuous Infusions:  sodium chloride  Stopped (03/11/24 0606)   cefTRIAXone  (ROCEPHIN )  IV 200 mL/hr at 03/11/24 8657    Lesa Rape, MD [facility] 03/11/2024, 11:44 AM

## 2024-03-11 NOTE — Consult Note (Signed)
 Reason for Consult: Pelvic injuries Referring Physician: Lesa Rape, MD  [name] is an 58 y.o. female.  HPI:  [name] is a 58 y.o. female with medical history significant for alcohol abuse, prior alcohol withdrawal seizure, bladder cancer status post ileal conduit being admitted to the hospital with multiple fractures after falls at home.  Apparently she has been continuing to drink, also has been taking an over-the-counter sleep aid which has been making her somnolent.  Apparently she was found by her husband on the ground yesterday evening, after she had fallen off the porch which is about a 3 foot drop.  The night before, he had found her on the floor of the garage.  Currently patient has no specific complaints, she denies any nausea, vomiting, fevers or chills.  States that her last drink of alcohol was the day before she came to the hospital, which would be 6/8.   Past Medical History:  Diagnosis Date   Allergy 07/27/2021   seasonal, environmental   Anxiety    At age of 21   Diabetes (HCC)    GERD (gastroesophageal reflux disease)    Gestational diabetes 10/02/1991   when pregnant with daughter    Hyperlipidemia    Hypertension 10/01/1997   At age of 61    Past Surgical History:  Procedure Laterality Date   ABDOMINAL HYSTERECTOMY  1999    Family History  Problem Relation Age of Onset   Hypertension Mother    Diabetes Father    Colon polyps Sister    Hypertension Sister    Hypertension Brother    Throat cancer Maternal Uncle    Colon cancer Neg Hx    Esophageal cancer Neg Hx    Rectal cancer Neg Hx    Stomach cancer Neg Hx     Social History:  reports that she quit smoking about 4 years ago. Her smoking use included cigarettes. She started smoking about 42 years ago. She has a 38 pack-year smoking history. She has been exposed to tobacco smoke. She has never used smokeless tobacco. She reports current alcohol use of about 5.0 standard drinks  of alcohol per week. She reports that she does not use drugs.  Allergies: No Known Allergies  Medications: I have reviewed the patient's current medications. Scheduled:  amLODipine   10 mg Oral QHS   carvedilol   12.5 mg Oral BID WC   chlordiazePOXIDE  25 mg Oral QID   Followed by   chlordiazePOXIDE  25 mg Oral TID   Followed by   Cecily Cohen ON 03/12/2024] chlordiazePOXIDE  25 mg Oral BH-qamhs   Followed by   Cecily Cohen ON 03/13/2024] chlordiazePOXIDE  25 mg Oral Daily   enoxaparin (LOVENOX) injection  40 mg Subcutaneous Q24H   escitalopram   20 mg Oral Daily   folic acid   1 mg Oral Daily   gabapentin   600 mg Oral QHS   insulin  aspart  0-15 Units Subcutaneous TID WC   insulin  aspart  0-5 Units Subcutaneous QHS   insulin  glargine-yfgn  7 Units Subcutaneous q AM   lisinopril   40 mg Oral Daily   multivitamin with minerals  1 tablet Oral Daily   pantoprazole   40 mg Oral Daily   rosuvastatin   40 mg Oral Daily   thiamine   100 mg Oral Daily   Or   thiamine   100 mg Intravenous Daily    Results for orders placed or performed during the hospital encounter of 03/09/24 (from the past 24 hours)  CBG  monitoring, ED     Status: Abnormal   Collection Time: 03/10/24  8:05 AM  Result Value Ref Range   Glucose-Capillary 151 (H) 70 - 99 mg/dL  CBG monitoring, ED     Status: Abnormal   Collection Time: 03/10/24 11:55 AM  Result Value Ref Range   Glucose-Capillary 140 (H) 70 - 99 mg/dL  Glucose, capillary     Status: Abnormal   Collection Time: 03/10/24  4:31 PM  Result Value Ref Range   Glucose-Capillary 100 (H) 70 - 99 mg/dL  Glucose, capillary     Status: Abnormal   Collection Time: 03/10/24  9:02 PM  Result Value Ref Range   Glucose-Capillary 121 (H) 70 - 99 mg/dL  CBC     Status: Abnormal   Collection Time: 03/11/24  4:13 AM  Result Value Ref Range   WBC 6.9 4.0 - 10.5 K/uL   RBC 3.65 (L) 3.87 - 5.11 MIL/uL   Hemoglobin 12.2 12.0 - 15.0 g/dL   HCT 29.5 18.8 - 41.6 %   MCV 105.5 (H) 80.0 -  100.0 fL   MCH 33.4 26.0 - 34.0 pg   MCHC 31.7 30.0 - 36.0 g/dL   RDW 60.6 30.1 - 60.1 %   Platelets 161 150 - 400 K/uL   nRBC 0.0 0.0 - 0.2 %     X-ray: CLINICAL DATA:  Hip trauma, fracture suspected, multiple falls. Found by husband after falling off the porch. Also fell on the ground floor last night. Right hip and leg pain.   EXAM: CT OF THE RIGHT HIP WITHOUT CONTRAST   CT PELVIS WITHOUT CONTRAST   TECHNIQUE: Multidetector CT imaging of the pelvis was performed following the standard protocol without intravenous contrast.   Multidetector CT imaging of the right hip was performed according to the standard protocol. Multiplanar CT image reconstructions were also generated.   RADIATION DOSE REDUCTION: This exam was performed according to the departmental dose-optimization program which includes automated exposure control, adjustment of the mA and/or kV according to patient size and/or use of iterative reconstruction technique.   COMPARISON:  None Available.   FINDINGS: Urinary Tract: Postoperative change of cystectomy with ileal conduit in lower quadrant.   Bowel:  Colonic diverticulosis without diverticulitis.   Vascular/Lymphatic: Arterial atherosclerotic calcification. No lymphadenopathy.   Reproductive:  Hysterectomy.   Bones/Joint/Cartilage   Acute minimally displaced fracture of the anterior right acetabulum. The fracture line extends into the joint space.   Buckle fracture of the superior pubic ramus with fracture line extending into the pubic body and pubic symphysis. Additional nondisplaced fracture of the right inferior pubic ramus.   Comminuted minimally displaced right sacral ala fracture next   Ligaments   Suboptimally assessed by CT.   Muscles and Tendons   Grossly intact.   Soft tissues   Unremarkable.   IMPRESSION: 1. Acute comminuted minimally displaced intra-articular fracture of the anterior right acetabulum. 2. Buckle  fracture of the superior pubic ramus with fracture line extending into the pubic body and pubic symphysis. Additional nondisplaced fracture of the right inferior pubic ramus. 3. Comminuted minimally displaced right sacral ala fracture.     Electronically Signed   By: Rozell Cornet M.D.  CLINICAL DATA:  Hip trauma, fracture suspected, multiple falls. Found by husband after falling off the porch. Also fell on the ground floor last night. Right hip and leg pain.   EXAM: CT OF THE RIGHT HIP WITHOUT CONTRAST   CT PELVIS WITHOUT CONTRAST   TECHNIQUE: Multidetector CT  imaging of the pelvis was performed following the standard protocol without intravenous contrast.   Multidetector CT imaging of the right hip was performed according to the standard protocol. Multiplanar CT image reconstructions were also generated.   RADIATION DOSE REDUCTION: This exam was performed according to the departmental dose-optimization program which includes automated exposure control, adjustment of the mA and/or kV according to patient size and/or use of iterative reconstruction technique.   COMPARISON:  None Available.   FINDINGS: Urinary Tract: Postoperative change of cystectomy with ileal conduit in lower quadrant.   Bowel:  Colonic diverticulosis without diverticulitis.   Vascular/Lymphatic: Arterial atherosclerotic calcification. No lymphadenopathy.   Reproductive:  Hysterectomy.   Bones/Joint/Cartilage   Acute minimally displaced fracture of the anterior right acetabulum. The fracture line extends into the joint space.   Buckle fracture of the superior pubic ramus with fracture line extending into the pubic body and pubic symphysis. Additional nondisplaced fracture of the right inferior pubic ramus.   Comminuted minimally displaced right sacral ala fracture next   Ligaments   Suboptimally assessed by CT.   Muscles and Tendons   Grossly intact.   Soft tissues    Unremarkable.   IMPRESSION: 1. Acute comminuted minimally displaced intra-articular fracture of the anterior right acetabulum. 2. Buckle fracture of the superior pubic ramus with fracture line extending into the pubic body and pubic symphysis. Additional nondisplaced fracture of the right inferior pubic ramus. 3. Comminuted minimally displaced right sacral ala fracture.     Electronically Signed   By: Rozell Cornet M.D.  ROS: As per HPI  Blood pressure 113/72, pulse 74, temperature 98.1 F (36.7 C), resp. rate 13, height 5' 2 (1.575 m), weight 51 kg, SpO2 97%.  Physical Exam: At time of my evaluation she was seen and evaluated in the hospital room.  She was resting comfortably in her bed.  She had no complaints of upper extremity pain or dysfunction. No lower extremity complaints Tenderness around her pelvis Mild discomfort with range of motion active and passive the right lower extremity No significant lower extremity edema erythema or calf tenderness Neurovascular intact distally  General:  Alert, oriented, calm, in no acute distress, resting comfortably looks chronically ill Eyes: EOMI, clear conjuctivae, white sclerea, abrasion above right eye Neck: supple, no masses, trachea mildline  Cardiovascular: RRR, no murmurs or rubs, no peripheral edema  Respiratory: clear to auscultation bilaterally, no wheezes, no crackles  Abdomen: soft, nontender, nondistended, normal bowel tones heard  Skin: dry, no rashes  Musculoskeletal: As noted above Psychiatric: appropriate affect, normal speech  Neurologic: extraocular muscles intact, clear speech, moving all extremities with intact sensorium  Assessment/Plan: 1.  Multiple fractures, nondisplaced, involving her right hip including extension to the joint anteriorly at the acetabulum.  Sacral involvement  Plan: Based on her injuries and the images I had discussed this case with Dr. Katheryne Pane, orthopedic trauma specialist, who  felt that this could be treated nonoperatively.  He recommended weightbearing as tolerated without restriction.  She should use a walker for gait assistance to help with pain with mobility. Physical therapy will be ordered with restrictions of weightbearing as tolerated with the use of a walker I would have her follow-up with Dr. Kevin Haddix in 3 to 4 weeks for clinical and radiographic follow-up to make certain that there is no displacement or change in the plan from a management standpoint.  Bevin Bucks 03/11/2024, 6:32 AM

## 2024-03-11 NOTE — Evaluation (Signed)
 Occupational Therapy Evaluation Patient Details Name: [name] MRN: [id number] DOB: 05-23-1966 Today's Date: 03/11/2024   History of Present Illness   Patient is a 58 year old female who presented after falls at home. Patient was found to have multiple fractures nondisplaced involving right hip and extends into the joint anteriorly at the acetabulum, sacral involvement. PMH; alcohol abuse, alcohol withdrawal seizure, IDDM II, hyperlipidemia, HTN,     Clinical Impressions Patient is a 58 year old female who was admitted for above. Patient was living at home with husband with independence in ADLs. Patient noted to have poor intellectual awareness of deficits with patient repeatedly asking about going on vacation that is planned with tubing and other outdoor activities. Patient as min A +2 for movement with cues for proper sequencing of task. Patient was noted to have decreased functional activity tolernace, decreased ROM, decreased BUE strength, decreased endurance, decreased sitting balance, decreased standing balanced, decreased safety awareness, and decreased knowledge of AE/AD impacting participation in ADLs. Patient would continue to benefit from skilled OT services at this time while admitted and after d/c to address noted deficits in order to improve overall safety and independence in ADLs. Patient will benefit from continued inpatient follow up therapy, <3 hours/day.      If plan is discharge home, recommend the following:   A lot of help with bathing/dressing/bathroom;Assistance with cooking/housework;Direct supervision/assist for medications management;Assist for transportation;Help with stairs or ramp for entrance;Direct supervision/assist for financial management;A lot of help with walking and/or transfers     Functional Status Assessment   Patient has had a recent decline in their functional status and demonstrates the ability to make significant improvements in  function in a reasonable and predictable amount of time.     Equipment Recommendations   Other (comment) (RW)      Precautions/Restrictions   Precautions Precautions: Fall Precaution/Restrictions Comments: monitor O2, ostomy Restrictions Weight Bearing Restrictions Per Provider Order: Yes RLE Weight Bearing Per Provider Order: Weight bearing as tolerated     Mobility Bed Mobility Overal bed mobility: Needs Assistance Bed Mobility: Supine to Sit     Supine to sit: HOB elevated, Used rails, Contact guard     General bed mobility comments: Cues for safety, technique. CGA for safety, lines.           Balance Overall balance assessment: Needs assistance, History of Falls   Sitting balance-Leahy Scale: Fair     Standing balance support: Reliant on assistive device for balance, During functional activity, Bilateral upper extremity supported Standing balance-Leahy Scale: Poor         ADL either performed or assessed with clinical judgement   ADL Overall ADL's : Needs assistance/impaired Eating/Feeding: Sitting;Set up   Grooming: Sitting;Set up   Upper Body Bathing: Sitting;Set up   Lower Body Bathing: Maximal assistance;Sitting/lateral leans   Upper Body Dressing : Sitting;Set up   Lower Body Dressing: Sitting/lateral leans;Maximal assistance   Toilet Transfer: +2 for safety/equipment;+2 for physical assistance;Minimal assistance;Rolling walker (2 wheels);Ambulation Toilet Transfer Details (indicate cue type and reason): to get from EOB to wall near door with increased time. Toileting- Clothing Manipulation and Hygiene: Sit to/from stand;Maximal assistance         General ADL Comments: no safety awareness or problem solving as noted in cog section.     Vision Baseline Vision/History: 1 Wears glasses Additional Comments: at all times            Pertinent Vitals/Pain Pain Assessment Pain Assessment: Faces Faces Pain  Scale: Hurts even more Pain  Location: R hip Pain Descriptors / Indicators: Grimacing, Constant Pain Intervention(s): Limited activity within patient's tolerance, Monitored during session, Premedicated before session, Repositioned     Extremity/Trunk Assessment Upper Extremity Assessment Upper Extremity Assessment: Defer to OT evaluation   Lower Extremity Assessment Lower Extremity Assessment: Generalized weakness   Cervical / Trunk Assessment Cervical / Trunk Assessment: Normal   Communication Communication Communication: No apparent difficulties   Cognition Arousal: Alert Behavior During Therapy: WFL for tasks assessed/performed Cognition: Cognition impaired     Awareness: Intellectual awareness impaired, Online awareness impaired   Attention impairment (select first level of impairment): Alternating attention Executive functioning impairment (select all impairments): Problem solving, Reasoning, Sequencing OT - Cognition Comments: patient reporting that she plans to continue her vacation plans for this sundy with her tubing excersion. patient was educated on how this was not safe. patietn then asking if she can get herself back to bed when she was ready. no awareness of deficits.                 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      OT Problem List: Decreased activity tolerance;Impaired balance  (sitting and/or standing);Decreased safety awareness;Decreased coordination;Decreased knowledge of precautions;Decreased knowledge of use of DME or AE;Pain;Cardiopulmonary status limiting activity   OT Treatment/Interventions: Self-care/ADL training;Energy conservation;Therapeutic exercise;DME and/or AE instruction;Therapeutic activities;Patient/family education;Balance training      OT Goals(Current goals can be found in the care plan section)   Acute Rehab OT Goals Patient Stated Goal: to go on vacation this weekend OT Goal Formulation: With patient Time For Goal Achievement: 03/25/24 Potential to Achieve Goals: Fair   OT Frequency:  Min 2X/week    Co-evaluation PT/OT/SLP Co-Evaluation/Treatment: Yes Reason for Co-Treatment: For patient/therapist safety PT goals addressed during session: Mobility/safety with mobility OT goals addressed during session: ADL's and self-care      AM-PAC OT 6 Clicks Daily Activity     Outcome Measure Help from another person eating meals?: None Help from another person taking care of personal grooming?: A Little Help from another person toileting, which includes using toliet, bedpan, or urinal?: A Lot Help from another person bathing (including washing, rinsing, drying)?: A Lot Help from another person to put on and taking off regular upper body clothing?: A Little Help from another person to put on and taking off regular lower body clothing?: A Lot 6 Click Score: 16   End of Session Equipment Utilized During Treatment: Gait belt;Rolling walker (2 wheels);Oxygen Nurse Communication: Mobility status;Other (comment) (patients poor awareness to deficits.)  Activity Tolerance: Patient tolerated treatment well Patient left: in chair;with call bell/phone within reach;with chair alarm set  OT Visit Diagnosis: Unsteadiness on feet (R26.81);Other abnormalities of gait and mobility (R26.89);Pain  Time: 5409-8119 OT Time Calculation  (min): 36 min Charges:  OT General Charges $OT Visit: 1 Visit OT Evaluation $OT Eval Low Complexity: 1 Low OT Treatments $Self Care/Home Management : 8-22 mins  Wynette Heckler, MS Acute Rehabilitation Department Office# [phone]   Jame Maze 03/11/2024, 3:26 PM

## 2024-03-11 NOTE — Hospital Course (Addendum)
 58 y.o. female with medical history significant for alcohol abuse, prior alcohol withdrawal seizure, bladder cancer status post ileal conduit being admitted to the hospital with multiple fractures after falls at home.     Subjective: Patient seen and examined this morning some slight pain felt better after using pillow and able to sleep She is worried about her upcoming vacation on Sunday in [place] Overnight BP stable afebrile on nasal cannula oxygen Labs reviewed mild hyponatremia otherwise stable  Assessment and plan:  Multiple fractures, nondisplaced involving right hip and extends into the joint anteriorly at the acetabulum, sacral involvement: Seen by Dr. Bernard Brick and after discussed with Dr. Curtiss Dowdy plan is for nonoperative treatment weightbearing as tolerated without restriction walker for gait assistance, pain management PT OT and follow-up with Dr. Curtiss Dowdy in 3 to 4 weeks for clinical neurological follow-up.  Alcohol abuse: History of alcohol withdrawal seizure: last drink  was on 6/8.  On presentation hypertensive but no other signs of withdrawal Continue Librium taper thiamine  folate multivitamin along with CIWA protocol UDS+ for THC   Abnormal urinalysis from ileal conduit: ?: colonization, follow-up urine culture, on ceftriaxone  until date   IDDM T2: well-controlled at baseline, Cont ssi   Hypertension: Stable, continue Coreg , lisinopril   Hyperlipidemia: Cont crestor    Hypertension: Stable, continue home amlodipine , lisinopril , Coreg    GERD: Con omeprazole    Depression: Cont exapro

## 2024-03-11 NOTE — TOC Initial Note (Signed)
 Transition of Care [facility]) - Initial/Assessment Note    Patient Details  Name: [name] MRN: [id number] Date of Birth: June 10, 1966  Transition of Care [facility]) CM/SW Contact:    Gertha Ku, LCSW Phone Number: 03/11/2024, 12:46 PM  Clinical Narrative:                  Substance use resources are attached to pt's AVS.   Barriers to Discharge: Continued Medical Work up   Patient Goals and CMS Choice            Expected Discharge Plan and Services                                              Prior Living Arrangements/Services                       Activities of Daily Living   ADL Screening (condition at time of admission) Independently performs ADLs?: No Does the patient have a NEW difficulty with bathing/dressing/toileting/self-feeding that is expected to last >3 days?: Yes (Initiates electronic notice to provider for possible OT consult) Does the patient have a NEW difficulty with getting in/out of bed, walking, or climbing stairs that is expected to last >3 days?: Yes (Initiates electronic notice to provider for possible PT consult) Does the patient have a NEW difficulty with communication that is expected to last >3 days?: No Is the patient deaf or have difficulty hearing?: No Does the patient have difficulty seeing, even when wearing glasses/contacts?: No Does the patient have difficulty concentrating, remembering, or making decisions?: No  Permission Sought/Granted                  Emotional Assessment              Admission diagnosis:  Pelvis fracture (HCC) [S32.9XXA] Closed fracture of transverse process of lumbar vertebra, initial encounter (HCC) [S32.009A] Multiple closed fractures of pelvis with stable disruption of pelvic ring, initial encounter (HCC) [S32.810A] Patient Active Problem List   Diagnosis Date Noted   Pelvis fracture (HCC) 03/10/2024   Thrombocytopenia (HCC) 02/28/2024   Bladder cancer (HCC)  02/28/2024   Alcohol withdrawal (HCC) 02/27/2024   Nausea & vomiting 02/27/2024   Essential hypertension 12/18/2022   Bladder mass 12/18/2022   Anxiety 12/18/2022   Acute pancreatitis 12/17/2022   Chronic bilateral low back pain without sciatica 10/22/2022   Gastroesophageal reflux disease 06/29/2022   History of hysterectomy 06/29/2022   Controlled type 2 diabetes mellitus without complication, with long-term current use of insulin  (HCC) 09/19/2019   Carotid stenosis, left 09/19/2019   Acute colitis 09/15/2019   SIRS (systemic inflammatory response syndrome) (HCC) 09/15/2019   AKI (acute kidney injury) (HCC) 09/15/2019   Mixed hyperlipidemia 06/04/2017   Alcoholic hepatitis without ascites 02/23/2015   HTN (hypertension), malignant 02/21/2015   Current smoker 02/21/2015   Diabetes type 2, uncontrolled 02/21/2015   Poor dentition 02/21/2015   ETOH abuse 02/21/2015   PCP:  Adra Alanis, FNP Pharmacy:   [id number] - [place], [place] - [address] [place] [address] Phone: [phone] Fax: [phone]     Social Drivers of Health (SDOH) Social History: SDOH Screenings   Food Insecurity: No Food Insecurity (03/10/2024)  Housing: Low Risk  (03/10/2024)  Transportation Needs: No Transportation Needs (03/10/2024)  Utilities: Not At Risk (03/10/2024)  Depression (PHQ2-9): Low Risk  (01/10/2024)  Tobacco Use: Medium Risk (03/09/2024)   SDOH Interventions:     Readmission Risk Interventions     No data to display

## 2024-03-12 ENCOUNTER — Inpatient Hospital Stay (HOSPITAL_COMMUNITY)

## 2024-03-12 DIAGNOSIS — S32401A Unspecified fracture of right acetabulum, initial encounter for closed fracture: Secondary | ICD-10-CM | POA: Diagnosis not present

## 2024-03-12 LAB — BASIC METABOLIC PANEL WITH GFR
Anion gap: 9 (ref 5–15)
BUN: 18 mg/dL (ref 6–20)
CO2: 24 mmol/L (ref 22–32)
Calcium: 8.5 mg/dL — ABNORMAL LOW (ref 8.9–10.3)
Chloride: 99 mmol/L (ref 98–111)
Creatinine, Ser: 0.59 mg/dL (ref 0.44–1.00)
GFR, Estimated: >60 mL/min (ref >60)
Glucose, Bld: 126 mg/dL — ABNORMAL HIGH (ref 70–99)
Potassium: 3.7 mmol/L (ref 3.5–5.1)
Sodium: 132 mmol/L — ABNORMAL LOW (ref 135–145)

## 2024-03-12 LAB — CBC
HCT: 36.7 % (ref 36.0–46.0)
Hemoglobin: 12 g/dL (ref 12.0–15.0)
MCH: 33.8 pg (ref 26.0–34.0)
MCHC: 32.7 g/dL (ref 30.0–36.0)
MCV: 103.4 fL — ABNORMAL HIGH (ref 80.0–100.0)
Platelets: 178 10*3/uL (ref 150–400)
RBC: 3.55 MIL/uL — ABNORMAL LOW (ref 3.87–5.11)
RDW: 12.7 % (ref 11.5–15.5)
WBC: 6.3 10*3/uL (ref 4.0–10.5)
nRBC: 0 % (ref 0.0–0.2)

## 2024-03-12 LAB — D-DIMER, QUANTITATIVE: D-Dimer, Quant: 1.9 ug{FEU}/mL — ABNORMAL HIGH (ref 0.00–0.50)

## 2024-03-12 LAB — GLUCOSE, CAPILLARY
Glucose-Capillary: 110 mg/dL — ABNORMAL HIGH (ref 70–99)
Glucose-Capillary: 148 mg/dL — ABNORMAL HIGH (ref 70–99)
Glucose-Capillary: 108 mg/dL — ABNORMAL HIGH (ref 70–99)
Glucose-Capillary: 131 mg/dL — ABNORMAL HIGH (ref 70–99)

## 2024-03-12 LAB — URINE CULTURE: Culture: >=100000 — AB

## 2024-03-12 MED ORDER — SODIUM CHLORIDE 0.9 % IV BOLUS
1000.0000 mL | Freq: Once | INTRAVENOUS | Status: AC
  Administered 2024-03-12: 1000 mL via INTRAVENOUS

## 2024-03-12 MED ORDER — CEFADROXIL 500 MG PO CAPS: 1000.0000 mg | ORAL_CAPSULE | Freq: Two times a day (BID) | ORAL | Status: DC

## 2024-03-12 NOTE — Progress Notes (Addendum)
 BP repeated x 2. Hospitalist provider aware, no new orders at this time.    03/12/24 1200  Vitals  BP (!) 82/54  MAP (mmHg) (!) 63  BP Location Right Arm  BP Method Automatic  Patient Position (if appropriate) Lying  Pulse Rate 70  Pulse Rate Source Monitor  Resp 16  MEWS COLOR  MEWS Score Color Green  Oxygen Therapy  SpO2 95 %  O2 Device Nasal Cannula  O2 Flow Rate (L/min) 2 L/min  MEWS Score  MEWS Temp 0  MEWS Systolic 1  MEWS Pulse 0  MEWS RR 0  MEWS LOC 0  MEWS Score 1

## 2024-03-12 NOTE — TOC Progression Note (Signed)
 Transition of Care [facility]) - Progression Note    Patient Details  Name: [name] MRN: [id number] Date of Birth: 06/10/1966  Transition of Care [facility]) CM/SW Contact  Gertha Ku, LCSW Phone Number: 03/12/2024, 3:16 PM  Clinical Narrative:     CSW met with the pt and the pt's spouse to discuss recommendations for SNF placement. Pt declined and expressed a desire to return home. The pt also declined home health services, stating she does not have extra money to cover additional expenses. Pt is requesting a rolling walker. A referral was sent to [facility] for the walker to be delivered to the pt's room prior to discharge. The pt's husband will provide transportation home upon discharge.     Barriers to Discharge: Continued Medical Work up  Expected Discharge Plan and Services                                               Social Determinants of Health (SDOH) Interventions SDOH Screenings   Food Insecurity: No Food Insecurity (03/10/2024)  Housing: Low Risk  (03/10/2024)  Transportation Needs: No Transportation Needs (03/10/2024)  Utilities: Not At Risk (03/10/2024)  Depression (PHQ2-9): Low Risk  (01/10/2024)  Tobacco Use: Medium Risk (03/09/2024)    Readmission Risk Interventions     No data to display

## 2024-03-12 NOTE — Progress Notes (Addendum)
 Patient moved back into bed. BP reassessed. She reports feeling some better. Denies dizziness/light headedness at this time.    03/12/24 1216  Vitals  BP 97/76  MAP (mmHg) 85  BP Location Right Arm  BP Method Automatic  Patient Position (if appropriate) Lying  Pulse Rate 66  Pulse Rate Source Monitor  Resp 16  MEWS COLOR  MEWS Score Color Green  Oxygen Therapy  SpO2 97 %  O2 Device Nasal Cannula  O2 Flow Rate (L/min) 2 L/min  MEWS Score  MEWS Temp 0  MEWS Systolic 1  MEWS Pulse 0  MEWS RR 0  MEWS LOC 0  MEWS Score 1

## 2024-03-12 NOTE — Progress Notes (Addendum)
 During PT session the pt c/o dizziness and lightheadedness, she was placed in a chair. BP assessed 79/50, Oxygen saturation 85 % ra. 2 lt [place] applied, sat  now 95%. Pt is c/o generalized pain and anxiety. Hospitalist Provider contacted to assist.

## 2024-03-12 NOTE — Progress Notes (Signed)
 Physical Therapy Treatment Patient Details Name: [name] MRN: [id number] DOB: 05/02/1966 Today's Date: 03/12/2024   History of Present Illness Patient is a 58 year old female who presented after falls at home. Patient was found to have multiple fractures nondisplaced involving right hip and extends into the joint anteriorly at the acetabulum, sacral involvement. PMH; alcohol abuse, alcohol withdrawal seizure, IDDM II, hyperlipidemia, HTN,    PT Comments  Pt seen for PT tx with pt agreeable. Pt still motivated/eager to go on tubing trip this weekend despite this PT advising her against it & trying to bring to light issues pt will have if she attempts. Pt is able to complete bed mobility with HOB fully elevated, extra time to move BLE to EOB. Pt is able to complete sit>stand with CGA & ambulate into hallway with RW & CGA<>supervision. Pt continues to ambulate with step-to pattern vs step-through pattern & pushing RW too far out in front of her. Pt c/o dizziness with gait, so assisted pt back to room via w/c. Pt noted to have drop in BP & O2, nurse made aware & called to room. Pt left in care of nurse. Will continue to follow pt acutely to progress mobility as able.  SpO2 85% after gait on room air, pt placed on 2L/min via nasal cannula with SpO2 >90% with cuing for pursed lip breathing. Sitting in chair with BLE elevated, BP in RUE: 73/49 mmHg MAP 57 Reclined head of chair, BP rechecked in RUE: 79/50 mmHg MAP 60    If plan is discharge home, recommend the following: A little help with walking and/or transfers;A little help with bathing/dressing/bathroom;Assistance with cooking/housework;Assist for transportation;Help with stairs or ramp for entrance   Can travel by private vehicle     Yes  Equipment Recommendations  Rolling walker (2 wheels)    Recommendations for Other Services       Precautions / Restrictions Precautions Precautions: Fall Precaution/Restrictions Comments:  monitor O2, BP Restrictions Weight Bearing Restrictions Per Provider Order: Yes RLE Weight Bearing Per Provider Order: Weight bearing as tolerated     Mobility  Bed Mobility Overal bed mobility: Needs Assistance Bed Mobility: Supine to Sit     Supine to sit: Supervision, HOB elevated, Used rails (HOB significantly elevated)          Transfers Overall transfer level: Needs assistance Equipment used: Rolling walker (2 wheels) Transfers: Sit to/from Stand Sit to Stand: Contact guard assist           General transfer comment: sit>stand from EOB with min cuing re: hand placement    Ambulation/Gait Ambulation/Gait assistance: Contact guard assist, Supervision Gait Distance (Feet): 50 Feet Assistive device: Rolling walker (2 wheels) Gait Pattern/deviations: Decreased step length - right, Decreased step length - left, Decreased stride length, Decreased dorsiflexion - right, Step-to pattern Gait velocity: decreased     General Gait Details: PT encouraging step through pattern but pt slow pt progress to this. A few standing rest breaks 2/2 fatigue. Cuing to use BUE on RW to offload RLE when stepping with LLE with good return demo. PT educates pt not to push RW too far out in front of her with pt requiring ongoing cuing.   Stairs             Wheelchair Mobility     Tilt Bed    Modified Rankin (Stroke Patients Only)       Balance Overall balance assessment: Needs assistance, History of Falls Sitting-balance support: Feet supported, Bilateral upper  extremity supported Sitting balance-Leahy Scale: Fair     Standing balance support: Bilateral upper extremity supported, Reliant on assistive device for balance, During functional activity Standing balance-Leahy Scale: Fair                              Hotel manager: No apparent difficulties  Cognition Arousal: Alert Behavior During Therapy: WFL for tasks  assessed/performed   PT - Cognitive impairments: Safety/Judgement, Awareness                       PT - Cognition Comments: poor insight & awareness, pt still planning/hoping to go tubing this weekend despite PT educating her against this Following commands: Intact      Cueing Cueing Techniques: Verbal cues  Exercises      General Comments        Pertinent Vitals/Pain Pain Assessment Pain Assessment: 0-10 Pain Score: 9  Pain Location: R hip/pelvis, head Pain Descriptors / Indicators: Discomfort Pain Intervention(s): Monitored during session, Limited activity within patient's tolerance, Patient requesting pain meds-RN notified    Home Living                          Prior Function            PT Goals (current goals can now be found in the care plan section) Acute Rehab PT Goals Patient Stated Goal: to go on vacation this weekend PT Goal Formulation: With patient Time For Goal Achievement: 03/25/24 Potential to Achieve Goals: Fair Progress towards PT goals: Progressing toward goals    Frequency    Min 4X/week      PT Plan      Co-evaluation              AM-PAC PT 6 Clicks Mobility   Outcome Measure  Help needed turning from your back to your side while in a flat bed without using bedrails?: A Little Help needed moving from lying on your back to sitting on the side of a flat bed without using bedrails?: A Little Help needed moving to and from a bed to a chair (including a wheelchair)?: A Little Help needed standing up from a chair using your arms (e.g., wheelchair or bedside chair)?: A Little Help needed to walk in hospital room?: A Little Help needed climbing 3-5 steps with a railing? : A Lot 6 Click Score: 17    End of Session   Activity Tolerance:  (limited 2/2 dizziness, low BP, low O2) Patient left: in chair;with chair alarm set;with call bell/phone within reach;with nursing/sitter in room Nurse Communication: Mobility  status (BP, O2) PT Visit Diagnosis: Difficulty in walking, not elsewhere classified (R26.2);History of falling (Z91.81);Muscle weakness (generalized) (M62.81);Pain Pain - Right/Left: Right Pain - part of body: Hip     Time: 1610-9604 PT Time Calculation (min) (ACUTE ONLY): 28 min  Charges:    $Gait Training: 8-22 mins $Therapeutic Activity: 8-22 mins PT General Charges $$ ACUTE PT VISIT: 1 Visit                     Emaline Handsome, PT, DPT 03/12/24, 1:04 PM   Venetta Gill 03/12/2024, 1:00 PM

## 2024-03-12 NOTE — Progress Notes (Signed)
 PROGRESS NOTE [name]  WJX:[id number] DOB: 27-Feb-1966 DOA: 03/09/2024 PCP: Adra Alanis, FNP  Brief Narrative/Hospital Course:  58 y.o. female with medical history significant for alcohol abuse, prior alcohol withdrawal seizure, bladder cancer status post ileal conduit being admitted to the hospital with multiple fractures after falls at home.      Subjective: Lightheaded and hypotensive with ambulation today   Assessment and plan:   Multiple fractures, nondisplaced involving right hip and extends into the joint anteriorly at the acetabulum, sacral involvement: Seen by Dr. Bernard Brick and after discussed with Dr. Curtiss Dowdy plan is for nonoperative treatment weightbearing as tolerated without restriction walker for gait assistance, pain management PT OT and follow-up with Dr. Curtiss Dowdy in 3 to 4 weeks for clinical neurological follow-up. TOC consulted for snf   Alcohol abuse: History of alcohol withdrawal seizure: last drink  was on 6/8.  On presentation hypertensive but no other signs of withdrawal Continue Librium taper thiamine  folate multivitamin along with CIWA protocol. LFTs stable mild elevation. Admitted for etoh withdrawal with seizure last month. UDS+ for THC  Hypoxia No cough or dyspnea - f/u cxr and dimer - cont Talent o2   Hx bladder cancer with ileal conduit: Started on abx for abnormal ua but of course this will always be abnormal, no signs infection will discontinue abx   IDDM T2: well-controlled at baseline, Cont ssi and semglee    Hypertension: Hypotension Hypotensive yesterday and today, home bp meds were continued, no anemia or hypoglycemia, I will discontinue them, give 1 liter bolus   Hyperlipidemia: Hold statin given ongoing alcohol abuse   GERD: Con omeprazole    Depression: Cont exapro   DVT prophylaxis: enoxaparin (LOVENOX) injection 40 mg Start: 03/10/24 0800 Code Status:   Code Status: Full Code Family Communication: daughter updated  telephonically 6/12 Patient status is: Remains hospitalized because of severity of illness Level of care: Progressive   Dispo: The patient is from: home            Anticipated disposition: snf Objective: Vitals last 24 hrs: Vitals:   03/12/24 1150 03/12/24 1200 03/12/24 1208 03/12/24 1216  BP: (!) 79/50 (!) 82/54 (!) 82/54 97/76  Pulse:  70 69 66  Resp:  16  16  Temp:      TempSrc:      SpO2: (!) 85% 95% 95% 97%  Weight:      Height:        Physical Examination: General exam: alert awake, older than stated age HEENT:Oral mucosa moist, Ear/Nose WNL grossly Respiratory system: Bilaterally clear BS  Cardiovascular system: S1 & S2 +. Gastrointestinal system: Abdomen soft, ileostomy in place NT,ND,BS+ Nervous System: Alert, awake, following commands. Extremities: LE edema neg, warm extremities Skin: No rashes,warm. MSK: Normal muscle bulk/tone.   Data Reviewed: I have personally reviewed following labs and imaging studies ( see epic result tab) CBC: Recent Labs  Lab 03/09/24 0206 03/11/24 0413 03/12/24 0422  WBC 10.6* 6.9 6.3  NEUTROABS 8.5*  --   --   HGB 12.6 12.2 12.0  HCT 37.6 38.5 36.7  MCV 101.9* 105.5* 103.4*  PLT 180 161 178   CMP: Recent Labs  Lab 03/09/24 2212 03/11/24 0413 03/12/24 0422  NA 133* 132* 132*  K 3.9 3.6 3.7  CL 101 98 99  CO2 22 25 24   GLUCOSE 132* 95 126*  BUN 10 15 18   CREATININE 0.57 0.43* 0.59  CALCIUM  8.5* 8.3* 8.5*   GFR: Estimated Creatinine Clearance: 60.6 mL/min (by C-G formula based  on SCr of 0.59 mg/dL). Recent Labs  Lab 03/09/24 2212  AST 132*  ALT 89*  ALKPHOS 77  BILITOT 0.8  PROT 6.7  ALBUMIN 3.5   No results for input(s): LIPASE, AMYLASE in the last 168 hours.  Recent Labs  Lab 03/09/24 2214  AMMONIA 50*   Coagulation Profile:  Recent Labs  Lab 03/09/24 2212  INR 1.0   Unresulted Labs (From admission, onward)     Start     Ordered   03/09/24 2107  CBC with Differential  Once,   STAT         03/09/24 2107           Antimicrobials/Microbiology: Anti-infectives (From admission, onward)    Start     Dose/Rate Route Frequency Ordered Stop   03/13/24 0800  cefadroxil (DURICEF) capsule 1,000 mg        1,000 mg Oral 2 times daily 03/12/24 1113     03/11/24 0600  cefTRIAXone  (ROCEPHIN ) 1 g in sodium chloride  0.9 % 100 mL IVPB  Status:  Discontinued        1 g 200 mL/hr over 30 Minutes Intravenous Every 24 hours 03/10/24 0752 03/12/24 1113   03/10/24 0345  cefTRIAXone  (ROCEPHIN ) 1 g in sodium chloride  0.9 % 100 mL IVPB        1 g 200 mL/hr over 30 Minutes Intravenous  Once 03/10/24 0332 03/10/24 0448         Component Value Date/Time   SDES  03/09/2024 2220    URINE, RANDOM Performed at [facility], [address], [place], [place] [address]    SPECREQUEST  03/09/2024 2220    NONE Reflexed from [id number] Performed at [facility], [address], [place], [place] [address]    CULT >=100,000 COLONIES/mL ESCHERICHIA COLI (A) 03/09/2024 2220   REPTSTATUS 03/12/2024 FINAL 03/09/2024 2220    Procedures:  Medications reviewed:  Scheduled Meds:  [START ON 03/13/2024] cefadroxil  1,000 mg Oral BID   chlordiazePOXIDE  25 mg Oral BH-qamhs   Followed by   Cecily Cohen ON 03/13/2024] chlordiazePOXIDE  25 mg Oral Daily   enoxaparin (LOVENOX) injection  40 mg Subcutaneous Q24H   escitalopram   20 mg Oral Daily   folic acid   1 mg Oral Daily   gabapentin   600 mg Oral QHS   insulin  aspart  0-15 Units Subcutaneous TID WC   insulin  aspart  0-5 Units Subcutaneous QHS   insulin  glargine-yfgn  7 Units Subcutaneous q AM   multivitamin with minerals  1 tablet Oral Daily   pantoprazole   40 mg Oral Daily   rosuvastatin   40 mg Oral Daily   thiamine   100 mg Oral Daily   Or   thiamine   100 mg Intravenous Daily   vitamin B-12  100 mcg Oral Daily   Continuous Infusions:    Raymonde Calico, MD [facility] 03/12/2024, 12:27 PM

## 2024-03-13 LAB — GLUCOSE, CAPILLARY
Glucose-Capillary: 138 mg/dL — ABNORMAL HIGH (ref 70–99)
Glucose-Capillary: 131 mg/dL — ABNORMAL HIGH (ref 70–99)
Glucose-Capillary: 145 mg/dL — ABNORMAL HIGH (ref 70–99)

## 2024-03-13 MED ORDER — OXYCODONE HCL 5 MG PO TABS: 5.0000 mg | ORAL_TABLET | Freq: Four times a day (QID) | ORAL | 0 refills | Status: AC | PRN

## 2024-03-13 NOTE — Progress Notes (Signed)
 Occupational Therapy Treatment Patient Details Name: [name] MRN: [id number] DOB: 1966/09/30 Today's Date: 03/13/2024   History of present illness Patient is a 58 year old female who presented after falls at home. Patient was found to have multiple fractures nondisplaced involving right hip and extends into the joint anteriorly at the acetabulum, sacral involvement. PMH; alcohol abuse, alcohol withdrawal seizure, IDDM II, hyperlipidemia, HTN,   OT comments  The pt presented with good participation in the session. She reported moderate R hip and pelvis pain with progressive activity. She was seen for ADL instruction and recommendations on increasing safety and decreasing falls risk during self-care tasks (see ADL section below). She was able to ambulate into the bathroom using a RW, perform lower body dressing in sitting, perform a toilet transfers, and perform hand washing in standing at the sink.  Cues were provided for general safety awareness as needed. Post-acute care therapy services are recommended, however the pt declines the need for such services. Continue OT plan of care.       If plan is discharge home, recommend the following:  A little help with bathing/dressing/bathroom;A little help with walking and/or transfers;Help with stairs or ramp for entrance;Assistance with cooking/housework   Equipment Recommendations  BSC/3in1;Tub/shower bench    Recommendations for Other Services      Precautions / Restrictions Precautions Precautions: Fall Restrictions Weight Bearing Restrictions Per Provider Order: Yes RLE Weight Bearing Per Provider Order: Weight bearing as tolerated       Mobility Bed Mobility               General bed mobility comments: Pt was found seated in the bedside chair    Transfers Overall transfer level: Needs assistance Equipment used: Rolling walker (2 wheels) Transfers: Sit to/from Stand Sit to Stand: Contact guard assist                  Balance     Sitting balance-Leahy Scale: Good         Standing balance comment: CGA with RW              ADL either performed or assessed with clinical judgement   ADL Overall ADL's : Needs assistance/impaired     Grooming: Contact guard assist;Standing Grooming Details (indicate cue type and reason): The pt required light steadying assist in standing, while performing hand washing at sink level.             Lower Body Dressing: Set up;Supervision/safety;Sitting/lateral leans Lower Body Dressing Details (indicate cue type and reason): The pt demonstrated the partial figure 4 technique to doff then donn socks seated EOB. She reported some discomfort when doffing and donning her R sock. Toilet Transfer: Contact guard assist;Ambulation;Rolling walker (2 wheels);Grab bars Toilet Transfer Details (indicate cue type and reason): She ambulated to and from the bathroom in her room using a RW. She transferred onto and off the standard toilet using the grab bar for added support. OT educated her on the option for use of a toilet riser, to facilitate improved ease with toilet transfers. She stated she has access to a bedside commode she can use to place over her toilet at home.       Tub/Shower Transfer Details (indicate cue type and reason): OT recommended the pt use a tub transfer bench for tub transfers at home for added safety and decreased falls risk when transferring into and out of her tub. She stated she will obtain a tub bench.  Communication Communication Communication: No apparent difficulties   Cognition Arousal: Alert Behavior During Therapy: WFL for tasks assessed/performed       Awareness: Intellectual awareness intact            Following commands: Intact                      Pertinent Vitals/ Pain       Pain Assessment Pain Assessment: 0-10 Pain Score: 5  Pain Location: R hip/pelvis Pain Intervention(s): Limited activity  within patient's tolerance, Monitored during session   Frequency  Min 2X/week        Progress Toward Goals  OT Goals(current goals can now be found in the care plan section)  Progress towards OT goals: Progressing toward goals  Acute Rehab OT Goals Patient Stated Goal: to discharge soon and to go on her vacation to [place]  on Sunday OT Goal Formulation: With patient Time For Goal Achievement: 03/25/24 Potential to Achieve Goals: Good  Plan         AM-PAC OT 6 Clicks Daily Activity     Outcome Measure   Help from another person eating meals?: None Help from another person taking care of personal grooming?: A Little Help from another person toileting, which includes using toliet, bedpan, or urinal?: A Little Help from another person bathing (including washing, rinsing, drying)?: A Little Help from another person to put on and taking off regular upper body clothing?: A Little Help from another person to put on and taking off regular lower body clothing?: A Little 6 Click Score: 19    End of Session Equipment Utilized During Treatment: Rolling walker (2 wheels)  OT Visit Diagnosis: Unsteadiness on feet (R26.81);Other abnormalities of gait and mobility (R26.89);Pain Pain - Right/Left: Right Pain - part of body: Hip   Activity Tolerance Patient tolerated treatment well   Patient Left in chair;with call bell/phone within reach   Nurse Communication Mobility status        Time: 6045-4098 OT Time Calculation (min): 23 min  Charges: OT General Charges $OT Visit: 1 Visit OT Treatments $Self Care/Home Management : 23-37 mins     Sheralyn Dies, OTR/L 03/13/2024, 4:21 PM

## 2024-03-13 NOTE — Discharge Summary (Signed)
 Physician Discharge Summary   Patient: [name] MRN: [id number] DOB: 29-Sep-1966  Admit date:     03/09/2024  Discharge date: 03/13/24  Discharge Physician: Vernona Goods Tyresa Prindiville   PCP: Adra Alanis, FNP   Recommendations at discharge:   Follow up with Dr. Curtiss Dowdy in 3-4 weeks   Discharge Diagnoses: Principal Problem:   Pelvis fracture (HCC) Active Problems:   Current smoker   ETOH abuse   Controlled type 2 diabetes mellitus without complication, with long-term current use of insulin  (HCC)   Essential hypertension   Bladder cancer (HCC)  Resolved Problems:   * No resolved hospital problems. *  Hospital Course: 58 y.o. female with medical history significant for alcohol abuse, prior alcohol withdrawal seizure, bladder cancer status post ileal conduit being admitted to the hospital with multiple fractures after falls at home.  She was noted to have Multiple fractures, nondisplaced, involving her right hip including extension to the joint anteriorly at the acetabulum.  Sacral involvement  D/w  orthopedic trauma specialist, who felt that this could be treated nonoperatively.  He recommended weightbearing as tolerated without restriction.    She should use a walker for gait assistance to help with pain with mobility.  Physical therapy will be ordered with restrictions of weightbearing as tolerated with the use of a walker   follow-up with Dr. Kevin Haddix in 3 to 4 weeks for clinical and radiographic follow-up to make certain that there is no displacement or change in the plan from a management standpoint.      Subjective: Patient seen and examined this morning some slight pain felt better after using pillow and able to sleep She is worried about her upcoming vacation on Sunday in [place], she plans to travel in an RV.  She has been discouraged from travelling in an RV due to her multiple fractures.   Overnight BP stable afebrile on nasal cannula oxygen Labs  reviewed mild hyponatremia otherwise stable  Assessment and plan:  Multiple fractures, nondisplaced involving right hip and extends into the joint anteriorly at the acetabulum, sacral involvement: Seen by Dr. Bernard Brick and after discussed with Dr. Curtiss Dowdy plan is for nonoperative treatment weightbearing as tolerated without restriction walker for gait assistance, pain management PT OT and follow-up with Dr. Curtiss Dowdy in 3 to 4 weeks for clinical neurological follow-up.  Alcohol abuse: History of alcohol withdrawal seizure: last drink  was on 6/8.  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 Service: Hospitalist        Consultants: Ortho Procedures performed: None Disposition: Home Diet recommendation:  Regular diet DISCHARGE MEDICATION: Allergies as of 03/13/2024   No Known Allergies      Medication List     TAKE these medications    amLODipine  10 MG tablet Commonly known as: NORVASC  Take 1 tablet (10 mg total) by mouth at bedtime. To lower blood pressure   B-D ULTRAFINE III SHORT PEN 31G X 8 MM Misc Generic drug: Insulin  Pen Needle use as directed daily   Belsomra  20  MG Tabs Generic drug: Suvorexant  Take 1 tablet by mouth at bedtime.   carvedilol  12.5 MG tablet Commonly known as: COREG  Take 1 tablet (12.5 mg total) by mouth 2 (two) times daily with a meal.   diphenhydrAMINE 25 MG tablet Commonly known as: BENADRYL Take  25 mg by mouth every 6 (six) hours as needed for allergies.   escitalopram  20 MG tablet Commonly known as: LEXAPRO  Take 20 mg by mouth daily.   fluticasone  50 MCG/ACT nasal spray Commonly known as: FLONASE  Place 2 sprays into both nostrils daily.   folic acid  1 MG tablet Commonly known as: FOLVITE  Take 1 tablet (1 mg total) by mouth daily.   gabapentin  300 MG capsule Commonly known as: NEURONTIN  Take 2 capsules (600 mg total) by mouth at bedtime.   hydrOXYzine  25 MG capsule Commonly known as: VISTARIL  Take 1 capsule (25 mg total) by mouth every 8 (eight) hours as needed. What changed: reasons to take this   lisinopril  40 MG tablet Commonly known as: ZESTRIL  TAKE 1 TABLET (40 MG TOTAL) BY MOUTH DAILY TO LOWER BLOOD PRESSURE   omeprazole  20 MG capsule Commonly known as: PRILOSEC Take 1 capsule (20 mg total) by mouth daily. What changed: when to take this   ondansetron  8 MG tablet Commonly known as: ZOFRAN  Take 8 mg by mouth every 8 (eight) hours as needed for nausea or vomiting.   oxyCODONE  5 MG immediate release tablet Commonly known as: Oxy IR/ROXICODONE  Take 1 tablet (5 mg total) by mouth every 6 (six) hours as needed for up to 7 days for moderate pain (pain score 4-6).   potassium chloride  SA 20 MEQ tablet Commonly known as: KLOR-CON  M Take 2 tablets (40 mEq total) by mouth daily for 7 days.   rosuvastatin  40 MG tablet Commonly known as: CRESTOR  Take 40 mg by mouth daily. What changed: Another medication with the same name was changed. Make sure you understand how and when to take each.   rosuvastatin  40 MG tablet Commonly known as: CRESTOR  Take 1 tablet (40 mg total) by mouth daily. What changed: when to take this   thiamine  100 MG tablet Commonly known as: Vitamin B-1 Take 1 tablet (100 mg total) by mouth daily.   Toujeo  SoloStar 300 UNIT/ML Solostar Pen Generic drug: insulin  glargine (1 Unit Dial ) Inject 15 Units into the skin in the morning.    traZODone  50 MG tablet Commonly known as: DESYREL  TAKE 1 TO 2 TABLETS BY MOUTH AT BEDTIME AS NEEDED FOR INSOMNIA   True Metrix Blood Glucose Test test strip Generic drug: glucose blood Use as directed to test blood sugar 3 (three) times daily.   True Metrix Meter w/Device Kit 1 each by Does not apply route as needed.   TRUEplus Lancets 28G Misc 1 each by Does not apply route 3 (three) times daily.   vitamin B-12 100 MCG tablet Commonly known as: CYANOCOBALAMIN  Take 100 mcg by mouth daily.               Durable Medical Equipment  (From admission, onward)           Start     Ordered   03/12/24 1526  For home use only DME Walker rolling  Once       Question Answer Comment  Walker: With 5 Inch Wheels   Patient needs a walker to treat with the following condition Pelvic fracture (HCC)      03/12/24 1525  Follow-up Information     Haddix, Florentina Huntsman, MD. Schedule an appointment as soon as possible for a visit in 3 week(s).   Specialty: Orthopedic Surgery Why: X-ray follow up of pelvic injuries Contact information: [address] [place] [address] [phone]                Discharge Exam: Cleavon Curls Weights   03/09/24 2300  Weight: 51 kg     Condition at discharge: fair  The results of significant diagnostics from this hospitalization (including imaging, microbiology, ancillary and laboratory) are listed below for reference.   Imaging Studies: DG CHEST PORT 1 VIEW Result Date: 03/12/2024 CLINICAL DATA:  Hypoxia. EXAM: PORTABLE CHEST 1 VIEW COMPARISON:  Feb 27, 2024. FINDINGS: The heart size and mediastinal contours are within normal limits. Minimal left basilar subsegmental atelectasis is noted. Mild right perihilar subsegmental atelectasis is noted. The visualized skeletal structures are unremarkable. IMPRESSION: Bilateral subsegmental atelectasis as noted above. Electronically Signed   By: Rosalene Colon M.D.   On: 03/12/2024 18:05    CT PELVIS WO CONTRAST Result Date: 03/10/2024 CLINICAL DATA:  Hip trauma, fracture suspected, multiple falls. Found by husband after falling off the porch. Also fell on the ground floor last night. Right hip and leg pain. EXAM: CT OF THE RIGHT HIP WITHOUT CONTRAST CT PELVIS WITHOUT CONTRAST TECHNIQUE: Multidetector CT imaging of the pelvis was performed following the standard protocol without intravenous contrast. Multidetector CT imaging of the right hip was performed according to the standard protocol. Multiplanar CT image reconstructions were also generated. RADIATION DOSE REDUCTION: This exam was performed according to the departmental dose-optimization program which includes automated exposure control, adjustment of the mA and/or kV according to patient size and/or use of iterative reconstruction technique. COMPARISON:  None Available. FINDINGS: Urinary Tract: Postoperative change of cystectomy with ileal conduit in lower quadrant. Bowel:  Colonic diverticulosis without diverticulitis. Vascular/Lymphatic: Arterial atherosclerotic calcification. No lymphadenopathy. Reproductive:  Hysterectomy. Bones/Joint/Cartilage Acute minimally displaced fracture of the anterior right acetabulum. The fracture line extends into the joint space. Buckle fracture of the superior pubic ramus with fracture line extending into the pubic body and pubic symphysis. Additional nondisplaced fracture of the right inferior pubic ramus. Comminuted minimally displaced right sacral ala fracture next Ligaments Suboptimally assessed by CT. Muscles and Tendons Grossly intact. Soft tissues Unremarkable. IMPRESSION: 1. Acute comminuted minimally displaced intra-articular fracture of the anterior right acetabulum. 2. Buckle fracture of the superior pubic ramus with fracture line extending into the pubic body and pubic symphysis. Additional nondisplaced fracture of the right inferior pubic ramus. 3. Comminuted minimally displaced right sacral  ala fracture. Electronically Signed   By: Rozell Cornet M.D.   On: 03/10/2024 00:06   CT Hip Right Wo Contrast Result Date: 03/10/2024 CLINICAL DATA:  Hip trauma, fracture suspected, multiple falls. Found by husband after falling off the porch. Also fell on the ground floor last night. Right hip and leg pain. EXAM: CT OF THE RIGHT HIP WITHOUT CONTRAST CT PELVIS WITHOUT CONTRAST TECHNIQUE: Multidetector CT imaging of the pelvis was performed following the standard protocol without intravenous contrast. Multidetector CT imaging of the right hip was performed according to the standard protocol. Multiplanar CT image reconstructions were also generated. RADIATION DOSE REDUCTION: This exam was performed according to the departmental dose-optimization program which includes automated exposure control, adjustment of the mA and/or kV according to patient size and/or use of iterative reconstruction technique. COMPARISON:  None Available. FINDINGS: Urinary Tract: Postoperative change of cystectomy with ileal  conduit in lower quadrant. Bowel:  Colonic diverticulosis without diverticulitis. Vascular/Lymphatic: Arterial atherosclerotic calcification. No lymphadenopathy. Reproductive:  Hysterectomy. Bones/Joint/Cartilage Acute minimally displaced fracture of the anterior right acetabulum. The fracture line extends into the joint space. Buckle fracture of the superior pubic ramus with fracture line extending into the pubic body and pubic symphysis. Additional nondisplaced fracture of the right inferior pubic ramus. Comminuted minimally displaced right sacral ala fracture next Ligaments Suboptimally assessed by CT. Muscles and Tendons Grossly intact. Soft tissues Unremarkable. IMPRESSION: 1. Acute comminuted minimally displaced intra-articular fracture of the anterior right acetabulum. 2. Buckle fracture of the superior pubic ramus with fracture line extending into the pubic body and pubic symphysis. Additional nondisplaced  fracture of the right inferior pubic ramus. 3. Comminuted minimally displaced right sacral ala fracture. Electronically Signed   By: Rozell Cornet M.D.   On: 03/10/2024 00:06   CT Lumbar Spine Wo Contrast Result Date: 03/10/2024 CLINICAL DATA:  Low back pain, trauma EXAM: CT LUMBAR SPINE WITHOUT CONTRAST TECHNIQUE: Multidetector CT imaging of the lumbar spine was performed without intravenous contrast administration. Multiplanar CT image reconstructions were also generated. RADIATION DOSE REDUCTION: This exam was performed according to the departmental dose-optimization program which includes automated exposure control, adjustment of the mA and/or kV according to patient size and/or use of iterative reconstruction technique. COMPARISON:  CT abdomen pelvis 01/24/2023, CT abdomen pelvis 11/28/2023 FINDINGS: Segmentation: 5 lumbar type vertebrae. Alignment: Normal. Vertebrae: Acute nondisplaced fracture of the right L5 transverse process. Old healed right L4 transverse process fracture. Paraspinal and other soft tissues: Negative. Disc levels: Maintained. Other: Atherosclerotic plaque. Partially visualized acute displaced and comminuted fracture of the right sacral ala. Question acute nondisplaced fracture of the right iliac bone (6:57). IMPRESSION: 1. Acute nondisplaced fracture of the right L5 transverse process. 2. Partially visualized acute displaced and comminuted fracture of the right sacral ala. 3. Question acute nondisplaced fracture of the right iliac bone. 4. Please see separately dictated right hip and pelvis CT 03/09/2024. 5.  Aortic Atherosclerosis (ICD10-I70.0). Electronically Signed   By: Morgane  Naveau M.D.   On: 03/10/2024 00:05   CT Head Wo Contrast Result Date: 03/10/2024 CLINICAL DATA:  Head trauma, minor (Age >= 65y).  Trauma EXAM: CT HEAD WITHOUT CONTRAST TECHNIQUE: Contiguous axial images were obtained from the base of the skull through the vertex without intravenous contrast. RADIATION  DOSE REDUCTION: This exam was performed according to the departmental dose-optimization program which includes automated exposure control, adjustment of the mA and/or kV according to patient size and/or use of iterative reconstruction technique. COMPARISON:  CT head 02/27/2024 FINDINGS: Brain: Cerebral ventricle sizes are concordant with the degree of cerebral volume loss. No evidence of large-territorial acute infarction. No parenchymal hemorrhage. No mass lesion. No extra-axial collection. No mass effect or midline shift. No hydrocephalus. Basilar cisterns are patent. Vascular: No hyperdense vessel. Atherosclerotic calcifications are present within the cavernous internal carotid arteries. Skull: No acute fracture or focal lesion. Sinuses/Orbits: Left sphenoid sinus mucosal thickening. Paranasal sinuses and mastoid air cells are clear. The orbits are unremarkable. Other: Trace right periorbital subcutaneus soft tissue hematoma. IMPRESSION: No acute intracranial abnormality. Electronically Signed   By: Morgane  Naveau M.D.   On: 03/10/2024 00:01   DG Hip Unilat  With Pelvis 2-3 Views Right Result Date: 03/09/2024 CLINICAL DATA:  Recent fall with right hip pain, initial encounter EXAM: DG HIP (WITH OR WITHOUT PELVIS) 3V RIGHT COMPARISON:  None Available. FINDINGS: Pelvic ring is intact. No acute fracture or dislocation is noted.  No soft tissue abnormality is seen. IMPRESSION: No acute abnormality noted. Electronically Signed   By: Violeta Grey M.D.   On: 03/09/2024 21:37   US  Abdomen Limited RUQ (LIVER/GB) Result Date: 02/28/2024 CLINICAL DATA:  Elevated liver function tests EXAM: ULTRASOUND ABDOMEN LIMITED RIGHT UPPER QUADRANT COMPARISON:  CT 06/21/2023 and 01/24/2023.  Ultrasound 12/17/2022. FINDINGS: Gallbladder: No gallstones or wall thickening visualized. No sonographic Murphy sign noted by sonographer. Common bile duct: Diameter: 3 mm Liver: Diffusely echogenic hepatic parenchyma consistent with fatty  liver infiltration. Portal vein is patent on color Doppler imaging with normal direction of blood flow towards the liver. Other: Study limited by overlapping bowel gas and soft tissue. IMPRESSION: Fatty liver infiltration. No gallstones or ductal dilatation. Electronically Signed   By: Adrianna Horde M.D.   On: 02/28/2024 18:24   DG Chest Port 1 View Result Date: 02/27/2024 CLINICAL DATA:  Weakness.  Altered mental status. EXAM: PORTABLE CHEST 1 VIEW COMPARISON:  07/26/2012 FINDINGS: The cardiomediastinal contours are normal. The lungs are clear. Pulmonary vasculature is normal. No consolidation, pleural effusion, or pneumothorax. No acute osseous abnormalities are seen. IMPRESSION: No active disease. Electronically Signed   By: Chadwick Colonel M.D.   On: 02/27/2024 18:57   CT Head Wo Contrast Result Date: 02/27/2024 CLINICAL DATA:  Seizure.  Altered mental status. EXAM: CT HEAD WITHOUT CONTRAST TECHNIQUE: Contiguous axial images were obtained from the base of the skull through the vertex without intravenous contrast. RADIATION DOSE REDUCTION: This exam was performed according to the departmental dose-optimization program which includes automated exposure control, adjustment of the mA and/or kV according to patient size and/or use of iterative reconstruction technique. COMPARISON:  12/04/2006 FINDINGS: Brain: No evidence of intracranial hemorrhage, acute infarction, hydrocephalus, extra-axial collection, or mass lesion/mass effect. Mild diffuse cerebral atrophy noted. Vascular:  No hyperdense vessel or other acute findings. Skull: No evidence of fracture or other significant bone abnormality. Sinuses/Orbits:  No acute findings. Other: None. IMPRESSION: No acute intracranial abnormality. Mild cerebral atrophy. Electronically Signed   By: Marlyce Sine M.D.   On: 02/27/2024 18:41    Microbiology: Results for orders placed or performed during the hospital encounter of 03/09/24  Urine Culture     Status:  Abnormal   Collection Time: 03/09/24 10:20 PM   Specimen: Urine, Random  Result Value Ref Range Status   Specimen Description   Final    URINE, RANDOM Performed at [facility], [address] [place], [place] [address]    Special Requests   Final    NONE Reflexed from [id number] Performed at [facility], [address] [place], [place] [address]    Culture >=100,000 COLONIES/mL ESCHERICHIA COLI (A)  Final   Report Status 03/12/2024 FINAL  Final   Organism ID, Bacteria ESCHERICHIA COLI (A)  Final      Susceptibility   Escherichia coli - MIC*    AMPICILLIN <=2 SENSITIVE Sensitive     CEFAZOLIN <=4 SENSITIVE Sensitive     CEFEPIME <=0.12 SENSITIVE Sensitive     CEFTRIAXONE  <=0.25 SENSITIVE Sensitive     CIPROFLOXACIN  <=0.25 SENSITIVE Sensitive     GENTAMICIN <=1 SENSITIVE Sensitive     IMIPENEM <=0.25 SENSITIVE Sensitive     NITROFURANTOIN <=16 SENSITIVE Sensitive     TRIMETH/SULFA <=20 SENSITIVE Sensitive     AMPICILLIN/SULBACTAM <=2 SENSITIVE Sensitive     PIP/TAZO <=4 SENSITIVE Sensitive ug/mL    * >=100,000 COLONIES/mL ESCHERICHIA COLI    Labs: CBC: Recent Labs  Lab 03/09/24  0206 03/11/24 0413 03/12/24 0422  WBC 10.6* 6.9 6.3  NEUTROABS 8.5*  --   --   HGB 12.6 12.2 12.0  HCT 37.6 38.5 36.7  MCV 101.9* 105.5* 103.4*  PLT 180 161 178   Basic Metabolic Panel: Recent Labs  Lab 03/09/24 2212 03/11/24 0413 03/12/24 0422  NA 133* 132* 132*  K 3.9 3.6 3.7  CL 101 98 99  CO2 22 25 24   GLUCOSE 132* 95 126*  BUN 10 15 18   CREATININE 0.57 0.43* 0.59  CALCIUM  8.5* 8.3* 8.5*   Liver Function Tests: Recent Labs  Lab 03/09/24 2212  AST 132*  ALT 89*  ALKPHOS 77  BILITOT 0.8  PROT 6.7  ALBUMIN 3.5   CBG: Recent Labs  Lab 03/12/24 1712 03/12/24 2115 03/13/24 0640 03/13/24 0806 03/13/24 1200  GLUCAP 108* 131* 138* 131* 145*    Discharge time spent: greater than 30 minutes.  Signed: Volney Grumbles, MD [facility] 03/13/2024

## 2024-03-13 NOTE — Progress Notes (Signed)
 IV removed, belongings packed, dressed, instructions reviewed with understanding verbalized, transferred to front entrance via wheelchair.

## 2024-03-13 NOTE — Progress Notes (Signed)
 Physical Therapy Treatment Patient Details Name: [name] MRN: [id number] DOB: 04-17-1966 Today's Date: 03/13/2024   History of Present Illness Patient is a 58 year old female who presented after falls at home. Patient was found to have multiple fractures nondisplaced involving right hip and extends into the joint anteriorly at the acetabulum, sacral involvement. PMH; alcohol abuse, alcohol withdrawal seizure, IDDM II, hyperlipidemia, HTN,    PT Comments  Pt assisted with ambulating in hallway and denies any symptoms today. Pt is slow but steady with use of RW.  Pt provided with cues for safety and technique to assist with pain control.  Pt reports wanting to go on an RV trip and tubing this weekend which was discouraged by this PT for safety reasons however pt seems intent and determined to go.  Pt does report she will have assist from spouse and also another person upon d/c.    If plan is discharge home, recommend the following: A little help with walking and/or transfers;A little help with bathing/dressing/bathroom;Assistance with cooking/housework;Assist for transportation;Help with stairs or ramp for entrance   Can travel by private vehicle     Yes  Equipment Recommendations  Rolling walker (2 wheels)    Recommendations for Other Services       Precautions / Restrictions Precautions Precautions: Fall Restrictions RLE Weight Bearing Per Provider Order: Weight bearing as tolerated     Mobility  Bed Mobility Overal bed mobility: Modified Independent             General bed mobility comments: pt sitting EOB on arrival, able to return to bed without assist    Transfers Overall transfer level: Needs assistance Equipment used: Rolling walker (2 wheels) Transfers: Sit to/from Stand Sit to Stand: Contact guard assist, Supervision           General transfer comment: cues for hand placement    Ambulation/Gait Ambulation/Gait assistance: Contact guard  assist, Supervision Gait Distance (Feet): 60 Feet Assistive device: Rolling walker (2 wheels) Gait Pattern/deviations: Decreased stride length, Decreased dorsiflexion - right, Step-through pattern Gait velocity: decreased     General Gait Details: step to pattern encouraged for pain control however pt reports step through tolerable, slow but steady pace, stiff right LE until cue for allowing hip/knee flexion as tolerated during swing phase; pt denies SOB and dizziness; SPO2 93% on room air and HR 80 bpm   Stairs             Wheelchair Mobility     Tilt Bed    Modified Rankin (Stroke Patients Only)       Balance Overall balance assessment: Needs assistance, History of Falls         Standing balance support: Bilateral upper extremity supported, Reliant on assistive device for balance, During functional activity Standing balance-Leahy Scale: Poor                              Communication Communication Communication: No apparent difficulties  Cognition Arousal: Alert Behavior During Therapy: WFL for tasks assessed/performed   PT - Cognitive impairments: Safety/Judgement, Awareness                       PT - Cognition Comments: poor insight & awareness, pt still planning/hoping to go tubing and RVing this weekend despite PT advising against this Following commands: Intact      Cueing Cueing Techniques: Verbal cues  Exercises  General Comments        Pertinent Vitals/Pain Pain Assessment Pain Assessment: 0-10 Pain Score: 6  Pain Location: R hip/pelvis, head Pain Descriptors / Indicators: Discomfort, Headache Pain Intervention(s): Repositioned, Monitored during session    Home Living                          Prior Function            PT Goals (current goals can now be found in the care plan section) Progress towards PT goals: Progressing toward goals    Frequency    Min 4X/week      PT Plan       Co-evaluation              AM-PAC PT 6 Clicks Mobility   Outcome Measure  Help needed turning from your back to your side while in a flat bed without using bedrails?: A Little Help needed moving from lying on your back to sitting on the side of a flat bed without using bedrails?: A Little Help needed moving to and from a bed to a chair (including a wheelchair)?: A Little Help needed standing up from a chair using your arms (e.g., wheelchair or bedside chair)?: A Little Help needed to walk in hospital room?: A Little Help needed climbing 3-5 steps with a railing? : A Little 6 Click Score: 18    End of Session Equipment Utilized During Treatment: Gait belt Activity Tolerance: Patient tolerated treatment well Patient left: in bed;with call bell/phone within reach;with bed alarm set   PT Visit Diagnosis: Difficulty in walking, not elsewhere classified (R26.2);History of falling (Z91.81)     Time: 9562-1308 PT Time Calculation (min) (ACUTE ONLY): 17 min  Charges:    $Gait Training: 8-22 mins PT General Charges $$ ACUTE PT VISIT: 1 Visit                    Henretta Lodge PT, DPT Physical Therapist Acute Rehabilitation Services Office: [phone]    Kati L Payson 03/13/2024, 1:16 PM

## 2024-03-13 NOTE — Progress Notes (Signed)
  Progress Note   Patient: [name] ZOX:[id number] DOB: 29-May-1966 DOA: 03/09/2024     3 DOS: the patient was seen and examined on 03/13/2024   Brief hospital course: 58 y.o. female with medical history significant for alcohol abuse, prior alcohol withdrawal seizure, bladder cancer status post ileal conduit being admitted to the hospital with multiple fractures after falls at home.     Subjective: Patient seen and examined this morning she tells me that she would like to go home, she does not want to go to rehab.  States that she has a vacation coming up with patient for which and she has a good support system at home.  PT/OT notes.  Assessment and plan:  Multiple fractures, nondisplaced involving right hip and extends into the joint anteriorly at the acetabulum, sacral involvement: Seen by Dr. Bernard Brick and after discussed with Dr. Curtiss Dowdy plan is for nonoperative treatment weightbearing as tolerated without restriction walker for gait assistance, pain management PT OT and follow-up with Dr. Curtiss Dowdy in 3 to 4 weeks for clinical neurological follow-up.  Alcohol abuse: History of alcohol withdrawal seizure: last drink  was on 6/8.  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 Service: Hospitalist         Physical Exam: Vitals:   03/12/24 1608 03/12/24 1948 03/13/24 0529 03/13/24 1225  BP:  122/76 139/81 132/79  Pulse: 75 76 73  70  Resp: 20 14  15   Temp:  98.2 F (36.8 C) 98.6 F (37 C) 97.6 F (36.4 C)  TempSrc:  Oral Oral Oral  SpO2: 94% 99% 90% 93%  Weight:      Height:      General exam: alert awake, older than stated age HEENT:Oral mucosa moist, Ear/Nose WNL grossly Respiratory system: Bilaterally clear BS  Cardiovascular system: S1 & S2 +. Gastrointestinal system: Abdomen soft, ileostomy in place NT,ND,BS+ Nervous System: Alert, awake, following commands. Extremities: LE edema neg, warm extremities Skin: No rashes,warm. MSK: Normal muscle bulk/tone.   Data Reviewed:  There are no new results to review at this time.  Family Communication:   Disposition: Status is: Inpatient Remains inpatient appropriate   Planned Discharge Destination: Home with Home Health    Time spent: 35 minutes  Author: Volney Grumbles, MD 03/13/2024 12:29 PM  For on call review [email or web address].

## 2024-03-13 NOTE — Plan of Care (Signed)

## 2024-03-13 NOTE — Plan of Care (Signed)
  Problem: Education: Goal: Ability to describe self-care measures that may prevent or decrease complications (Diabetes Survival Skills Education) will improve 03/13/2024 1046 by Kerwin Peels, RN Outcome: Adequate for Discharge 03/13/2024 0757 by Kerwin Peels, RN Outcome: Progressing Goal: Individualized Educational Video(s) 03/13/2024 1046 by Kerwin Peels, RN Outcome: Adequate for Discharge 03/13/2024 0757 by Kerwin Peels, RN Outcome: Progressing   Problem: Coping: Goal: Ability to adjust to condition or change in health will improve 03/13/2024 1046 by Kerwin Peels, RN Outcome: Adequate for Discharge 03/13/2024 0757 by Kerwin Peels, RN Outcome: Progressing   Problem: Fluid Volume: Goal: Ability to maintain a balanced intake and output will improve 03/13/2024 1046 by Kerwin Peels, RN Outcome: Adequate for Discharge 03/13/2024 0757 by Kerwin Peels, RN Outcome: Progressing   Problem: Health Behavior/Discharge Planning: Goal: Ability to identify and utilize available resources and services will improve 03/13/2024 1046 by Shaunda Tipping, Elsie Halo, RN Outcome: Adequate for Discharge 03/13/2024 0757 by Kerwin Peels, RN Outcome: Progressing Goal: Ability to manage health-related needs will improve 03/13/2024 1046 by Kerwin Peels, RN Outcome: Adequate for Discharge 03/13/2024 0757 by Kerwin Peels, RN Outcome: Progressing   Problem: Metabolic: Goal: Ability to maintain appropriate glucose levels will improve Outcome: Adequate for Discharge   Problem: Nutritional: Goal: Maintenance of adequate nutrition will improve Outcome: Adequate for Discharge Goal: Progress toward achieving an optimal weight will improve Outcome: Adequate for Discharge   Problem: Skin Integrity: Goal: Risk for impaired skin integrity will decrease Outcome: Adequate for Discharge   Problem: Tissue Perfusion: Goal:  Adequacy of tissue perfusion will improve Outcome: Adequate for Discharge   Problem: Education: Goal: Knowledge of General Education information will improve Description: Including pain rating scale, medication(s)/side effects and non-pharmacologic comfort measures Outcome: Adequate for Discharge   Problem: Health Behavior/Discharge Planning: Goal: Ability to manage health-related needs will improve Outcome: Adequate for Discharge   Problem: Clinical Measurements: Goal: Ability to maintain clinical measurements within normal limits will improve Outcome: Adequate for Discharge Goal: Will remain free from infection Outcome: Adequate for Discharge Goal: Diagnostic test results will improve Outcome: Adequate for Discharge Goal: Respiratory complications will improve Outcome: Adequate for Discharge Goal: Cardiovascular complication will be avoided Outcome: Adequate for Discharge   Problem: Activity: Goal: Risk for activity intolerance will decrease Outcome: Adequate for Discharge   Problem: Nutrition: Goal: Adequate nutrition will be maintained Outcome: Adequate for Discharge   Problem: Coping: Goal: Level of anxiety will decrease Outcome: Adequate for Discharge   Problem: Elimination: Goal: Will not experience complications related to bowel motility Outcome: Adequate for Discharge Goal: Will not experience complications related to urinary retention Outcome: Adequate for Discharge   Problem: Pain Managment: Goal: General experience of comfort will improve and/or be controlled Outcome: Adequate for Discharge   Problem: Safety: Goal: Ability to remain free from injury will improve Outcome: Adequate for Discharge   Problem: Skin Integrity: Goal: Risk for impaired skin integrity will decrease Outcome: Adequate for Discharge

## 2024-03-16 ENCOUNTER — Telehealth: Payer: Self-pay

## 2024-03-16 NOTE — Transitions of Care (Post Inpatient/ED Visit) (Signed)
   03/16/2024  Name: [name] MRN: [id number] DOB: 1965/12/07  Today's TOC FU Call Status: Today's TOC FU Call Status:: Unsuccessful Call (1st Attempt) Unsuccessful Call (1st Attempt) Date: 03/16/24  Attempted to reach the patient regarding the most recent Inpatient/ED visit.  Follow Up Plan: Additional outreach attempts will be made to reach the patient to complete the Transitions of Care (Post Inpatient/ED visit) call.   Signature Germain Kohler, CMA (AAMA)  [facility] [phone]

## 2024-04-10 ENCOUNTER — Ambulatory Visit: Admitting: Family

## 2024-04-11 DIAGNOSIS — Z419 Encounter for procedure for purposes other than remedying health state, unspecified: Secondary | ICD-10-CM | POA: Diagnosis not present

## 2024-05-12 DIAGNOSIS — Z419 Encounter for procedure for purposes other than remedying health state, unspecified: Secondary | ICD-10-CM | POA: Diagnosis not present

## 2024-06-12 DIAGNOSIS — Z419 Encounter for procedure for purposes other than remedying health state, unspecified: Secondary | ICD-10-CM | POA: Diagnosis not present

## 2024-07-12 DIAGNOSIS — Z419 Encounter for procedure for purposes other than remedying health state, unspecified: Secondary | ICD-10-CM | POA: Diagnosis not present
